# Patient Record
Sex: Female | Born: 1975 | Race: White | Hispanic: No | Marital: Married | Smoking: Former smoker
Health system: Southern US, Community
[De-identification: ages and names within clinical notes are randomized; demographics above are authoritative.]

## PROBLEM LIST (undated history)

## (undated) DIAGNOSIS — F419 Anxiety disorder, unspecified: Secondary | ICD-10-CM

## (undated) DIAGNOSIS — F329 Major depressive disorder, single episode, unspecified: Secondary | ICD-10-CM

## (undated) DIAGNOSIS — T7840XA Allergy, unspecified, initial encounter: Secondary | ICD-10-CM

## (undated) DIAGNOSIS — G4700 Insomnia, unspecified: Secondary | ICD-10-CM

## (undated) DIAGNOSIS — R87619 Unspecified abnormal cytological findings in specimens from cervix uteri: Secondary | ICD-10-CM

## (undated) DIAGNOSIS — Z974 Presence of external hearing-aid: Secondary | ICD-10-CM

## (undated) DIAGNOSIS — F909 Attention-deficit hyperactivity disorder, unspecified type: Secondary | ICD-10-CM

## (undated) DIAGNOSIS — F32A Depression, unspecified: Secondary | ICD-10-CM

## (undated) HISTORY — DX: Allergy, unspecified, initial encounter: T78.40XA

## (undated) HISTORY — DX: Attention-deficit hyperactivity disorder, unspecified type: F90.9

## (undated) HISTORY — PX: TONSILLECTOMY: SUR1361

## (undated) HISTORY — DX: Depression, unspecified: F32.A

## (undated) HISTORY — PX: LEEP: SHX91

## (undated) HISTORY — DX: Unspecified abnormal cytological findings in specimens from cervix uteri: R87.619

## (undated) HISTORY — DX: Major depressive disorder, single episode, unspecified: F32.9

## (undated) HISTORY — PX: CERVICAL BIOPSY  W/ LOOP ELECTRODE EXCISION: SUR135

## (undated) HISTORY — DX: Insomnia, unspecified: G47.00

## (undated) HISTORY — DX: Anxiety disorder, unspecified: F41.9

---

## 2012-09-18 ENCOUNTER — Ambulatory Visit: Payer: Self-pay

## 2013-03-21 ENCOUNTER — Ambulatory Visit: Payer: Self-pay

## 2014-04-16 ENCOUNTER — Ambulatory Visit: Payer: Self-pay

## 2015-03-19 ENCOUNTER — Other Ambulatory Visit: Payer: Self-pay | Admitting: Family Medicine

## 2015-03-19 ENCOUNTER — Other Ambulatory Visit: Payer: Self-pay | Admitting: Unknown Physician Specialty

## 2015-03-19 DIAGNOSIS — Z1231 Encounter for screening mammogram for malignant neoplasm of breast: Secondary | ICD-10-CM

## 2015-04-21 ENCOUNTER — Ambulatory Visit
Admission: RE | Admit: 2015-04-21 | Discharge: 2015-04-21 | Disposition: A | Discharge status: Home / Self Care | Payer: BLUE CROSS/BLUE SHIELD | Source: Ambulatory Visit | Attending: Family Medicine | Admitting: Family Medicine

## 2015-04-21 DIAGNOSIS — Z1231 Encounter for screening mammogram for malignant neoplasm of breast: Secondary | ICD-10-CM | POA: Diagnosis present

## 2015-05-12 ENCOUNTER — Other Ambulatory Visit: Payer: Self-pay | Admitting: Family Medicine

## 2015-05-13 NOTE — Telephone Encounter (Signed)
Call in rx  

## 2015-06-18 ENCOUNTER — Telehealth: Payer: Self-pay | Admitting: Family Medicine

## 2015-06-18 ENCOUNTER — Other Ambulatory Visit: Payer: Self-pay | Admitting: Family Medicine

## 2015-06-18 MED ORDER — AMPHETAMINE-DEXTROAMPHET ER 5 MG PO CP24: 5.0000 mg | ORAL_CAPSULE | Freq: Every day | ORAL | Status: DC

## 2015-06-18 MED ORDER — AMPHETAMINE-DEXTROAMPHETAMINE 5 MG PO TABS: 5.0000 mg | ORAL_TABLET | Freq: Every day | ORAL | Status: DC

## 2015-06-18 NOTE — Telephone Encounter (Signed)
Needs her adderall

## 2015-06-18 NOTE — Telephone Encounter (Signed)
printed

## 2015-06-26 DIAGNOSIS — G4700 Insomnia, unspecified: Secondary | ICD-10-CM | POA: Insufficient documentation

## 2015-06-26 DIAGNOSIS — F329 Major depressive disorder, single episode, unspecified: Secondary | ICD-10-CM | POA: Insufficient documentation

## 2015-06-26 DIAGNOSIS — F32A Depression, unspecified: Secondary | ICD-10-CM | POA: Insufficient documentation

## 2015-06-29 ENCOUNTER — Encounter: Payer: Self-pay | Admitting: Family Medicine

## 2015-06-29 ENCOUNTER — Ambulatory Visit (INDEPENDENT_AMBULATORY_CARE_PROVIDER_SITE_OTHER): Payer: Managed Care, Other (non HMO) | Admitting: Family Medicine

## 2015-06-29 VITALS — BP 139/82 | HR 82 | Temp 97.9°F | Ht 67.0 in | Wt 210.0 lb

## 2015-06-29 DIAGNOSIS — F32A Depression, unspecified: Secondary | ICD-10-CM

## 2015-06-29 DIAGNOSIS — F329 Major depressive disorder, single episode, unspecified: Secondary | ICD-10-CM

## 2015-06-29 DIAGNOSIS — F909 Attention-deficit hyperactivity disorder, unspecified type: Secondary | ICD-10-CM | POA: Diagnosis not present

## 2015-06-29 MED ORDER — IBUPROFEN 800 MG PO TABS: 800.0000 mg | ORAL_TABLET | Freq: Three times a day (TID) | ORAL | Status: DC | PRN

## 2015-06-29 NOTE — Assessment & Plan Note (Signed)
The current medical regimen is effective;  continue present plan and medications.  

## 2015-06-29 NOTE — Progress Notes (Addendum)
   BP 139/82 mmHg  Pulse 82  Temp(Src) 97.9 F (36.6 C)  Ht  (1.702 m)  Wt 210 lb (95.255 kg)  BMI 32.88 kg/m2  SpO2 99%  LMP 06/22/2015   Subjective:    Patient ID: Tammy Mays, female    DOB: Mar 16, 1976, 39 y.o.   MRN: [id number]  HPI: Tammy Mays is a 39 y.o. female  Chief Complaint  Patient presents with  . Depression  . ADHD  . spot on nose   Patient follow-up ADHD doing well said change of lifestyle was taking medications on a when necessary basis but now is taking on a regular basis. Is able to sleep okay. Using lorazepam on a when necessary basis has had a lot going on in her life recently and has been taking almost every night. Also having PMS symptoms pretty much predictably for 1 week of her period each month. Very emotional Rest of the month does fine.  Relevant past medical, surgical, family and social history reviewed and updated as indicated. Interim medical history since our last visit reviewed. Allergies and medications reviewed and updated.  Review of Systems  Constitutional: Negative.   Respiratory: Negative.   Cardiovascular: Negative.     Per HPI unless specifically indicated above     Objective:    BP 139/82 mmHg  Pulse 82  Temp(Src) 97.9 F (36.6 C)  Ht  (1.702 m)  Wt 210 lb (95.255 kg)  BMI 32.88 kg/m2  SpO2 99%  LMP 06/22/2015  Wt Readings from Last 3 Encounters:  06/29/15 210 lb (95.255 kg)  02/02/15 231 lb (104.781 kg)    Physical Exam  Constitutional: She is oriented to person, place, and time. She appears well-developed and well-nourished. No distress.  HENT:  Head: Normocephalic and atraumatic.  Right Ear: Hearing normal.  Left Ear: Hearing normal.  Nose: Nose normal.  Eyes: Conjunctivae and lids are normal. Right eye exhibits no discharge. Left eye exhibits no discharge. No scleral icterus.  Cardiovascular: Normal rate, regular rhythm and normal heart sounds.   Pulmonary/Chest: Effort normal and breath sounds  normal. No respiratory distress.  Musculoskeletal: Normal range of motion.  Neurological: She is alert and oriented to person, place, and time.  Skin: Skin is intact. No rash noted.  Psychiatric: She has a normal mood and affect. Her speech is normal and behavior is normal. Judgment and thought content normal. Cognition and memory are normal.    No results found for this or any previous visit.    Assessment & Plan:   Problem List Items Addressed This Visit      Other   Depression - Primary    The current medical regimen is effective;  continue present plan and medications.       Attention deficit hyperactivity disorder    The current medical regimen is effective;  continue present plan and medications.           Follow up plan: Return in about 3 months (around 09/29/2015), or if symptoms worsen or fail to improve, for f/u meds.

## 2015-06-29 NOTE — Addendum Note (Signed)
Addended byVonita Moss on: 06/29/2015 09:27 AM   Modules accepted: Orders

## 2015-07-03 ENCOUNTER — Other Ambulatory Visit: Payer: Self-pay | Admitting: Unknown Physician Specialty

## 2015-07-03 MED ORDER — AMPHETAMINE-DEXTROAMPHET ER 5 MG PO CP24: 5.0000 mg | ORAL_CAPSULE | Freq: Every day | ORAL | Status: DC

## 2015-07-03 MED ORDER — AMPHETAMINE-DEXTROAMPHETAMINE 5 MG PO TABS: 5.0000 mg | ORAL_TABLET | Freq: Every day | ORAL | Status: DC

## 2015-08-10 ENCOUNTER — Other Ambulatory Visit: Payer: Self-pay | Admitting: Family Medicine

## 2015-08-10 MED ORDER — AMPHETAMINE-DEXTROAMPHETAMINE 5 MG PO TABS: 5.0000 mg | ORAL_TABLET | Freq: Every day | ORAL | Status: DC

## 2015-08-10 MED ORDER — AMPHETAMINE-DEXTROAMPHET ER 5 MG PO CP24: 5.0000 mg | ORAL_CAPSULE | Freq: Every day | ORAL | Status: DC

## 2015-08-19 ENCOUNTER — Other Ambulatory Visit: Payer: Self-pay | Admitting: Family Medicine

## 2015-08-19 MED ORDER — LORAZEPAM 1 MG PO TABS: 1.0000 mg | ORAL_TABLET | Freq: Every day | ORAL | Status: DC | PRN

## 2015-09-08 ENCOUNTER — Encounter: Payer: Self-pay | Admitting: Sports Medicine

## 2015-09-08 ENCOUNTER — Ambulatory Visit (INDEPENDENT_AMBULATORY_CARE_PROVIDER_SITE_OTHER): Payer: 59

## 2015-09-08 ENCOUNTER — Other Ambulatory Visit: Payer: Self-pay | Admitting: Family Medicine

## 2015-09-08 ENCOUNTER — Ambulatory Visit (INDEPENDENT_AMBULATORY_CARE_PROVIDER_SITE_OTHER): Payer: 59 | Admitting: Sports Medicine

## 2015-09-08 DIAGNOSIS — M722 Plantar fascial fibromatosis: Secondary | ICD-10-CM

## 2015-09-08 DIAGNOSIS — M79673 Pain in unspecified foot: Secondary | ICD-10-CM

## 2015-09-08 MED ORDER — AMPHETAMINE-DEXTROAMPHETAMINE 5 MG PO TABS: 5.0000 mg | ORAL_TABLET | Freq: Every day | ORAL | Status: DC

## 2015-09-08 MED ORDER — AMPHETAMINE-DEXTROAMPHET ER 5 MG PO CP24: 5.0000 mg | ORAL_CAPSULE | Freq: Every day | ORAL | Status: DC

## 2015-09-08 NOTE — Progress Notes (Signed)
Patient ID: Tammy Mays, female   DOB: 11/05/1975, 39 y.o.   MRN: [id number] Subjective: Tammy Mays is a 39 y.o. female patient presents to office with complaint of heel pain on the left>right. Patient admits to post static dyskinesia for 8 weeks in duration gradually after training run. Patient has treated this problem with boots and superfeet. Patient reports that this treatment has helped. Noticed that with her new brooks sneakers the pain is made worse; thinks that these sneakers may have started this after the run. States that pain isn't too bad; able to work with no problems however pain in occasional pain in the morning. Denies any other pedal complaints. Denies hip, knee, back problems.   Patient Active Problem List   Diagnosis Date Noted  . Attention deficit hyperactivity disorder 06/29/2015  . Depression   . Insomnia    Current Outpatient Prescriptions on File Prior to Visit  Medication Sig Dispense Refill  . buPROPion (WELLBUTRIN SR) 150 MG 12 hr tablet TK 1 T PO BID  2  . ibuprofen (ADVIL,MOTRIN) 800 MG tablet Take 1 tablet (800 mg total) by mouth every 8 (eight) hours as needed. 90 tablet 3  . LORazepam (ATIVAN) 1 MG tablet Take 1 tablet (1 mg total) by mouth daily as needed. 30 tablet 2   No current facility-administered medications on file prior to visit.   Allergies  Allergen Reactions  . Effexor [Venlafaxine]      Objective: Physical Exam General: The patient is alert and oriented x3 in no acute distress.  Dermatology: Skin is warm, dry and supple bilateral lower extremities. Nails 1-10 are normal. There is no erythema, edema, no eccymosis, no open lesions present. Integument is otherwise unremarkable.  Vascular: Dorsalis Pedis pulse and Posterior Tibial pulse are 2/4 bilateral. Capillary fill time is immediate to all digits.  Neurological: Grossly intact to light touch with an achilles reflex of +2/5 and a  negative Tinel's sign bilateral.  Musculoskeletal:  Tenderness to palpation at the medial calcaneal tubercale and through the insertion of the plantar fascia on the left>right foot. No pain with compression of calcaneus bilateral. No pain with tuning fork to calcaneus bilateral. No pain with calf compression bilateral. There is mild decreased Ankle joint range of motion left>right. All other joints range of motion within normal limits bilateral. Strength 5/5 in all groups bilateral.   Gait: Unassisted, Non-Antalgic  Xray, Right & Left foot: 3 Views Normal osseous mineralization. Joint spaces preserved. No fracture/dislocation/boney destruction. Calcaneal spur present with mild thickening of plantar fascia L>R. No other soft tissue abnormalities or radiopaque foreign bodies.   Assessment and Plan: Problem List Items Addressed This Visit    None    Visit Diagnoses    Foot pain, unspecified laterality    -  Primary    Relevant Orders    DG Foot Complete Left    DG Foot Complete Right    Plantar fasciitis, bilateral        L>R      -Complete examination performed. Discussed with patient in detail the condition of plantar fasciitis, how this occurs and general treatment options. Explained both conservative and surgical treatments.  -Patient declined steroid injection at today's visit.  -Patient declined oral steroids and anti-inflammatories at today's visit. Patient desires to continue with Motrin as needed.  -Recommended good supportive shoes and advised use of OTC insert in all shoes. Explained to patient that if these orthoses work well, we will continue with these. If these do  not improve her condition and  pain, we will consider custom molded orthoses. -Explained in detail the use of the fascial brace &  night splint which was dispensed at today's visit. -Explained and dispensed to patient daily stretching exercises. -Recommend patient to ice affected area 1-2x daily. -Patient to return to office in 3-4 weeks for follow up or sooner if  problems or questions  Arise.  Asencion Islam, DPM

## 2015-09-08 NOTE — Patient Instructions (Signed)

## 2015-09-10 ENCOUNTER — Ambulatory Visit (INDEPENDENT_AMBULATORY_CARE_PROVIDER_SITE_OTHER): Payer: Managed Care, Other (non HMO) | Admitting: Family Medicine

## 2015-09-10 ENCOUNTER — Encounter: Payer: Self-pay | Admitting: Family Medicine

## 2015-09-10 VITALS — BP 131/87 | HR 73 | Temp 98.3°F | Ht 67.3 in | Wt 237.0 lb

## 2015-09-10 DIAGNOSIS — F909 Attention-deficit hyperactivity disorder, unspecified type: Secondary | ICD-10-CM

## 2015-09-10 DIAGNOSIS — F32A Depression, unspecified: Secondary | ICD-10-CM

## 2015-09-10 DIAGNOSIS — F329 Major depressive disorder, single episode, unspecified: Secondary | ICD-10-CM | POA: Diagnosis not present

## 2015-09-10 MED ORDER — BUPROPION HCL ER (SR) 150 MG PO TB12: 150.0000 mg | ORAL_TABLET | Freq: Two times a day (BID) | ORAL | Status: DC

## 2015-09-10 NOTE — Assessment & Plan Note (Signed)
The current medical regimen is effective;  continue present plan and medications.  

## 2015-09-10 NOTE — Progress Notes (Signed)
   BP 131/87 mmHg  Pulse 73  Temp(Src) 98.3 F (36.8 C)  Ht 5' 7.3" (1.709 m)  Wt 237 lb (107.502 kg)  BMI 36.81 kg/m2  LMP 09/05/2015 (Approximate)   Subjective:    Patient ID: Tammy Mays, female    DOB: [birth year], 39 y.o.   MRN: [id number]  HPI: Tammy Mays is a 39 y.o. female  Chief Complaint  Patient presents with  . ADHD  . Depression   patient doing so much better started an exercise program on a regular basis is sleeping better more energy and less anxiety Continuing to take Adderall 5 mg each morning Rare use of lorazepam Depression doing well on bupropion twice a day Has not made anxiety worse actually is much better.  Relevant past medical, surgical, family and social history reviewed and updated as indicated. Interim medical history since our last visit reviewed. Allergies and medications reviewed and updated.  Review of Systems  Constitutional: Negative.   Respiratory: Negative.   Cardiovascular: Negative.     Per HPI unless specifically indicated above     Objective:    BP 131/87 mmHg  Pulse 73  Temp(Src) 98.3 F (36.8 C)  Ht 5' 7.3" (1.709 m)  Wt 237 lb (107.502 kg)  BMI 36.81 kg/m2  LMP 09/05/2015 (Approximate)  Wt Readings from Last 3 Encounters:  09/10/15 237 lb (107.502 kg)  06/29/15 210 lb (95.255 kg)  02/02/15 231 lb (104.781 kg)    Physical Exam  Constitutional: She is oriented to person, place, and time. She appears well-developed and well-nourished. No distress.  HENT:  Head: Normocephalic and atraumatic.  Right Ear: Hearing normal.  Left Ear: Hearing normal.  Nose: Nose normal.  Eyes: Conjunctivae and lids are normal. Right eye exhibits no discharge. Left eye exhibits no discharge. No scleral icterus.  Cardiovascular: Normal rate, regular rhythm and normal heart sounds.   Pulmonary/Chest: Effort normal and breath sounds normal. No respiratory distress.  Musculoskeletal: Normal range of motion.  Neurological: She is alert and  oriented to person, place, and time.  Skin: Skin is intact. No rash noted.  Psychiatric: She has a normal mood and affect. Her speech is normal and behavior is normal. Judgment and thought content normal. Cognition and memory are normal.    No results found for this or any previous visit.    Assessment & Plan:   Problem List Items Addressed This Visit      Other   Depression - Primary    The current medical regimen is effective;  continue present plan and medications.       Relevant Medications   buPROPion (WELLBUTRIN SR) 150 MG 12 hr tablet   Attention deficit hyperactivity disorder    The current medical regimen is effective;  continue present plan and medications.           Follow up plan: Return in about 3 months (around 12/11/2015) for med check.

## 2015-09-23 ENCOUNTER — Other Ambulatory Visit: Payer: Self-pay | Admitting: Family Medicine

## 2015-09-23 MED ORDER — AMPHETAMINE-DEXTROAMPHETAMINE 5 MG PO TABS: 5.0000 mg | ORAL_TABLET | Freq: Every day | ORAL | Status: DC

## 2015-09-23 MED ORDER — AMPHETAMINE-DEXTROAMPHET ER 5 MG PO CP24: 5.0000 mg | ORAL_CAPSULE | Freq: Every day | ORAL | Status: DC

## 2015-09-30 ENCOUNTER — Encounter: Payer: Self-pay | Admitting: Family Medicine

## 2015-09-30 ENCOUNTER — Ambulatory Visit (INDEPENDENT_AMBULATORY_CARE_PROVIDER_SITE_OTHER): Payer: Managed Care, Other (non HMO) | Admitting: Family Medicine

## 2015-09-30 VITALS — BP 144/92 | HR 63 | Temp 97.9°F | Wt 219.0 lb

## 2015-09-30 DIAGNOSIS — J069 Acute upper respiratory infection, unspecified: Secondary | ICD-10-CM | POA: Diagnosis not present

## 2015-09-30 DIAGNOSIS — J208 Acute bronchitis due to other specified organisms: Secondary | ICD-10-CM | POA: Diagnosis not present

## 2015-09-30 DIAGNOSIS — IMO0001 Reserved for inherently not codable concepts without codable children: Secondary | ICD-10-CM

## 2015-09-30 DIAGNOSIS — R030 Elevated blood-pressure reading, without diagnosis of hypertension: Secondary | ICD-10-CM | POA: Diagnosis not present

## 2015-09-30 DIAGNOSIS — B9789 Other viral agents as the cause of diseases classified elsewhere: Secondary | ICD-10-CM

## 2015-09-30 MED ORDER — BENZONATATE 100 MG PO CAPS: 100.0000 mg | ORAL_CAPSULE | Freq: Three times a day (TID) | ORAL | Status: AC | PRN

## 2015-09-30 NOTE — Progress Notes (Signed)
BP 144/92 mmHg  Pulse 63  Temp(Src) 97.9 F (36.6 C)  Wt 219 lb (99.338 kg)  SpO2 99%  LMP 09/05/2015 (Approximate)   Subjective:    Patient ID: Tammy Mays, female    DOB: 1975/12/13, 39 y.o.   MRN: [id number]  HPI: Tammy Mays is a 39 y.o. female  Chief Complaint  Patient presents with  . URI    started last week, coughing, feels horrible, maybe fever (not been checked), some congestion. The cough is the worst. sore throat.   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  Review of Systems Per HPI unless specifically indicated above     Objective:    BP 144/92 mmHg  Pulse 63  Temp(Src) 97.9 F (36.6 C)  Wt 219 lb (99.338 kg)  SpO2 99%  LMP 09/05/2015 (Approximate)  Wt Readings from Last 3 Encounters:  09/30/15 219 lb (99.338 kg)  09/10/15 237 lb (107.502 kg)  06/29/15 210 lb (95.255 kg)    Physical Exam  Constitutional: She appears well-developed and well-nourished.  Non-toxic appearance. She does not have a sickly appearance. She does not appear ill. No distress.  Fluctuating weight over last few months noted; obese  HENT:  Right Ear: Hearing, external ear and ear canal normal. Tympanic membrane is scarred. Tympanic membrane is not erythematous.  Left Ear: Hearing, external ear and ear canal  normal. Tympanic membrane is not erythematous. A middle ear effusion (scant, serous) is present.  Nose: Rhinorrhea (scant clear) present. No mucosal edema.  Mouth/Throat: Oropharynx is clear and moist and mucous membranes are normal. No oropharyngeal exudate, posterior oropharyngeal edema or posterior oropharyngeal erythema.  Cardiovascular: Normal rate and regular rhythm.   Pulmonary/Chest: Effort normal and breath sounds normal.  Lymphadenopathy:    She has cervical adenopathy (shoddy).  Skin: No rash noted.    No results found for this or any previous visit.    Assessment & Plan:   Problem List Items Addressed This Visit      Other   Elevated BP    Likely related to cough/cold medicine she is taking; see AVS; check BP, notify us if over target; try DASH guidelines       Other Visit Diagnoses    Acute viral bronchitis    -  Primary    symptomatic care, rest, hydration; antibiotics not indicated; watch for s/s of secondary infection; out of work today and tomorrow    Viral upper respiratory tract infection with cough        antibiotics not indicated; rest, hydration, see AVS; out of work today and tomorrow; tessalon perles for cough        Follow up plan: Return if symptoms worsen or fail to improve.  Meds ordered this encounter  Medications  . benzonatate (  TESSALON) 100 MG capsule    Sig: Take 1 capsule (100 mg total) by mouth 3 (three) times daily as needed for cough.    Dispense:  30 capsule    Refill:  0   An after-visit summary was printed and given to the patient at check-out.  Please see the patient instructions which may contain other information and recommendations beyond what is mentioned above in the assessment and plan.

## 2015-09-30 NOTE — Assessment & Plan Note (Signed)
Likely related to cough/cold medicine she is taking; see AVS; check BP, notify us if over target; try DASH guidelines

## 2015-09-30 NOTE — Patient Instructions (Addendum)
Tammy Mays -- please write work note, out today and tomorrow Try vitamin C (orange juice if not diabetic or vitamin C tablets) and drink green tea to help your immune system during your illness Get plenty of rest and hydration Use the new cough medicine pills as directed if you need them Watch for secondary infection and call right away with any problems Check BP at work occasionally and let us know if over 140/90 Try to use PLAIN allergy / cold medicine without the decongestant Avoid: phenylephrine, phenylpropanolamine, and pseudoephredine Try the DASH guidelines for blood pressure  DASH Eating Plan DASH stands for "Dietary Approaches to Stop Hypertension." The DASH eating plan is a healthy eating plan that has been shown to reduce high blood pressure (hypertension). Additional health benefits may include reducing the risk of type 2 diabetes mellitus, heart disease, and stroke. The DASH eating plan may also help with weight loss. WHAT DO I NEED TO KNOW ABOUT THE DASH EATING PLAN? For the DASH eating plan, you will follow these general guidelines:  Choose foods with a percent daily value for sodium of less than 5% (as listed on the food label).  Use salt-free seasonings or herbs instead of table salt or sea salt.  Check with your health care provider or pharmacist before using salt substitutes.  Eat lower-sodium products, often labeled as "lower sodium" or "no salt added."  Eat fresh foods.  Eat more vegetables, fruits, and low-fat dairy products.  Choose whole grains. Look for the word "whole" as the first word in the ingredient list.  Choose fish and skinless chicken or [place] more often than red meat. Limit fish, poultry, and meat to 6 oz (170 g) each day.  Limit sweets, desserts, sugars, and sugary drinks.  Choose heart-healthy fats.  Limit cheese to 1 oz (28 g) per day.  Eat more home-cooked food and less restaurant, buffet, and fast food.  Limit fried foods.  Cook foods using  methods other than frying.  Limit canned vegetables. If you do use them, rinse them well to decrease the sodium.  When eating at a restaurant, ask that your food be prepared with less salt, or no salt if possible. WHAT FOODS CAN I EAT? Seek help from a dietitian for individual calorie needs. Grains Whole grain or whole wheat bread. Brown rice. Whole grain or whole wheat pasta. Quinoa, bulgur, and whole grain cereals. Low-sodium cereals. Corn or whole wheat flour tortillas. Whole grain cornbread. Whole grain crackers. Low-sodium crackers. Vegetables Fresh or frozen vegetables (raw, steamed, roasted, or grilled). Low-sodium or reduced-sodium tomato and vegetable juices. Low-sodium or reduced-sodium tomato sauce and paste. Low-sodium or reduced-sodium canned vegetables.  Fruits All fresh, canned (in natural juice), or frozen fruits. Meat and Other Protein Products Ground beef (85% or leaner), grass-fed beef, or beef trimmed of fat. Skinless chicken or [place]. Ground chicken or [place]key. Pork trimmed of fat. All fish and seafood. Eggs. Dried beans, peas, or lentils. Unsalted nuts and seeds. Unsalted canned beans. Dairy Low-fat dairy products, such as skim or 1% milk, 2% or reduced-fat cheeses, low-fat ricotta or cottage cheese, or plain low-fat yogurt. Low-sodium or reduced-sodium cheeses. Fats and Oils Tub margarines without trans fats. Light or reduced-fat mayonnaise and salad dressings (reduced sodium). Avocado. Safflower, olive, or canola oils. Natural peanut or almond butter. Other Unsalted popcorn and pretzels. The items listed above may not be a complete list of recommended foods or beverages. Contact your dietitian for more options. WHAT FOODS ARE NOT RECOMMENDED? Grains White  bread. White pasta. White rice. Refined cornbread. Bagels and croissants. Crackers that contain trans fat. Vegetables Creamed or fried vegetables. Vegetables in a cheese sauce. Regular canned vegetables. Regular  canned tomato sauce and paste. Regular tomato and vegetable juices. Fruits Dried fruits. Canned fruit in light or heavy syrup. Fruit juice. Meat and Other Protein Products Fatty cuts of meat. Ribs, chicken wings, bacon, sausage, bologna, salami, chitterlings, fatback, hot dogs, bratwurst, and packaged luncheon meats. Salted nuts and seeds. Canned beans with salt. Dairy Whole or 2% milk, cream, half-and-half, and cream cheese. Whole-fat or sweetened yogurt. Full-fat cheeses or blue cheese. Nondairy creamers and whipped toppings. Processed cheese, cheese spreads, or cheese curds. Condiments Onion and garlic salt, seasoned salt, table salt, and sea salt. Canned and packaged gravies. Worcestershire sauce. Tartar sauce. Barbecue sauce. Teriyaki sauce. Soy sauce, including reduced sodium. Steak sauce. Fish sauce. Oyster sauce. Cocktail sauce. Horseradish. Ketchup and mustard. Meat flavorings and tenderizers. Bouillon cubes. Hot sauce. Tabasco sauce. Marinades. Taco seasonings. Relishes. Fats and Oils Butter, stick margarine, lard, shortening, ghee, and bacon fat. Coconut, palm kernel, or palm oils. Regular salad dressings. Other Pickles and olives. Salted popcorn and pretzels. The items listed above may not be a complete list of foods and beverages to avoid. Contact your dietitian for more information. WHERE CAN I FIND MORE INFORMATION? National Heart, Lung, and Blood Institute: [employer]   This information is not intended to replace advice given to you by your health care provider. Make sure you discuss any questions you have with your health care provider.   Document Released: 10/06/2011 Document Revised: 11/07/2014 Document Reviewed: 08/21/2013 Elsevier Interactive Patient Education [employer]

## 2015-10-06 ENCOUNTER — Other Ambulatory Visit: Payer: Self-pay | Admitting: Family Medicine

## 2015-10-06 ENCOUNTER — Ambulatory Visit: Payer: 59 | Admitting: Sports Medicine

## 2015-10-06 MED ORDER — HYDROCOD POLST-CPM POLST ER 10-8 MG/5ML PO SUER: 2.5000 mL | Freq: Two times a day (BID) | ORAL | Status: DC | PRN

## 2015-11-28 ENCOUNTER — Other Ambulatory Visit: Payer: Self-pay | Admitting: Family Medicine

## 2015-11-30 ENCOUNTER — Other Ambulatory Visit: Payer: Self-pay | Admitting: Family Medicine

## 2015-11-30 NOTE — Telephone Encounter (Signed)
Call in 

## 2015-12-01 ENCOUNTER — Other Ambulatory Visit: Payer: Self-pay | Admitting: Family Medicine

## 2015-12-01 MED ORDER — AMPHETAMINE-DEXTROAMPHET ER 5 MG PO CP24: 5.0000 mg | ORAL_CAPSULE | Freq: Every day | ORAL | Status: DC

## 2015-12-01 MED ORDER — AMPHETAMINE-DEXTROAMPHETAMINE 5 MG PO TABS: 5.0000 mg | ORAL_TABLET | Freq: Every day | ORAL | Status: DC

## 2015-12-14 ENCOUNTER — Ambulatory Visit: Payer: Managed Care, Other (non HMO) | Admitting: Family Medicine

## 2015-12-22 ENCOUNTER — Encounter: Payer: Self-pay | Admitting: Family Medicine

## 2015-12-22 ENCOUNTER — Ambulatory Visit (INDEPENDENT_AMBULATORY_CARE_PROVIDER_SITE_OTHER): Payer: Managed Care, Other (non HMO) | Admitting: Family Medicine

## 2015-12-22 ENCOUNTER — Other Ambulatory Visit: Payer: Self-pay | Admitting: Family Medicine

## 2015-12-22 VITALS — BP 131/90 | HR 66 | Temp 98.1°F | Ht 67.1 in | Wt 234.0 lb

## 2015-12-22 DIAGNOSIS — F329 Major depressive disorder, single episode, unspecified: Secondary | ICD-10-CM

## 2015-12-22 DIAGNOSIS — F32A Depression, unspecified: Secondary | ICD-10-CM

## 2015-12-22 DIAGNOSIS — G4700 Insomnia, unspecified: Secondary | ICD-10-CM

## 2015-12-22 DIAGNOSIS — F909 Attention-deficit hyperactivity disorder, unspecified type: Secondary | ICD-10-CM

## 2015-12-22 MED ORDER — AMPHETAMINE-DEXTROAMPHETAMINE 5 MG PO TABS: 5.0000 mg | ORAL_TABLET | Freq: Every day | ORAL | Status: DC

## 2015-12-22 MED ORDER — AMPHETAMINE-DEXTROAMPHET ER 5 MG PO CP24: 5.0000 mg | ORAL_CAPSULE | Freq: Every day | ORAL | Status: DC

## 2015-12-22 NOTE — Assessment & Plan Note (Signed)
The current medical regimen is effective;  continue present plan and medications.  

## 2015-12-22 NOTE — Progress Notes (Signed)
   BP 131/90 mmHg  Pulse 66  Temp(Src) 98.1 F (36.7 C)  Ht 5' 7.1" (1.704 m)  Wt 234 lb (106.142 kg)  BMI 36.56 kg/m2  SpO2 98%  LMP 11/30/2015 (Approximate)   Subjective:    Patient ID: Tammy Mays, female    DOB: [birth year], 40 y.o.   MRN: [id number]  HPI: Tammy Mays is a 40 y.o. female  Chief Complaint  Patient presents with  . Depression  . ADHD   Patient doing really well has started an exercise program which allows her to take less medication. Taking bupropion at night and doing well has occasional lorazepam Depression is well controlled along with ADHD especially with exercise program Relevant past medical, surgical, family and social history reviewed and updated as indicated. Interim medical history since our last visit reviewed. Allergies and medications reviewed and updated.  Review of Systems  Constitutional: Negative.   Respiratory: Negative.   Cardiovascular: Negative.     Per HPI unless specifically indicated above     Objective:    BP 131/90 mmHg  Pulse 66  Temp(Src) 98.1 F (36.7 C)  Ht 5' 7.1" (1.704 m)  Wt 234 lb (106.142 kg)  BMI 36.56 kg/m2  SpO2 98%  LMP 11/30/2015 (Approximate)  Wt Readings from Last 3 Encounters:  12/22/15 234 lb (106.142 kg)  09/30/15 219 lb (99.338 kg)  09/10/15 237 lb (107.502 kg)    Physical Exam  Constitutional: She is oriented to person, place, and time. She appears well-developed and well-nourished. No distress.  HENT:  Head: Normocephalic and atraumatic.  Right Ear: Hearing normal.  Left Ear: Hearing normal.  Nose: Nose normal.  Eyes: Conjunctivae and lids are normal. Right eye exhibits no discharge. Left eye exhibits no discharge. No scleral icterus.  Cardiovascular: Normal rate, regular rhythm and normal heart sounds.   Pulmonary/Chest: Effort normal and breath sounds normal. No respiratory distress.  Musculoskeletal: Normal range of motion.  Neurological: She is alert and oriented to person, place, and  time.  Skin: Skin is intact. No rash noted.  Psychiatric: She has a normal mood and affect. Her speech is normal and behavior is normal. Judgment and thought content normal. Cognition and memory are normal.    No results found for this or any previous visit.    Assessment & Plan:   Problem List Items Addressed This Visit      Other   Insomnia    The current medical regimen is effective;  continue present plan and medications.       Depression - Primary    The current medical regimen is effective;  continue present plan and medications.       Attention deficit hyperactivity disorder    The current medical regimen is effective;  continue present plan and medications.           Follow up plan: Return for Physical Exam in March.

## 2016-01-21 ENCOUNTER — Encounter: Payer: Managed Care, Other (non HMO) | Admitting: Family Medicine

## 2016-01-26 ENCOUNTER — Ambulatory Visit (INDEPENDENT_AMBULATORY_CARE_PROVIDER_SITE_OTHER): Payer: Managed Care, Other (non HMO) | Admitting: Family Medicine

## 2016-01-26 ENCOUNTER — Encounter: Payer: Self-pay | Admitting: Family Medicine

## 2016-01-26 VITALS — BP 125/84 | HR 88 | Temp 98.4°F | Ht 66.4 in | Wt 238.0 lb

## 2016-01-26 DIAGNOSIS — M722 Plantar fascial fibromatosis: Secondary | ICD-10-CM | POA: Diagnosis not present

## 2016-01-26 DIAGNOSIS — Z0000 Encounter for general adult medical examination without abnormal findings: Secondary | ICD-10-CM | POA: Diagnosis not present

## 2016-01-26 LAB — URINALYSIS, ROUTINE W REFLEX MICROSCOPIC
BILIRUBIN UA: NEGATIVE
Glucose, UA: NEGATIVE
NITRITE UA: NEGATIVE
PH UA: 6 (ref 5.0–7.5)
Protein, UA: NEGATIVE
SPEC GRAV UA: 1.02 (ref 1.005–1.030)
Urobilinogen, Ur: 0.2 mg/dL (ref 0.2–1.0)

## 2016-01-26 LAB — MICROSCOPIC EXAMINATION

## 2016-01-26 NOTE — Progress Notes (Signed)
   BP 125/84 mmHg  Pulse 88  Temp(Src) 98.4 F (36.9 C)  Ht 5' 6.4" (1.687 m)  Wt 238 lb (107.956 kg)  BMI 37.93 kg/m2  SpO2 99%  LMP 01/24/2016 (Exact Date)   Subjective:    Patient ID: Tammy Mays, female    DOB: 18-Aug-1976, 40 y.o.   MRN: [id number]  HPI: Tammy Mays is a 40 y.o. female  Chief Complaint  Patient presents with  . Annual Exam  . Foot Pain    Relevant past medical, surgical, family and social history reviewed and updated as indicated. Interim medical history since our last visit reviewed. Allergies and medications reviewed and updated.  Review of Systems  Constitutional: Negative.   HENT: Negative.   Eyes: Negative.   Respiratory: Negative.   Cardiovascular: Negative.   Gastrointestinal: Negative.   Endocrine: Negative.   Genitourinary: Negative.   Musculoskeletal: Negative.   Skin: Negative.   Allergic/Immunologic: Negative.   Neurological: Negative.   Hematological: Negative.   Psychiatric/Behavioral: Negative.     Per HPI unless specifically indicated above     Objective:    BP 125/84 mmHg  Pulse 88  Temp(Src) 98.4 F (36.9 C)  Ht 5' 6.4" (1.687 m)  Wt 238 lb (107.956 kg)  BMI 37.93 kg/m2  SpO2 99%  LMP 01/24/2016 (Exact Date)  Wt Readings from Last 3 Encounters:  01/26/16 238 lb (107.956 kg)  12/22/15 234 lb (106.142 kg)  09/30/15 219 lb (99.338 kg)    Physical Exam  Constitutional: She is oriented to person, place, and time. She appears well-developed and well-nourished.  HENT:  Head: Normocephalic and atraumatic.  Right Ear: External ear normal.  Left Ear: External ear normal.  Nose: Nose normal.  Mouth/Throat: Oropharynx is clear and moist.  Eyes: Conjunctivae and EOM are normal. Pupils are equal, round, and reactive to light.  Neck: Normal range of motion. Neck supple. Carotid bruit is not present.  Cardiovascular: Normal rate, regular rhythm and normal heart sounds.   No murmur heard. Pulmonary/Chest: Effort normal and  breath sounds normal. She exhibits no mass. Right breast exhibits no mass, no skin change and no tenderness. Left breast exhibits no mass, no skin change and no tenderness. Breasts are symmetrical.  Abdominal: Soft. Bowel sounds are normal. There is no hepatosplenomegaly.  Musculoskeletal: Normal range of motion.  Neurological: She is alert and oriented to person, place, and time.  Skin: No rash noted.  Psychiatric: She has a normal mood and affect. Her behavior is normal. Judgment and thought content normal.   patient had breast exam and Pap smear done at GYN  No results found for this or any previous visit.    Assessment & Plan:   Problem List Items Addressed This Visit      Musculoskeletal and Integument   Plantar fasciitis, bilateral    Discussed plantar fasciitis care and treatment patient's been to podiatry       Other Visit Diagnoses    Routine general medical examination at a health care facility    -  Primary    Relevant Orders    CBC with Differential/Platelet    Comprehensive metabolic panel    Lipid Panel w/o Chol/HDL Ratio    Urinalysis, Routine w reflex microscopic (not at [facility])    TSH        Follow up plan: Return in about 3 months (around 04/27/2016) for ADHD follow-up.

## 2016-01-26 NOTE — Assessment & Plan Note (Signed)
Discussed plantar fasciitis care and treatment patient's been to podiatry

## 2016-01-27 ENCOUNTER — Encounter: Payer: Self-pay | Admitting: Family Medicine

## 2016-01-27 LAB — LIPID PANEL W/O CHOL/HDL RATIO
CHOLESTEROL TOTAL: 139 mg/dL (ref 100–199)
HDL: 55 mg/dL (ref >39)
LDL CALC: 63 mg/dL (ref 0–99)
TRIGLYCERIDES: 103 mg/dL (ref 0–149)
VLDL Cholesterol Cal: 21 mg/dL (ref 5–40)

## 2016-01-27 LAB — CBC WITH DIFFERENTIAL/PLATELET
BASOS: 0 %
Basophils Absolute: 0 10*3/uL (ref 0.0–0.2)
EOS (ABSOLUTE): 0.5 10*3/uL — ABNORMAL HIGH (ref 0.0–0.4)
EOS: 6 %
HEMATOCRIT: 39.7 % (ref 34.0–46.6)
HEMOGLOBIN: 13.7 g/dL (ref 11.1–15.9)
IMMATURE GRANULOCYTES: 0 %
Immature Grans (Abs): 0 10*3/uL (ref 0.0–0.1)
LYMPHS ABS: 2.6 10*3/uL (ref 0.7–3.1)
Lymphs: 30 %
MCH: 30.6 pg (ref 26.6–33.0)
MCHC: 34.5 g/dL (ref 31.5–35.7)
MCV: 89 fL (ref 79–97)
MONOCYTES: 9 %
Monocytes Absolute: 0.8 10*3/uL (ref 0.1–0.9)
NEUTROS PCT: 55 %
Neutrophils Absolute: 4.7 10*3/uL (ref 1.4–7.0)
Platelets: 274 10*3/uL (ref 150–379)
RBC: 4.48 x10E6/uL (ref 3.77–5.28)
RDW: 13.9 % (ref 12.3–15.4)
WBC: 8.6 10*3/uL (ref 3.4–10.8)

## 2016-01-27 LAB — COMPREHENSIVE METABOLIC PANEL
A/G RATIO: 1.8 (ref 1.2–2.2)
ALBUMIN: 4.2 g/dL (ref 3.5–5.5)
ALT: 13 IU/L (ref 0–32)
AST: 15 IU/L (ref 0–40)
Alkaline Phosphatase: 70 IU/L (ref 39–117)
BUN / CREAT RATIO: 15 (ref 9–23)
BUN: 12 mg/dL (ref 6–24)
Bilirubin Total: 0.4 mg/dL (ref 0.0–1.2)
CALCIUM: 9 mg/dL (ref 8.7–10.2)
CO2: 24 mmol/L (ref 18–29)
CREATININE: 0.82 mg/dL (ref 0.57–1.00)
Chloride: 103 mmol/L (ref 96–106)
GFR, EST AFRICAN AMERICAN: 103 mL/min/{1.73_m2} (ref >59)
GFR, EST NON AFRICAN AMERICAN: 89 mL/min/{1.73_m2} (ref >59)
GLOBULIN, TOTAL: 2.4 g/dL (ref 1.5–4.5)
Glucose: 72 mg/dL (ref 65–99)
POTASSIUM: 3.9 mmol/L (ref 3.5–5.2)
SODIUM: 143 mmol/L (ref 134–144)
Total Protein: 6.6 g/dL (ref 6.0–8.5)

## 2016-01-27 LAB — TSH: TSH: 1.36 u[IU]/mL (ref 0.450–4.500)

## 2016-02-15 ENCOUNTER — Other Ambulatory Visit: Payer: Self-pay | Admitting: Family Medicine

## 2016-02-15 MED ORDER — AMPHETAMINE-DEXTROAMPHETAMINE 5 MG PO TABS: 5.0000 mg | ORAL_TABLET | Freq: Every day | ORAL | Status: DC

## 2016-02-15 MED ORDER — AMPHETAMINE-DEXTROAMPHET ER 5 MG PO CP24: 5.0000 mg | ORAL_CAPSULE | Freq: Every day | ORAL | Status: DC

## 2016-02-26 ENCOUNTER — Other Ambulatory Visit: Payer: Self-pay | Admitting: Family Medicine

## 2016-02-29 ENCOUNTER — Other Ambulatory Visit: Payer: Self-pay | Admitting: Family Medicine

## 2016-02-29 DIAGNOSIS — Z0289 Encounter for other administrative examinations: Secondary | ICD-10-CM

## 2016-02-29 NOTE — Telephone Encounter (Signed)
Fax rx

## 2016-03-01 ENCOUNTER — Telehealth: Payer: Self-pay | Admitting: Family Medicine

## 2016-03-01 NOTE — Telephone Encounter (Signed)
Pt is going to an academy and she has to have injections done and would like a call back to discuss what she can have done through the county.

## 2016-03-01 NOTE — Telephone Encounter (Signed)
Patient is having the [place] Nurse check to see if she has MMR, Meningitis, TB skin, Tdap and Hep A Vaccines  If available will have done throught the city and will bring documentation here for paperwork.  If not patient will come get vaccines here.

## 2016-03-14 ENCOUNTER — Other Ambulatory Visit: Payer: Self-pay | Admitting: Family Medicine

## 2016-03-14 MED ORDER — AMPHETAMINE-DEXTROAMPHET ER 5 MG PO CP24: 5.0000 mg | ORAL_CAPSULE | Freq: Every day | ORAL | Status: DC

## 2016-03-14 MED ORDER — AMPHETAMINE-DEXTROAMPHETAMINE 5 MG PO TABS: 5.0000 mg | ORAL_TABLET | Freq: Every day | ORAL | Status: DC

## 2016-04-06 ENCOUNTER — Other Ambulatory Visit: Payer: Self-pay | Admitting: Family Medicine

## 2016-04-06 DIAGNOSIS — Z1231 Encounter for screening mammogram for malignant neoplasm of breast: Secondary | ICD-10-CM

## 2016-04-17 ENCOUNTER — Other Ambulatory Visit: Payer: Self-pay | Admitting: Family Medicine

## 2016-04-17 MED ORDER — AMPHETAMINE-DEXTROAMPHET ER 5 MG PO CP24: 5.0000 mg | ORAL_CAPSULE | Freq: Every day | ORAL | Status: DC

## 2016-04-17 MED ORDER — AMPHETAMINE-DEXTROAMPHETAMINE 5 MG PO TABS: 5.0000 mg | ORAL_TABLET | Freq: Every day | ORAL | Status: DC

## 2016-04-20 ENCOUNTER — Other Ambulatory Visit: Payer: Self-pay | Admitting: Family Medicine

## 2016-04-20 MED ORDER — AMPHETAMINE-DEXTROAMPHET ER 5 MG PO CP24: 5.0000 mg | ORAL_CAPSULE | Freq: Every day | ORAL | Status: DC

## 2016-04-20 MED ORDER — AMPHETAMINE-DEXTROAMPHETAMINE 5 MG PO TABS: 5.0000 mg | ORAL_TABLET | Freq: Every day | ORAL | Status: DC

## 2016-04-21 ENCOUNTER — Ambulatory Visit
Admission: RE | Admit: 2016-04-21 | Discharge: 2016-04-21 | Disposition: A | Discharge status: Home / Self Care | Payer: Managed Care, Other (non HMO) | Source: Ambulatory Visit | Attending: Family Medicine | Admitting: Family Medicine

## 2016-04-21 ENCOUNTER — Other Ambulatory Visit: Payer: Self-pay | Admitting: Family Medicine

## 2016-04-21 ENCOUNTER — Ambulatory Visit (INDEPENDENT_AMBULATORY_CARE_PROVIDER_SITE_OTHER): Payer: Managed Care, Other (non HMO) | Admitting: Family Medicine

## 2016-04-21 ENCOUNTER — Encounter: Payer: Self-pay | Admitting: Family Medicine

## 2016-04-21 VITALS — BP 128/84 | HR 69 | Temp 97.9°F | Ht 65.0 in | Wt 234.0 lb

## 2016-04-21 DIAGNOSIS — G4700 Insomnia, unspecified: Secondary | ICD-10-CM

## 2016-04-21 DIAGNOSIS — F909 Attention-deficit hyperactivity disorder, unspecified type: Secondary | ICD-10-CM | POA: Diagnosis not present

## 2016-04-21 DIAGNOSIS — Z1231 Encounter for screening mammogram for malignant neoplasm of breast: Secondary | ICD-10-CM | POA: Insufficient documentation

## 2016-04-21 NOTE — Progress Notes (Signed)
BP 128/84 mmHg  Pulse 69  Temp(Src) 97.9 F (36.6 C)  Ht 5\' 5"  (1.651 m)  Wt 234 lb (106.142 kg)  BMI 38.94 kg/m2  SpO2 98%  LMP 04/11/2016 (Approximate)   Subjective:    Patient ID: Tammy Mays, female    DOB: 1976/10/17, 40 y.o.   MRN: [id number]  HPI: Tammy Mays is a 40 y.o. female  Chief Complaint  Patient presents with  . ADHD  . Insomnia   Doing well on the adderall, only taking it when needed. Denies any side effects and states the medicine is helping a lot. Denies CP or palpitations, no appetite issues.  States she has always had sleep issues and takes ativan to help with that. No change since taking the adderall.   Patient also discussed plantar fasciitis has new shoes which helps. Is bothered with sprinting. 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 Discussed potential for cortisone shots if needed.  Relevant past medical, surgical, family and social history reviewed and updated as indicated. Interim medical history since our last visit reviewed. Allergies and medications reviewed and updated.  Review of Systems  Constitutional: Negative.   Respiratory: Negative.   Cardiovascular: Negative.   Neurological: Negative.   Psychiatric/Behavioral: Negative.     Per HPI unless specifically indicated above     Objective:    BP 128/84 mmHg  Pulse 69  Temp(Src) 97.9 F (36.6 C)  Ht 5\' 5"  (1.651 m)  Wt 234 lb (106.142 kg)  BMI 38.94 kg/m2  SpO2 98%  LMP 04/11/2016 (Approximate)  Wt Readings from Last 3 Encounters:  04/21/16 234 lb (106.142 kg)  01/26/16 238 lb (107.956 kg)  12/22/15 234 lb (106.142 kg)    Physical Exam  Constitutional: She is oriented to person, place, and time. She appears well-developed and well-nourished. No distress.  HENT:  Head: Normocephalic and atraumatic.  Right Ear: Hearing normal.  Left Ear: Hearing normal.  Nose: Nose normal.  Eyes: Conjunctivae and lids are normal. Right eye exhibits no  discharge. Left eye exhibits no discharge. No scleral icterus.  Cardiovascular: Normal rate, regular rhythm and normal heart sounds.   Pulmonary/Chest: Effort normal and breath sounds normal. No respiratory distress.  Musculoskeletal: Normal range of motion.  Neurological: She is alert and oriented to person, place, and time.  Skin: Skin is intact. No rash noted.  Psychiatric: She has a normal mood and affect. Her speech is normal and behavior is normal. Judgment and thought content normal. Cognition and memory are normal.    Results for orders placed or performed in visit on 01/26/16  Microscopic Examination  Result Value Ref Range   WBC, UA 0-5 0 -  5 /hpf   RBC, UA 0-2 0 -  2 /hpf   Epithelial Cells (non renal) 0-10 0 - 10 /hpf   Mucus, UA Present Not Estab.   Bacteria, UA Few None seen/Few  CBC with Differential/Platelet  Result Value Ref Range   WBC 8.6 3.4 - 10.8 x10E3/uL   RBC 4.48 3.77 - 5.28 x10E6/uL   Hemoglobin 13.7 11.1 - 15.9 g/dL   Hematocrit 14.739.7 82.934.0 - 46.6 %   MCV 89 79 - 97 fL   MCH 30.6 26.6 - 33.0 pg   MCHC 34.5 31.5 - 35.7 g/dL   RDW 56.213.9 13.012.3 - 86.515.4 %   Platelets 274 150 - 379 x10E3/uL   Neutrophils 55 %   Lymphs 30 %   Monocytes 9 %  Eos 6 %   Basos 0 %   Neutrophils Absolute 4.7 1.4 - 7.0 x10E3/uL   Lymphocytes Absolute 2.6 0.7 - 3.1 x10E3/uL   Monocytes Absolute 0.8 0.1 - 0.9 x10E3/uL   EOS (ABSOLUTE) 0.5 (H) 0.0 - 0.4 x10E3/uL   Basophils Absolute 0.0 0.0 - 0.2 x10E3/uL   Immature Granulocytes 0 %   Immature Grans (Abs) 0.0 0.0 - 0.1 x10E3/uL  Comprehensive metabolic panel  Result Value Ref Range   Glucose 72 65 - 99 mg/dL   BUN 12 6 - 24 mg/dL   Creatinine, Ser 4.090.82 0.57 - 1.00 mg/dL   GFR calc non Af Amer 89 >59 mL/min/1.73   GFR calc Af Amer 103 >59 mL/min/1.73   BUN/Creatinine Ratio 15 9 - 23   Sodium 143 134 - 144 mmol/L   Potassium 3.9 3.5 - 5.2 mmol/L   Chloride 103 96 - 106 mmol/L   CO2 24 18 - 29 mmol/L   Calcium 9.0 8.7 - 10.2  mg/dL   Total Protein 6.6 6.0 - 8.5 g/dL   Albumin 4.2 3.5 - 5.5 g/dL   Globulin, Total 2.4 1.5 - 4.5 g/dL   Albumin/Globulin Ratio 1.8 1.2 - 2.2   Bilirubin Total 0.4 0.0 - 1.2 mg/dL   Alkaline Phosphatase 70 39 - 117 IU/L   AST 15 0 - 40 IU/L   ALT 13 0 - 32 IU/L  Lipid Panel w/o Chol/HDL Ratio  Result Value Ref Range   Cholesterol, Total 139 100 - 199 mg/dL   Triglycerides 811103 0 - 149 mg/dL   HDL 55 >91>39 mg/dL   VLDL Cholesterol Cal 21 5 - 40 mg/dL   LDL Calculated 63 0 - 99 mg/dL  Urinalysis, Routine w reflex microscopic (not at [facility])  Result Value Ref Range   Specific Gravity, UA 1.020 1.005 - 1.030   pH, UA 6.0 5.0 - 7.5   Color, UA Yellow Yellow   Appearance Ur Clear Clear   Leukocytes, UA 1+ (A) Negative   Protein, UA Negative Negative/Trace   Glucose, UA Negative Negative   Ketones, UA Trace (A) Negative   RBC, UA Trace (A) Negative   Bilirubin, UA Negative Negative   Urobilinogen, Ur 0.2 0.2 - 1.0 mg/dL   Nitrite, UA Negative Negative   Microscopic Examination See below:   TSH  Result Value Ref Range   TSH 1.360 0.450 - 4.500 uIU/mL      Assessment & Plan:   Problem List Items Addressed This Visit      Other   Insomnia    The current medical regimen is effective;  continue present plan and medications. Only taking the ativan when needed.        Attention deficit hyperactivity disorder - Primary    Doing very well on current regimen, continue taking the adderall on an as needed basis.           Follow up plan: Return in about 3 months (around 07/22/2016), or if symptoms worsen or fail to improve, for adhd check.

## 2016-04-21 NOTE — Assessment & Plan Note (Signed)
Doing very well on current regimen, continue taking the adderall on an as needed basis.

## 2016-04-21 NOTE — Assessment & Plan Note (Addendum)
The current medical regimen is effective;  continue present plan and medications. Only taking the ativan when needed.

## 2016-05-03 ENCOUNTER — Other Ambulatory Visit: Payer: Self-pay | Admitting: Family Medicine

## 2016-05-17 LAB — HM PAP SMEAR: HM Pap smear: NEGATIVE

## 2016-05-31 ENCOUNTER — Other Ambulatory Visit: Payer: Self-pay | Admitting: Family Medicine

## 2016-05-31 MED ORDER — AMPHETAMINE-DEXTROAMPHET ER 5 MG PO CP24: 5.0000 mg | ORAL_CAPSULE | Freq: Every day | ORAL | 0 refills | Status: DC

## 2016-05-31 MED ORDER — AMPHETAMINE-DEXTROAMPHETAMINE 5 MG PO TABS: 5.0000 mg | ORAL_TABLET | Freq: Every day | ORAL | 0 refills | Status: DC

## 2016-06-06 ENCOUNTER — Other Ambulatory Visit: Payer: Self-pay | Admitting: Family Medicine

## 2016-06-07 NOTE — Telephone Encounter (Signed)
Routing to provider  

## 2016-06-22 ENCOUNTER — Other Ambulatory Visit: Payer: Self-pay | Admitting: Family Medicine

## 2016-06-22 MED ORDER — AMPHETAMINE-DEXTROAMPHETAMINE 5 MG PO TABS: 5.0000 mg | ORAL_TABLET | Freq: Every day | ORAL | 0 refills | Status: DC

## 2016-06-22 MED ORDER — AMPHETAMINE-DEXTROAMPHET ER 5 MG PO CP24: 5.0000 mg | ORAL_CAPSULE | Freq: Every day | ORAL | 0 refills | Status: DC

## 2016-07-06 ENCOUNTER — Other Ambulatory Visit: Payer: Self-pay | Admitting: Family Medicine

## 2016-07-07 ENCOUNTER — Other Ambulatory Visit: Payer: Self-pay | Admitting: Family Medicine

## 2016-07-07 MED ORDER — LORAZEPAM 1 MG PO TABS: 1.0000 mg | ORAL_TABLET | Freq: Every day | ORAL | 0 refills | Status: DC | PRN

## 2016-07-18 ENCOUNTER — Encounter: Payer: Self-pay | Admitting: Family Medicine

## 2016-07-18 ENCOUNTER — Ambulatory Visit (INDEPENDENT_AMBULATORY_CARE_PROVIDER_SITE_OTHER): Payer: Managed Care, Other (non HMO) | Admitting: Family Medicine

## 2016-07-18 ENCOUNTER — Other Ambulatory Visit: Payer: Self-pay | Admitting: Family Medicine

## 2016-07-18 ENCOUNTER — Ambulatory Visit: Payer: Managed Care, Other (non HMO) | Admitting: Family Medicine

## 2016-07-18 DIAGNOSIS — G4700 Insomnia, unspecified: Secondary | ICD-10-CM | POA: Diagnosis not present

## 2016-07-18 DIAGNOSIS — F909 Attention-deficit hyperactivity disorder, unspecified type: Secondary | ICD-10-CM | POA: Diagnosis not present

## 2016-07-18 DIAGNOSIS — M722 Plantar fascial fibromatosis: Secondary | ICD-10-CM | POA: Diagnosis not present

## 2016-07-18 MED ORDER — AMPHETAMINE-DEXTROAMPHETAMINE 5 MG PO TABS: 5.0000 mg | ORAL_TABLET | Freq: Every day | ORAL | 0 refills | Status: DC

## 2016-07-18 MED ORDER — AMPHETAMINE-DEXTROAMPHET ER 5 MG PO CP24: 5.0000 mg | ORAL_CAPSULE | Freq: Every day | ORAL | 0 refills | Status: DC

## 2016-07-18 MED ORDER — MIRTAZAPINE 15 MG PO TABS: 15.0000 mg | ORAL_TABLET | Freq: Every day | ORAL | 6 refills | Status: DC

## 2016-07-18 NOTE — Assessment & Plan Note (Signed)
Reviewed insomnia care and treatment will use Remeron. Discuss limitations of lorazepam and making prescription last a long time.

## 2016-07-18 NOTE — Assessment & Plan Note (Signed)
Discussed plantar fasciitis patient will go back to podiatry to assess for injections.
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
## 2016-07-18 NOTE — Assessment & Plan Note (Signed)
ADHD stable on medications will continue current meds and care

## 2016-07-20 ENCOUNTER — Other Ambulatory Visit: Payer: Self-pay | Admitting: Family Medicine

## 2016-07-20 MED ORDER — AMPHETAMINE-DEXTROAMPHET ER 5 MG PO CP24: 5.0000 mg | ORAL_CAPSULE | Freq: Every day | ORAL | 0 refills | Status: DC

## 2016-07-20 MED ORDER — AMPHETAMINE-DEXTROAMPHETAMINE 5 MG PO TABS: 5.0000 mg | ORAL_TABLET | Freq: Every day | ORAL | 0 refills | Status: DC

## 2016-07-20 NOTE — Progress Notes (Signed)
Extra refill given on ADHD medications due to patient going out of town for a month.

## 2016-08-22 ENCOUNTER — Other Ambulatory Visit: Payer: Self-pay | Admitting: Family Medicine

## 2016-08-31 ENCOUNTER — Other Ambulatory Visit: Payer: Self-pay | Admitting: Family Medicine

## 2016-09-26 ENCOUNTER — Other Ambulatory Visit: Payer: Self-pay | Admitting: Family Medicine

## 2016-09-26 MED ORDER — AMPHETAMINE-DEXTROAMPHETAMINE 5 MG PO TABS: 5.0000 mg | ORAL_TABLET | Freq: Every day | ORAL | 0 refills | Status: DC

## 2016-10-18 ENCOUNTER — Other Ambulatory Visit: Payer: Self-pay | Admitting: Family Medicine

## 2016-10-18 MED ORDER — AMPHETAMINE-DEXTROAMPHETAMINE 5 MG PO TABS: 5.0000 mg | ORAL_TABLET | Freq: Every day | ORAL | 0 refills | Status: DC

## 2016-11-10 ENCOUNTER — Encounter: Payer: Self-pay | Admitting: Family Medicine

## 2016-11-10 ENCOUNTER — Ambulatory Visit (INDEPENDENT_AMBULATORY_CARE_PROVIDER_SITE_OTHER): Payer: Managed Care, Other (non HMO) | Admitting: Family Medicine

## 2016-11-10 VITALS — BP 138/85 | HR 99 | Temp 99.0°F | Wt 237.0 lb

## 2016-11-10 DIAGNOSIS — J101 Influenza due to other identified influenza virus with other respiratory manifestations: Secondary | ICD-10-CM

## 2016-11-10 LAB — VERITOR FLU A/B WAIVED
Influenza A: POSITIVE — AB
Influenza B: NEGATIVE

## 2016-11-10 MED ORDER — ACETAMINOPHEN 500 MG PO TABS
1000.0000 mg | ORAL_TABLET | Freq: Once | ORAL | Status: AC
  Administered 2016-11-10: 1000 mg via ORAL

## 2016-11-10 MED ORDER — HYDROCOD POLST-CPM POLST ER 10-8 MG/5ML PO SUER: 5.0000 mL | Freq: Two times a day (BID) | ORAL | 0 refills | Status: DC | PRN

## 2016-11-10 MED ORDER — OSELTAMIVIR PHOSPHATE 75 MG PO CAPS: 75.0000 mg | ORAL_CAPSULE | Freq: Two times a day (BID) | ORAL | 0 refills | Status: DC

## 2016-11-10 NOTE — Progress Notes (Signed)
BP 138/85   Pulse 99   Temp 99 F (37.2 C)   Wt 237 lb (107.5 kg)   SpO2 99%   BMI 39.44 kg/m    Subjective:    Patient ID: Tammy Mays, female    DOB: December 13, 1975, 41 y.o.   MRN: [id number]  HPI: Tammy Mays is a 41 y.o. female  Chief Complaint  Patient presents with  . URI    x 2 days. cough, body aches, fever, chest cong, sore throat, ears hurt   Patient presents with 1 day history of sudden onset fever, chills, body aches, congestion, and cough. Has been taking 800 mg ibuprofen with minimal relief. Denies CP, SOB, some nausea that resolved with crackers. Unsure if any sick contacts. UTD on flu vaccine.   Past Medical History:  Diagnosis Date  . Attention deficit hyperactivity disorder   . Depression   . Insomnia    Social History   Social History  . Marital status: Married    Spouse name: Tammy Mays  . Number of children: Tammy Mays  . Years of education: Tammy Mays   Occupational History  . Not on file.   Social History Main Topics  . Smoking status: Former Smoker    Types: Cigarettes    Quit date: 06/29/1999  . Smokeless tobacco: Never Used  . Alcohol use 0.0 oz/week     Comment: occasional  . Drug use: No  . Sexual activity: Not on file   Other Topics Concern  . Not on file   Social History Narrative  . No narrative on file    Relevant past medical, surgical, family and social history reviewed and updated as indicated. Interim medical history since our last visit reviewed. Allergies and medications reviewed and updated.  Review of Systems  Constitutional: Positive for chills, diaphoresis, fatigue and fever.  HENT: Positive for congestion.   Eyes: Negative.   Respiratory: Positive for cough.   Cardiovascular: Negative.   Gastrointestinal: Positive for nausea.  Genitourinary: Negative.   Musculoskeletal: Positive for myalgias.  Neurological: Negative.   Psychiatric/Behavioral: Negative.     Per HPI unless specifically indicated above     Objective:    BP  138/85   Pulse 99   Temp 99 F (37.2 C)   Wt 237 lb (107.5 kg)   SpO2 99%   BMI 39.44 kg/m   Wt Readings from Last 3 Encounters:  11/10/16 237 lb (107.5 kg)  07/18/16 242 lb (109.8 kg)  04/21/16 234 lb (106.1 kg)    Physical Exam  Constitutional: She is oriented to person, place, and time. She appears well-developed and well-nourished.  HENT:  Head: Atraumatic.  Eyes: Conjunctivae are normal. Pupils are equal, round, and reactive to light.  Neck: Normal range of motion. Neck supple.  Cardiovascular: Normal rate and normal heart sounds.   Pulmonary/Chest: Effort normal and breath sounds normal. No respiratory distress.  Musculoskeletal: Normal range of motion.  Neurological: She is alert and oriented to person, place, and time.  Skin: Skin is warm and dry.  Psychiatric: She has a normal mood and affect. Her behavior is normal.  Nursing note and vitals reviewed.     Assessment & Plan:   Problem List Items Addressed This Visit    None    Visit Diagnoses    Influenza A    -  Primary   Tamiflu and tussionex sent. Continue tylenol and ibuprofen alternating. Discussed isolation as much as possible x 1 week. Follow up if no improvement.  Relevant Medications   oseltamivir (TAMIFLU) 75 MG capsule   acetaminophen (TYLENOL) tablet 1,000 mg (Completed)   Other Relevant Orders   Influenza A & B (STAT)       Follow up plan: Return if symptoms worsen or fail to improve.

## 2016-11-10 NOTE — Patient Instructions (Signed)
Follow up as needed

## 2016-11-17 ENCOUNTER — Ambulatory Visit: Payer: Managed Care, Other (non HMO) | Admitting: Family Medicine

## 2016-11-18 ENCOUNTER — Other Ambulatory Visit: Payer: Self-pay | Admitting: Family Medicine

## 2016-11-18 MED ORDER — AMPHETAMINE-DEXTROAMPHETAMINE 5 MG PO TABS: 5.0000 mg | ORAL_TABLET | Freq: Every day | ORAL | 0 refills | Status: DC

## 2016-12-01 ENCOUNTER — Telehealth: Payer: Self-pay | Admitting: Family Medicine

## 2016-12-01 ENCOUNTER — Other Ambulatory Visit: Payer: Self-pay | Admitting: Family Medicine

## 2016-12-01 MED ORDER — AMPHETAMINE-DEXTROAMPHET ER 5 MG PO CP24: 5.0000 mg | ORAL_CAPSULE | Freq: Every day | ORAL | 0 refills | Status: DC

## 2016-12-01 NOTE — Telephone Encounter (Signed)
Pt came in and stated that she needed a refill on adderall extended release

## 2016-12-01 NOTE — Telephone Encounter (Signed)
Medication had fallen off of current med list, did find on historic med list.   Last (acute) OV: 11/10/16 Last routine OV: 07/18/16 Next OV: none on file.

## 2016-12-26 ENCOUNTER — Other Ambulatory Visit: Payer: Self-pay | Admitting: Family Medicine

## 2016-12-26 MED ORDER — AMPHETAMINE-DEXTROAMPHET ER 5 MG PO CP24: 5.0000 mg | ORAL_CAPSULE | Freq: Every day | ORAL | 0 refills | Status: DC

## 2016-12-26 MED ORDER — AMPHETAMINE-DEXTROAMPHETAMINE 5 MG PO TABS: 5.0000 mg | ORAL_TABLET | Freq: Every day | ORAL | 0 refills | Status: DC

## 2017-01-17 ENCOUNTER — Other Ambulatory Visit: Payer: Self-pay | Admitting: Family Medicine

## 2017-01-17 MED ORDER — AMPHETAMINE-DEXTROAMPHETAMINE 5 MG PO TABS: 5.0000 mg | ORAL_TABLET | Freq: Every day | ORAL | 0 refills | Status: DC

## 2017-01-17 MED ORDER — AMPHETAMINE-DEXTROAMPHET ER 5 MG PO CP24: 5.0000 mg | ORAL_CAPSULE | Freq: Every day | ORAL | 0 refills | Status: DC

## 2017-01-17 NOTE — Telephone Encounter (Signed)
Last (acute) OV: 11/10/16 Last routine OV: 07/18/16 Next OV: 01/23/17   Adderall XR 5 mg last refilled 2/26 Adderall 5 mg last refilled 2/26

## 2017-01-23 ENCOUNTER — Encounter: Payer: Self-pay | Admitting: Family Medicine

## 2017-01-23 ENCOUNTER — Ambulatory Visit (INDEPENDENT_AMBULATORY_CARE_PROVIDER_SITE_OTHER): Payer: Managed Care, Other (non HMO) | Admitting: Family Medicine

## 2017-01-23 VITALS — BP 125/86 | HR 71 | Temp 98.7°F | Ht 65.0 in | Wt 237.0 lb

## 2017-01-23 DIAGNOSIS — Z0000 Encounter for general adult medical examination without abnormal findings: Secondary | ICD-10-CM | POA: Diagnosis not present

## 2017-01-23 DIAGNOSIS — F909 Attention-deficit hyperactivity disorder, unspecified type: Secondary | ICD-10-CM

## 2017-01-23 DIAGNOSIS — Z1329 Encounter for screening for other suspected endocrine disorder: Secondary | ICD-10-CM | POA: Diagnosis not present

## 2017-01-23 DIAGNOSIS — Z13220 Encounter for screening for lipoid disorders: Secondary | ICD-10-CM

## 2017-01-23 LAB — MICROSCOPIC EXAMINATION: Bacteria, UA: NONE SEEN

## 2017-01-23 LAB — UA/M W/RFLX CULTURE, ROUTINE
BILIRUBIN UA: NEGATIVE
GLUCOSE, UA: NEGATIVE
KETONES UA: NEGATIVE
Leukocytes, UA: NEGATIVE
NITRITE UA: NEGATIVE
PROTEIN UA: NEGATIVE
SPEC GRAV UA: 1.02 (ref 1.005–1.030)
UUROB: 0.2 mg/dL (ref 0.2–1.0)
pH, UA: 6 (ref 5.0–7.5)

## 2017-01-23 MED ORDER — AMPHETAMINE-DEXTROAMPHET ER 10 MG PO CP24: 10.0000 mg | ORAL_CAPSULE | Freq: Every day | ORAL | 0 refills | Status: DC

## 2017-01-23 NOTE — Progress Notes (Signed)
BP 125/86   Pulse 71   Temp 98.7 F (37.1 C)   Ht 5\' 5"  (1.651 m)   Wt 237 lb (107.5 kg)   LMP 01/16/2017 (Approximate)   SpO2 100%   BMI 39.44 kg/m    Subjective:    Patient ID: Tammy Mays, female    DOB: 08-28-1976, 41 y.o.   MRN: [id number]  HPI: Tammy Mays is a 41 y.o. female presenting on 01/23/2017 for comprehensive medical examination. Current medical complaints include:wanting to discuss her adderall 5 mg XR. States the past few weeks she doesn't feel like it's doing much of anything for her. Still taking the 5 mg regular release around lunchtime and feels that is sufficient. No CP, palpitations, SOB.   Sees GYN for breast and pelvic exams, UTD on health maintenance.   Depression Screen done today and results listed below:  [facility]  Decreased Interest 0  Down, Depressed, Hopeless 0  PHQ - 2 Score 0    The patient does not have a history of falls. I did not complete a risk assessment for falls. A plan of care for falls was not documented.   Past Medical History:  Past Medical History:  Diagnosis Date  . Attention deficit hyperactivity disorder   . Depression   . Insomnia     Surgical History:  Past Surgical History:  Procedure Laterality Date  . CERVICAL BIOPSY  W/ LOOP ELECTRODE EXCISION    . TONSILLECTOMY      Medications:  Current Outpatient Prescriptions on File Prior to Visit  Medication Sig  . amphetamine-dextroamphetamine (ADDERALL) 5 MG tablet Take 1 tablet (5 mg total) by mouth daily.  Marland Kitchen. ibuprofen (ADVIL,MOTRIN) 800 MG tablet TAKE 1 TABLET(800 MG) BY MOUTH EVERY 8 HOURS AS NEEDED  . LORazepam (ATIVAN) 1 MG tablet TAKE 1 TABLET BY MOUTH EVERY DAY AS NEEDED   No current facility-administered medications on file prior to visit.     Allergies:  Allergies  Allergen Reactions  . Effexor [Venlafaxine]     Social History:  Social History   Social History  . Marital status: Married    Spouse name: N/A  . Number  of children: N/A  . Years of education: N/A   Occupational History  . Not on file.   Social History Main Topics  . Smoking status: Former Smoker    Types: Cigarettes    Quit date: 06/29/1999  . Smokeless tobacco: Never Used  . Alcohol use 0.0 oz/week     Comment: occasional  . Drug use: No  . Sexual activity: Not on file   Other Topics Concern  . Not on file   Social History Narrative  . No narrative on file   History  Smoking Status  . Former Smoker  . Types: Cigarettes  . Quit date: 06/29/1999  Smokeless Tobacco  . Never Used   History  Alcohol Use  . 0.0 oz/week    Comment: occasional    Family History:  Family History  Problem Relation Age of Onset  . Hepatitis C Mother   . Breast cancer Neg Hx   . Cancer Neg Hx   . Diabetes Neg Hx   . Heart disease Neg Hx   . Hypertension Neg Hx   . Stroke Neg Hx   . COPD Neg Hx     Past medical history, surgical history, medications, allergies, family history and social history reviewed with patient today and changes made to appropriate areas of  the chart.   Review of Systems - General ROS: negative Psychological ROS: positive for - concentration difficulties ENT ROS: negative Breast ROS: negative for breast lumps Respiratory ROS: no cough, shortness of breath, or wheezing Cardiovascular ROS: no chest pain or dyspnea on exertion Gastrointestinal ROS: no abdominal pain, change in bowel habits, or black or bloody stools Genito-Urinary ROS: no dysuria, trouble voiding, or hematuria Musculoskeletal ROS: negative Neurological ROS: no TIA or stroke symptoms Dermatological ROS: negative All other ROS negative except what is listed above and in the HPI.      Objective:    BP 125/86   Pulse 71   Temp 98.7 F (37.1 C)   Ht 5\' 5"  (1.651 m)   Wt 237 lb (107.5 kg)   LMP 01/16/2017 (Approximate)   SpO2 100%   BMI 39.44 kg/m   Wt Readings from Last 3 Encounters:  01/23/17 237 lb (107.5 kg)  11/10/16 237 lb (107.5  kg)  07/18/16 242 lb (109.8 kg)    Physical Exam  Constitutional: She is oriented to person, place, and time. She appears well-developed and well-nourished. No distress.  HENT:  Head: Atraumatic.  Eyes: Conjunctivae are normal. Pupils are equal, round, and reactive to light. No scleral icterus.  Neck: Normal range of motion. Neck supple. No thyromegaly present.  Cardiovascular: Normal rate, regular rhythm, normal heart sounds and intact distal pulses.   Pulmonary/Chest: Effort normal and breath sounds normal. No respiratory distress.  Abdominal: Soft. Bowel sounds are normal. She exhibits no distension and no mass. There is no tenderness.  Musculoskeletal: Normal range of motion. She exhibits no edema or tenderness.  Lymphadenopathy:    She has no cervical adenopathy.  Neurological: She is alert and oriented to person, place, and time.  Skin: Skin is warm and dry.  Psychiatric: She has a normal mood and affect. Her behavior is normal.  Nursing note and vitals reviewed.   Results for orders placed or performed in visit on 01/23/17  Microscopic Examination  Result Value Ref Range   WBC, UA 0-5 0 - 5 /hpf   RBC, UA 0-2 0 - 2 /hpf   Epithelial Cells (non renal) 0-10 0 - 10 /hpf   Bacteria, UA None seen None seen/Few  UA/M w/rflx Culture, Routine  Result Value Ref Range   Specific Gravity, UA 1.020 1.005 - 1.030   pH, UA 6.0 5.0 - 7.5   Color, UA Yellow Yellow   Appearance Ur Clear Clear   Leukocytes, UA Negative Negative   Protein, UA Negative Negative/Trace   Glucose, UA Negative Negative   Ketones, UA Negative Negative   RBC, UA Trace (A) Negative   Bilirubin, UA Negative Negative   Urobilinogen, Ur 0.2 0.2 - 1.0 mg/dL   Nitrite, UA Negative Negative   Microscopic Examination See below:       Assessment & Plan:   Problem List Items Addressed This Visit      Other   Attention deficit hyperactivity disorder    Will increase morning dose from 5 mg XR to 10 mg XR.  Continue 5 mg regular release as before. F/u if no improvement       Other Visit Diagnoses    Annual physical exam    -  Primary   Relevant Orders   CBC with Differential/Platelet   Comprehensive metabolic panel   UA/M w/rflx Culture, Routine (Completed)   Screening for thyroid disorder       Await labs   Relevant Orders   TSH  Screening for hyperlipidemia       Await fasting lab results   Relevant Orders   Lipid Panel w/o Chol/HDL Ratio       Follow up plan: Return in about 3 months (around 04/25/2017) for ADHD f/u, med mnagement.   LABORATORY TESTING:  - Pap smear: done elsewhere  IMMUNIZATIONS:   - Tdap: Tetanus vaccination status reviewed: last tetanus booster within 10 years. - Influenza: Up to date  SCREENING: -Mammogram: Up to date   PATIENT COUNSELING:   Advised to take 1 mg of folate supplement per day if capable of pregnancy.   Sexuality: Discussed sexually transmitted diseases, partner selection, use of condoms, avoidance of unintended pregnancy  and contraceptive alternatives.   Advised to avoid cigarette smoking.  I discussed with the patient that most people either abstain from alcohol or drink within safe limits (<=14/week and <=4 drinks/occasion for males, <=7/weeks and <= 3 drinks/occasion for females) and that the risk for alcohol disorders and other health effects rises proportionally with the number of drinks per week and how often a drinker exceeds daily limits.  Discussed cessation/primary prevention of drug use and availability of treatment for abuse.   Diet: Encouraged to adjust caloric intake to maintain  or achieve ideal body weight, to reduce intake of dietary saturated fat and total fat, to limit sodium intake by avoiding high sodium foods and not adding table salt, and to maintain adequate dietary potassium and calcium preferably from fresh fruits, vegetables, and low-fat dairy products.    stressed the importance of regular  exercise  Injury prevention: Discussed safety belts, safety helmets, smoke detector, smoking near bedding or upholstery.   Dental health: Discussed importance of regular tooth brushing, flossing, and dental visits.    NEXT PREVENTATIVE PHYSICAL DUE IN 1 YEAR. Return in about 3 months (around 04/25/2017) for ADHD f/u, med mnagement.

## 2017-01-23 NOTE — Assessment & Plan Note (Signed)
Will increase morning dose from 5 mg XR to 10 mg XR. Continue 5 mg regular release as before. F/u if no improvement

## 2017-01-23 NOTE — Patient Instructions (Signed)
Follow up in 3 months for medication management and ADHD follow up

## 2017-01-24 ENCOUNTER — Other Ambulatory Visit: Payer: Self-pay | Admitting: Family Medicine

## 2017-01-24 DIAGNOSIS — Z1231 Encounter for screening mammogram for malignant neoplasm of breast: Secondary | ICD-10-CM

## 2017-01-24 LAB — COMPREHENSIVE METABOLIC PANEL
ALT: 15 IU/L (ref 0–32)
AST: 14 IU/L (ref 0–40)
Albumin/Globulin Ratio: 2 (ref 1.2–2.2)
Albumin: 4.5 g/dL (ref 3.5–5.5)
Alkaline Phosphatase: 66 IU/L (ref 39–117)
BUN/Creatinine Ratio: 12 (ref 9–23)
BUN: 10 mg/dL (ref 6–24)
Bilirubin Total: 0.7 mg/dL (ref 0.0–1.2)
CO2: 23 mmol/L (ref 18–29)
CREATININE: 0.83 mg/dL (ref 0.57–1.00)
Calcium: 9.1 mg/dL (ref 8.7–10.2)
Chloride: 102 mmol/L (ref 96–106)
GFR calc Af Amer: 101 mL/min/{1.73_m2} (ref >59)
GFR calc non Af Amer: 87 mL/min/{1.73_m2} (ref >59)
Globulin, Total: 2.2 g/dL (ref 1.5–4.5)
Glucose: 84 mg/dL (ref 65–99)
Potassium: 4.3 mmol/L (ref 3.5–5.2)
Sodium: 142 mmol/L (ref 134–144)
Total Protein: 6.7 g/dL (ref 6.0–8.5)

## 2017-01-24 LAB — LIPID PANEL W/O CHOL/HDL RATIO
Cholesterol, Total: 147 mg/dL (ref 100–199)
HDL: 55 mg/dL (ref >39)
LDL CALC: 72 mg/dL (ref 0–99)
Triglycerides: 100 mg/dL (ref 0–149)
VLDL CHOLESTEROL CAL: 20 mg/dL (ref 5–40)

## 2017-01-24 LAB — TSH: TSH: 1.91 u[IU]/mL (ref 0.450–4.500)

## 2017-01-24 LAB — CBC WITH DIFFERENTIAL/PLATELET
Basophils Absolute: 0 10*3/uL (ref 0.0–0.2)
Basos: 0 %
EOS (ABSOLUTE): 0.1 10*3/uL (ref 0.0–0.4)
EOS: 2 %
HEMATOCRIT: 43.2 % (ref 34.0–46.6)
Hemoglobin: 14.3 g/dL (ref 11.1–15.9)
IMMATURE GRANULOCYTES: 0 %
Immature Grans (Abs): 0 10*3/uL (ref 0.0–0.1)
LYMPHS: 34 %
Lymphocytes Absolute: 2.2 10*3/uL (ref 0.7–3.1)
MCH: 29.7 pg (ref 26.6–33.0)
MCHC: 33.1 g/dL (ref 31.5–35.7)
MCV: 90 fL (ref 79–97)
MONOS ABS: 0.6 10*3/uL (ref 0.1–0.9)
Monocytes: 9 %
NEUTROS PCT: 55 %
Neutrophils Absolute: 3.4 10*3/uL (ref 1.4–7.0)
PLATELETS: 249 10*3/uL (ref 150–379)
RBC: 4.81 x10E6/uL (ref 3.77–5.28)
RDW: 13.8 % (ref 12.3–15.4)
WBC: 6.3 10*3/uL (ref 3.4–10.8)

## 2017-02-01 ENCOUNTER — Encounter: Payer: Self-pay | Admitting: Family Medicine

## 2017-02-01 ENCOUNTER — Other Ambulatory Visit: Payer: Self-pay | Admitting: Family Medicine

## 2017-02-01 MED ORDER — LISDEXAMFETAMINE DIMESYLATE 50 MG PO CAPS
50.0000 mg | ORAL_CAPSULE | Freq: Every day | ORAL | 0 refills | Status: DC
Start: 2017-02-01 — End: 2017-03-03

## 2017-02-27 ENCOUNTER — Other Ambulatory Visit: Payer: Self-pay | Admitting: Family Medicine

## 2017-02-27 MED ORDER — AMPHETAMINE-DEXTROAMPHET ER 10 MG PO CP24: 10.0000 mg | ORAL_CAPSULE | Freq: Every day | ORAL | 0 refills | Status: DC

## 2017-02-27 MED ORDER — AMPHETAMINE-DEXTROAMPHETAMINE 5 MG PO TABS: 5.0000 mg | ORAL_TABLET | Freq: Every day | ORAL | 0 refills | Status: DC

## 2017-03-03 ENCOUNTER — Telehealth: Payer: Self-pay | Admitting: Family Medicine

## 2017-03-03 MED ORDER — LISDEXAMFETAMINE DIMESYLATE 50 MG PO CAPS: 50.0000 mg | ORAL_CAPSULE | Freq: Every day | ORAL | 0 refills | Status: DC

## 2017-03-03 NOTE — Telephone Encounter (Signed)
Pt would like refill for lisdexamfetamine (VYVANSE) 50 MG capsule.

## 2017-03-03 NOTE — Telephone Encounter (Signed)
Printed and ready for pick up. 

## 2017-03-07 ENCOUNTER — Other Ambulatory Visit: Payer: Self-pay | Admitting: Family Medicine

## 2017-04-14 ENCOUNTER — Telehealth: Payer: Self-pay | Admitting: Family Medicine

## 2017-04-14 MED ORDER — LISDEXAMFETAMINE DIMESYLATE 50 MG PO CAPS: 50.0000 mg | ORAL_CAPSULE | Freq: Every day | ORAL | 0 refills | Status: DC

## 2017-04-14 NOTE — Telephone Encounter (Signed)
Rx signed and given to pt.

## 2017-04-14 NOTE — Telephone Encounter (Signed)
Pt needs refill on lisdexamfetamine (VYVANSE) 50 MG capsule.

## 2017-04-20 ENCOUNTER — Encounter: Payer: Self-pay | Admitting: Family Medicine

## 2017-04-20 ENCOUNTER — Ambulatory Visit (INDEPENDENT_AMBULATORY_CARE_PROVIDER_SITE_OTHER): Payer: Managed Care, Other (non HMO) | Admitting: Family Medicine

## 2017-04-20 VITALS — BP 138/90 | HR 78 | Temp 98.2°F | Wt 239.6 lb

## 2017-04-20 DIAGNOSIS — F909 Attention-deficit hyperactivity disorder, unspecified type: Secondary | ICD-10-CM | POA: Diagnosis not present

## 2017-04-20 NOTE — Assessment & Plan Note (Signed)
Discussed adding a short acting dose in the early afternoon. Pt opting to leave things alone for now and only take 50 mg vyvanse as she seems to be doing very well with it.

## 2017-04-20 NOTE — Progress Notes (Signed)
   BP 138/90   Pulse 78   Temp 98.2 F (36.8 C)   Wt 239 lb 9.6 oz (108.7 kg)   LMP  (LMP Unknown)   SpO2 97%   BMI 39.87 kg/m    Subjective:    Patient ID: Tammy Mays, female    DOB: 1976-10-17, 41 y.o.   MRN: [id number]  HPI: Tammy Mays is a 41 y.o. female  Chief Complaint  Patient presents with  . ADHD    3 month f/up   Patient presents for ADHD f/u. Doing very well on vyvanse, feels like it's a very steady effect rather than the jolt and then crash you get with adderall. Does feel like it starts wearing off toward the late afternoon, but that it isn't bothersome yet to her. Denies side effects or concerns today.   Relevant past medical, surgical, family and social history reviewed and updated as indicated. Interim medical history since our last visit reviewed. Allergies and medications reviewed and updated.  Review of Systems  Constitutional: Negative.   HENT: Negative.   Respiratory: Negative.   Cardiovascular: Negative.   Gastrointestinal: Negative.   Genitourinary: Negative.   Musculoskeletal: Negative.   Neurological: Negative.   Psychiatric/Behavioral: Negative.    Per HPI unless specifically indicated above     Objective:    BP 138/90   Pulse 78   Temp 98.2 F (36.8 C)   Wt 239 lb 9.6 oz (108.7 kg)   LMP  (LMP Unknown)   SpO2 97%   BMI 39.87 kg/m   Wt Readings from Last 3 Encounters:  04/20/17 239 lb 9.6 oz (108.7 kg)  01/23/17 237 lb (107.5 kg)  11/10/16 237 lb (107.5 kg)    Physical Exam  Constitutional: She is oriented to person, place, and time. She appears well-developed and well-nourished. No distress.  HENT:  Head: Atraumatic.  Eyes: Conjunctivae are normal. Pupils are equal, round, and reactive to light.  Neck: Normal range of motion. Neck supple.  Cardiovascular: Normal rate, regular rhythm and normal heart sounds.   Pulmonary/Chest: Effort normal and breath sounds normal. No respiratory distress.  Musculoskeletal: Normal range of  motion.  Neurological: She is alert and oriented to person, place, and time.  Skin: Skin is warm and dry.  Psychiatric: She has a normal mood and affect. Her behavior is normal.  Nursing note and vitals reviewed.     Assessment & Plan:   Problem List Items Addressed This Visit      Other   Attention deficit hyperactivity disorder - Primary    Discussed adding a short acting dose in the early afternoon. Pt opting to leave things alone for now and only take 50 mg vyvanse as she seems to be doing very well with it.           Follow up plan: Return in about 6 months (around 10/20/2017) for ADHD f/u.

## 2017-05-12 ENCOUNTER — Ambulatory Visit
Admission: RE | Admit: 2017-05-12 | Discharge: 2017-05-12 | Disposition: A | Discharge status: Home / Self Care | Payer: PRIVATE HEALTH INSURANCE | Source: Ambulatory Visit | Attending: Family Medicine | Admitting: Family Medicine

## 2017-05-12 DIAGNOSIS — Z1231 Encounter for screening mammogram for malignant neoplasm of breast: Secondary | ICD-10-CM | POA: Diagnosis present

## 2017-05-29 ENCOUNTER — Other Ambulatory Visit: Payer: Self-pay | Admitting: Family Medicine

## 2017-05-29 MED ORDER — LISDEXAMFETAMINE DIMESYLATE 50 MG PO CAPS: 50.0000 mg | ORAL_CAPSULE | Freq: Every day | ORAL | 0 refills | Status: DC

## 2017-06-16 ENCOUNTER — Telehealth: Payer: Self-pay | Admitting: Family Medicine

## 2017-06-16 MED ORDER — SULFAMETHOXAZOLE-TRIMETHOPRIM 800-160 MG PO TABS: 1.0000 | ORAL_TABLET | Freq: Two times a day (BID) | ORAL | 0 refills | Status: DC

## 2017-06-16 NOTE — Telephone Encounter (Signed)
Bactrim sent to [facility] in [place] - will need to come in next week if no improvement

## 2017-06-16 NOTE — Telephone Encounter (Signed)
Called and let patient know about antibiotic being sent in and about coming in next week if no improvement. Patient verbalized understanding.

## 2017-07-07 ENCOUNTER — Other Ambulatory Visit: Payer: Self-pay | Admitting: Family Medicine

## 2017-07-07 MED ORDER — LISDEXAMFETAMINE DIMESYLATE 50 MG PO CAPS: 50.0000 mg | ORAL_CAPSULE | Freq: Every day | ORAL | 0 refills | Status: DC

## 2017-07-07 NOTE — Telephone Encounter (Signed)
Routing to provider. Prior Auth in progress thru Cover My Meds.

## 2017-07-07 NOTE — Telephone Encounter (Signed)
Patient requesting a refill on medication for Vyvanse. Vyvanse medication needing a prior-aurthorization from provider.  Patient's new insurance is [employer] which is on file in her demographics.  Please Advise.  Thank you

## 2017-07-26 ENCOUNTER — Encounter: Payer: Self-pay | Admitting: Family Medicine

## 2017-07-26 ENCOUNTER — Other Ambulatory Visit: Payer: Self-pay | Admitting: Family Medicine

## 2017-07-26 MED ORDER — LORAZEPAM 1 MG PO TABS: 1.0000 mg | ORAL_TABLET | Freq: Every day | ORAL | 0 refills | Status: DC | PRN

## 2017-07-26 NOTE — Telephone Encounter (Signed)
rx faxed to [facility].

## 2017-07-26 NOTE — Telephone Encounter (Signed)
Refill printed and ready to fax for pt

## 2017-09-17 ENCOUNTER — Other Ambulatory Visit: Payer: Self-pay | Admitting: Family Medicine

## 2017-09-26 ENCOUNTER — Ambulatory Visit (INDEPENDENT_AMBULATORY_CARE_PROVIDER_SITE_OTHER): Payer: PRIVATE HEALTH INSURANCE | Admitting: Obstetrics and Gynecology

## 2017-09-26 ENCOUNTER — Encounter: Payer: Self-pay | Admitting: Obstetrics and Gynecology

## 2017-09-26 VITALS — BP 124/78 | Ht 65.0 in | Wt 231.0 lb

## 2017-09-26 DIAGNOSIS — T8332XA Displacement of intrauterine contraceptive device, initial encounter: Secondary | ICD-10-CM | POA: Diagnosis not present

## 2017-09-26 DIAGNOSIS — G4700 Insomnia, unspecified: Secondary | ICD-10-CM

## 2017-09-26 DIAGNOSIS — Z124 Encounter for screening for malignant neoplasm of cervix: Secondary | ICD-10-CM | POA: Diagnosis not present

## 2017-09-26 DIAGNOSIS — Z1339 Encounter for screening examination for other mental health and behavioral disorders: Secondary | ICD-10-CM

## 2017-09-26 DIAGNOSIS — Z01419 Encounter for gynecological examination (general) (routine) without abnormal findings: Secondary | ICD-10-CM | POA: Diagnosis not present

## 2017-09-26 DIAGNOSIS — Z1331 Encounter for screening for depression: Secondary | ICD-10-CM | POA: Diagnosis not present

## 2017-09-26 MED ORDER — TRAZODONE HCL 50 MG PO TABS: 100.0000 mg | ORAL_TABLET | Freq: Every evening | ORAL | 1 refills | Status: DC | PRN

## 2017-09-26 NOTE — Progress Notes (Signed)
Gynecology Annual Exam   PCP: Steele Sizer, MD  Chief Complaint  Patient presents with  . Annual Exam    History of Present Illness:  Ms. Tammy Mays is a 41 y.o. No obstetric history on file. who LMP was Patient's last menstrual period was 09/19/2017., presents today for her annual examination.  Her menses are rare.  She does not have vasomotor sx.   She is single partner, contraception - IUD. She does not have vaginal dryness.  Last Pap:  04/2016 NILM, HPV negative (she has an extensive history of abnormal pap smears). She underwent a LEEP procedure in 12/2015 the path of which was normal.   Hx of STDs: HPV  Last mammogram: 05/12/2017  Results were: normal--routine follow-up in 12 months There is no FH of breast cancer. There is no FH of ovarian cancer. The patient does do self-breast exams.  Colonoscopy: n/a  DEXA: has not been screened for osteoporosis  Tobacco use: The patient denies current or previous tobacco use. Alcohol use: none Exercise: moderately active  The patient wears seatbelts: yes.     The patient also has had trouble sleeping. Has tried benzodiazepines, but does not want to take them long-term.   Past Medical History:  Diagnosis Date  . Abnormal Pap smear of cervix   . Anxiety   . Attention deficit hyperactivity disorder   . Depression   . Insomnia     Past Surgical History:  Procedure Laterality Date  . CERVICAL BIOPSY  W/ LOOP ELECTRODE EXCISION    . LEEP    . TONSILLECTOMY      Prior to Admission medications   Medication Sig Start Date End Date Taking? Authorizing Provider  ibuprofen (ADVIL,MOTRIN) 800 MG tablet TAKE 1 TABLET(800 MG) BY MOUTH EVERY 8 HOURS AS NEEDED 09/01/16  Yes Particia Nearing, PA-C  lisdexamfetamine (VYVANSE) 50 MG capsule Take 1 capsule (50 mg total) by mouth daily. 07/07/17  Yes Particia Nearing, PA-C  LORazepam (ATIVAN) 1 MG tablet TAKE 1 TABLET BY MOUTH DAILY AS NEEDED 09/18/17  Yes Particia Nearing, PA-C    Allergies  Allergen Reactions  . Effexor [Venlafaxine]    Obstetric History: G1P1001, s/p SVD 17 years ago  Family History  Problem Relation Age of Onset  . Hepatitis C Mother   . Breast cancer Neg Hx   . Cancer Neg Hx   . Diabetes Neg Hx   . Heart disease Neg Hx   . Hypertension Neg Hx   . Stroke Neg Hx   . COPD Neg Hx     Social History   Socioeconomic History  . Marital status: Married    Spouse name: Not on file  . Number of children: Not on file  . Years of education: Not on file  . Highest education level: Not on file  Social Needs  . Financial resource strain: Not on file  . Food insecurity - worry: Not on file  . Food insecurity - inability: Not on file  . Transportation needs - medical: Not on file  . Transportation needs - non-medical: Not on file  Occupational History  . Not on file  Tobacco Use  . Smoking status: Former Smoker    Types: Cigarettes    Last attempt to quit: 06/29/1999    Years since quitting: 18.2  . Smokeless tobacco: Never Used  Substance and Sexual Activity  . Alcohol use: Yes    Alcohol/week: 0.0 oz    Comment: occasional  .  Drug use: No  . Sexual activity: Not on file  Other Topics Concern  . Not on file  Social History Narrative  . Not on file    Review of Systems  Constitutional: Negative.   HENT: Negative.   Eyes: Negative.   Respiratory: Negative.   Cardiovascular: Negative.   Gastrointestinal: Negative.   Genitourinary: Negative.   Musculoskeletal: Negative.   Skin: Negative.   Neurological: Negative.   Psychiatric/Behavioral: Negative.      Physical Exam BP 124/78   Ht 5\' 5"  (1.651 m)   Wt 231 lb (104.8 kg)   LMP 09/19/2017   BMI 38.44 kg/m   Physical Exam  Constitutional: She is oriented to person, place, and time. She appears well-developed and well-nourished. No distress.  Genitourinary: Uterus normal. Pelvic exam was performed with patient supine. There is no rash, tenderness,  lesion or injury on the right labia. There is no rash, tenderness, lesion or injury on the left labia. No erythema, tenderness or bleeding in the vagina. No signs of injury around the vagina. No vaginal discharge found. Right adnexum does not display mass, does not display tenderness and does not display fullness. Left adnexum does not display mass, does not display tenderness and does not display fullness. Cervix does not exhibit motion tenderness, lesion, discharge, polyp or visible IUD strings.   Uterus is mobile and anteverted. Uterus is not enlarged, tender or exhibiting a mass.  HENT:  Head: Normocephalic and atraumatic.  Eyes: EOM are normal. No scleral icterus.  Neck: Normal range of motion. Neck supple. No thyromegaly present.  Cardiovascular: Normal rate and regular rhythm. Exam reveals no gallop and no friction rub.  No murmur heard. Pulmonary/Chest: Effort normal and breath sounds normal. No respiratory distress. She has no wheezes. She has no rales. Right breast exhibits no inverted nipple, no mass, no nipple discharge, no skin change and no tenderness. Left breast exhibits no inverted nipple, no mass, no nipple discharge, no skin change and no tenderness.  Abdominal: Soft. Bowel sounds are normal. She exhibits no distension and no mass. There is no tenderness. There is no rebound and no guarding.  Musculoskeletal: Normal range of motion. She exhibits no edema or tenderness.  Lymphadenopathy:    She has no cervical adenopathy.       Right: No inguinal adenopathy present.       Left: No inguinal adenopathy present.  Neurological: She is alert and oriented to person, place, and time. No cranial nerve deficit.  Skin: Skin is warm and dry. No rash noted. No erythema.  Psychiatric: She has a normal mood and affect. Her behavior is normal. Judgment normal.   Female chaperone present for pelvic and breast  portions of the physical exam  Results: AUDIT Questionnaire (screen for  alcoholism): 5 PHQ-9: 5  Assessment: 41 y.o. 861P1001 female here for routine gynecologic examination.  Plan: Problem List Items Addressed This Visit    Insomnia   Relevant Medications   traZODone (DESYREL) 50 MG tablet    Other Visit Diagnoses    Women's annual routine gynecological examination    -  Primary   Relevant Orders   IGP, Aptima HPV, rfx 16/18,45   Screening for depression       Screening for alcohol problem       Pap smear for cervical cancer screening       Relevant Orders   IGP, Aptima HPV, rfx 16/18,45   Intrauterine contraceptive device threads lost, initial encounter  Relevant Orders   US PELVIS TRANSVANGINAL NON-OB (TV ONLY)      Screening: -- Blood pressure screen normal -- Colonoscopy - not due -- Mammogram - not due until July -- Weight screening: obese: discussed management options, including lifestyle, dietary, and exercise. -- Depression screening negative (PHQ-9) -- Nutrition: normal -- cholesterol screening: per PCP -- osteoporosis screening: not due -- tobacco screening: not using -- alcohol screening: AUDIT questionnaire indicates low-risk usage. -- family history of breast cancer screening: done. not at high risk. -- no evidence of domestic violence or intimate partner violence. -- STD screening: gonorrhea/chlamydia NAAT not collected per patient request. -- pap smear collected per ASCCP guidelines -- flu vaccine received  -- HPV vaccination series: not eligilbe  For insomnia will try on a low dose of trazodone. Instructed on the use of this medication.    Thomasene MohairStephen Maelie Chriswell, MD 09/26/2017 9:59 AM

## 2017-09-28 LAB — IGP, APTIMA HPV, RFX 16/18,45
HPV Aptima: NEGATIVE
PAP Smear Comment: 0

## 2017-10-05 ENCOUNTER — Ambulatory Visit: Payer: PRIVATE HEALTH INSURANCE | Admitting: Obstetrics and Gynecology

## 2017-10-05 ENCOUNTER — Ambulatory Visit (INDEPENDENT_AMBULATORY_CARE_PROVIDER_SITE_OTHER): Payer: PRIVATE HEALTH INSURANCE

## 2017-10-05 ENCOUNTER — Encounter: Payer: Self-pay | Admitting: Obstetrics and Gynecology

## 2017-10-05 VITALS — BP 128/88 | Ht 65.0 in | Wt 230.0 lb

## 2017-10-05 DIAGNOSIS — T8332XA Displacement of intrauterine contraceptive device, initial encounter: Secondary | ICD-10-CM

## 2017-10-05 DIAGNOSIS — T8332XD Displacement of intrauterine contraceptive device, subsequent encounter: Secondary | ICD-10-CM

## 2017-10-05 NOTE — Progress Notes (Signed)
Gynecology Ultrasound Follow Up   Chief Complaint  Patient presents with  . Follow-up  IUD location  History of Present Illness: Patient is a 41 y.o. female who presents today for ultrasound evaluation of the above .  Ultrasound demonstrates the following findings Adnexa: no masses seen  Uterus: anteverted with endometrial stripe  Normal Additional: IUD in normal location with strings just under 2 cm from OS.  Past Medical History:  Diagnosis Date  . Abnormal Pap smear of cervix   . Anxiety   . Attention deficit hyperactivity disorder   . Depression   . Insomnia     Past Surgical History:  Procedure Laterality Date  . CERVICAL BIOPSY  W/ LOOP ELECTRODE EXCISION    . LEEP    . TONSILLECTOMY        Family History  Problem Relation Age of Onset  . Hepatitis C Mother   . Breast cancer Neg Hx   . Cancer Neg Hx   . Diabetes Neg Hx   . Heart disease Neg Hx   . Hypertension Neg Hx   . Stroke Neg Hx   . COPD Neg Hx     Social History   Socioeconomic History  . Marital status: Married    Spouse name: Not on file  . Number of children: Not on file  . Years of education: Not on file  . Highest education level: Not on file  Social Needs  . Financial resource strain: Not on file  . Food insecurity - worry: Not on file  . Food insecurity - inability: Not on file  . Transportation needs - medical: Not on file  . Transportation needs - non-medical: Not on file  Occupational History  . Not on file  Tobacco Use  . Smoking status: Former Smoker    Types: Cigarettes    Last attempt to quit: 06/29/1999    Years since quitting: 18.2  . Smokeless tobacco: Never Used  Substance and Sexual Activity  . Alcohol use: Yes    Alcohol/week: 0.0 oz    Comment: occasional  . Drug use: No  . Sexual activity: Not on file  Other Topics Concern  . Not on file  Social History Narrative  . Not on file    Allergies  Allergen Reactions  . Effexor [Venlafaxine]     Prior  to Admission medications   Medication Sig Start Date End Date Taking? Authorizing Provider  amphetamine-dextroamphetamine (ADDERALL XR) 10 MG 24 hr capsule Take 1 capsule (10 mg total) by mouth daily. Patient not taking: Reported on 04/20/2017 02/27/17   Steele Sizerrissman, Mark A, MD  amphetamine-dextroamphetamine (ADDERALL) 5 MG tablet Take 1 tablet (5 mg total) by mouth daily. Patient not taking: Reported on 04/20/2017 02/27/17   Steele Sizerrissman, Mark A, MD  ibuprofen (ADVIL,MOTRIN) 800 MG tablet TAKE 1 TABLET(800 MG) BY MOUTH EVERY 8 HOURS AS NEEDED 09/01/16   Particia NearingLane, Rachel Elizabeth, PA-C  lisdexamfetamine (VYVANSE) 50 MG capsule Take 1 capsule (50 mg total) by mouth daily. 07/07/17   Particia NearingLane, Rachel Elizabeth, PA-C  LORazepam (ATIVAN) 1 MG tablet TAKE 1 TABLET BY MOUTH DAILY AS NEEDED 09/18/17   Particia NearingLane, Rachel Elizabeth, PA-C  sulfamethoxazole-trimethoprim (BACTRIM DS,SEPTRA DS) 800-160 MG tablet Take 1 tablet by mouth 2 (two) times daily. Patient not taking: Reported on 09/26/2017 06/16/17   Particia NearingLane, Rachel Elizabeth, PA-C  traZODone (DESYREL) 50 MG tablet Take 2 tablets (100 mg total) by mouth at bedtime as needed for sleep (may take 0.5 - 2 tabs based  on effect). 09/26/17 10/26/17  Conard NovakJackson, Gibran Veselka D, MD    Physical Exam BP 128/88   Ht 5\' 5"  (1.651 m)   Wt 230 lb (104.3 kg)   LMP 09/19/2017   BMI 38.27 kg/m    General: NAD HEENT: normocephalic, anicteric Pulmonary: No increased work of breathing Extremities: no edema, erythema, or tenderness Neurologic: Grossly intact, normal gait Psychiatric: mood appropriate, affect full   Assessment: 41 y.o. female with IUD strings lost that were seen in the correct location on ultrasound today.  Plan: Problem List Items Addressed This Visit    None    Visit Diagnoses    Intrauterine contraceptive device threads lost, subsequent encounter    -  Primary    Patient reassured.  She is not due to have IUD removed at this time. Continue to monitor.   Thomasene MohairStephen Dung Prien,  MD 10/05/2017 1:12 PM

## 2017-10-17 ENCOUNTER — Other Ambulatory Visit: Payer: Self-pay | Admitting: Family Medicine

## 2017-10-17 MED ORDER — LISDEXAMFETAMINE DIMESYLATE 50 MG PO CAPS: 50.0000 mg | ORAL_CAPSULE | Freq: Every day | ORAL | 0 refills | Status: DC

## 2017-10-17 NOTE — Telephone Encounter (Signed)
Routing to provider  

## 2017-10-17 NOTE — Telephone Encounter (Signed)
I've not seen this pt.  Forwarding to [employer] Electricachel

## 2017-11-01 ENCOUNTER — Ambulatory Visit: Payer: PRIVATE HEALTH INSURANCE | Admitting: Family Medicine

## 2017-11-01 ENCOUNTER — Encounter: Payer: Self-pay | Admitting: Family Medicine

## 2017-11-01 VITALS — BP 135/78 | HR 74 | Wt 256.0 lb

## 2017-11-01 DIAGNOSIS — G4700 Insomnia, unspecified: Secondary | ICD-10-CM | POA: Diagnosis not present

## 2017-11-01 DIAGNOSIS — F909 Attention-deficit hyperactivity disorder, unspecified type: Secondary | ICD-10-CM

## 2017-11-01 DIAGNOSIS — L989 Disorder of the skin and subcutaneous tissue, unspecified: Secondary | ICD-10-CM | POA: Diagnosis not present

## 2017-11-01 NOTE — Progress Notes (Signed)
BP 135/78   Pulse 74   Wt 256 lb (116.1 kg)   SpO2 97%   BMI 42.60 kg/m    Subjective:    Patient ID: Tammy Mays, female    DOB: [birth year], 42 y.o.   MRN: [id number]  HPI: Tammy Mays is a 42 y.o. female  Chief Complaint  Patient presents with  . Follow-up    ADHD  . Abrasion    Under gun    Patient presents today for ADHD f/u.  Vyvanse going very well for her, sleeping better, no appetite issues, very steady focus throughout the day. Notes it is going much better for her than the adderall ever did. Denies CP, palpitations.   Was tried on trazodone for her occasional insomnia to see about getting her off the prn ativan but noted extreme sedation with the medication any time she used it. States she felt hung over the next day with it. Has done well with just occasional use of half a tab ativan for years and has never had any altered alertness with it.   Also notes an area of concern on her right hip right under where her gun sits on her police belt. Area has popped up over the last few months and is itchy and irritated. No major changes noticed in appearance, slightly scaly. Has not tried anything other than moisturizers.   Past Medical History:  Diagnosis Date  . Abnormal Pap smear of cervix   . Anxiety   . Attention deficit hyperactivity disorder   . Depression   . Insomnia    Social History   Socioeconomic History  . Marital status: Married    Spouse name: Not on file  . Number of children: Not on file  . Years of education: Not on file  . Highest education level: Not on file  Social Needs  . Financial resource strain: Not on file  . Food insecurity - worry: Not on file  . Food insecurity - inability: Not on file  . Transportation needs - medical: Not on file  . Transportation needs - non-medical: Not on file  Occupational History  . Not on file  Tobacco Use  . Smoking status: Former Smoker    Types: Cigarettes    Last attempt to quit: 06/29/1999    Years  since quitting: 18.3  . Smokeless tobacco: Never Used  Substance and Sexual Activity  . Alcohol use: Yes    Alcohol/week: 0.0 oz    Comment: occasional  . Drug use: No  . Sexual activity: Not on file  Other Topics Concern  . Not on file  Social History Narrative  . Not on file   Relevant past medical, surgical, family and social history reviewed and updated as indicated. Interim medical history since our last visit reviewed. Allergies and medications reviewed and updated.  Review of Systems  Constitutional: Negative.   HENT: Negative.   Eyes: Negative.   Respiratory: Negative.   Cardiovascular: Negative.   Gastrointestinal: Negative.   Genitourinary: Negative.   Musculoskeletal: Negative.   Skin:       lesion  Neurological: Negative.   Psychiatric/Behavioral: Positive for sleep disturbance.   Per HPI unless specifically indicated above     Objective:    BP 135/78   Pulse 74   Wt 256 lb (116.1 kg)   SpO2 97%   BMI 42.60 kg/m   Wt Readings from Last 3 Encounters:  11/01/17 256 lb (116.1 kg)  10/05/17 230 lb (104.3  kg)  09/26/17 231 lb (104.8 kg)    Physical Exam  Constitutional: She is oriented to person, place, and time. She appears well-developed and well-nourished. No distress.  HENT:  Head: Atraumatic.  Eyes: Conjunctivae are normal. Pupils are equal, round, and reactive to light. No scleral icterus.  Neck: Normal range of motion. Neck supple.  Cardiovascular: Normal rate and normal heart sounds.  Pulmonary/Chest: Effort normal and breath sounds normal. No respiratory distress.  Musculoskeletal: Normal range of motion.  Neurological: She is alert and oriented to person, place, and time.  Skin: Skin is warm and dry.  4-5 mm fairly round rough lesion on lateral right hip. Non-tender, no discoloration  Psychiatric: She has a normal mood and affect. Her behavior is normal.  Nursing note and vitals reviewed.   Results for orders placed or performed in visit  on 09/26/17  HM PAP SMEAR  Result Value Ref Range   HM Pap smear Negative, HPV negative   IGP, Aptima HPV, rfx 16/18,45  Result Value Ref Range   DIAGNOSIS: Comment    Specimen adequacy: Comment    Clinician Provided ICD10 Comment    Performed by: Comment    PAP Smear Comment .    Note: Comment    Test Methodology Comment    HPV Aptima Negative Negative      Assessment & Plan:   Problem List Items Addressed This Visit      Other   Insomnia - Primary    Poor response on trazodone, not open to trial of another medication at this time. Will resume rare use of ativan.       Attention deficit hyperactivity disorder    Excellent response to vyvanse, continue current regimen       Other Visit Diagnoses    Skin lesion       Appears warty vs AK - agreeable to a shave bx at upcoming f/u. Will send out for pathology at that time. Alert office with any changes to lesion in meantime       Follow up plan: Return in about 3 months (around 01/30/2018) for ADHD, skin biopsy - 30 min.

## 2017-11-02 MED ORDER — OSELTAMIVIR PHOSPHATE 75 MG PO CAPS: 75.0000 mg | ORAL_CAPSULE | Freq: Every day | ORAL | 0 refills | Status: DC

## 2017-11-03 NOTE — Assessment & Plan Note (Signed)
Excellent response to vyvanse, continue current regimen

## 2017-11-03 NOTE — Patient Instructions (Signed)
Follow up in 3 months

## 2017-11-03 NOTE — Assessment & Plan Note (Signed)
Poor response on trazodone, not open to trial of another medication at this time. Will resume rare use of ativan.

## 2017-11-05 ENCOUNTER — Other Ambulatory Visit: Payer: Self-pay | Admitting: Family Medicine

## 2017-11-06 MED ORDER — LORAZEPAM 1 MG PO TABS: 1.0000 mg | ORAL_TABLET | Freq: Every day | ORAL | 0 refills | Status: DC | PRN

## 2017-11-06 NOTE — Telephone Encounter (Signed)
Routing to provider  

## 2017-11-19 ENCOUNTER — Other Ambulatory Visit: Payer: Self-pay | Admitting: Family Medicine

## 2017-11-19 ENCOUNTER — Encounter: Payer: Self-pay | Admitting: Family Medicine

## 2017-11-20 MED ORDER — LISDEXAMFETAMINE DIMESYLATE 50 MG PO CAPS: 50.0000 mg | ORAL_CAPSULE | Freq: Every day | ORAL | 0 refills | Status: DC

## 2017-11-20 NOTE — Telephone Encounter (Signed)
Routing to provider. Patient has sent a patient advice request also.

## 2017-11-20 NOTE — Telephone Encounter (Signed)
Routing to provider  

## 2017-12-26 ENCOUNTER — Other Ambulatory Visit: Payer: Self-pay | Admitting: Family Medicine

## 2017-12-26 MED ORDER — LISDEXAMFETAMINE DIMESYLATE 50 MG PO CAPS: 50.0000 mg | ORAL_CAPSULE | Freq: Every day | ORAL | 0 refills | Status: DC

## 2017-12-26 MED ORDER — LORAZEPAM 1 MG PO TABS: 1.0000 mg | ORAL_TABLET | Freq: Every day | ORAL | 0 refills | Status: DC | PRN

## 2017-12-26 NOTE — Telephone Encounter (Signed)
Routing to provider  

## 2018-01-30 ENCOUNTER — Encounter: Payer: Self-pay | Admitting: Family Medicine

## 2018-01-30 ENCOUNTER — Ambulatory Visit (INDEPENDENT_AMBULATORY_CARE_PROVIDER_SITE_OTHER): Payer: PRIVATE HEALTH INSURANCE | Admitting: Family Medicine

## 2018-01-30 VITALS — BP 133/90 | HR 78 | Temp 97.6°F | Wt 248.2 lb

## 2018-01-30 DIAGNOSIS — G4700 Insomnia, unspecified: Secondary | ICD-10-CM | POA: Diagnosis not present

## 2018-01-30 DIAGNOSIS — L989 Disorder of the skin and subcutaneous tissue, unspecified: Secondary | ICD-10-CM | POA: Diagnosis not present

## 2018-01-30 DIAGNOSIS — F909 Attention-deficit hyperactivity disorder, unspecified type: Secondary | ICD-10-CM

## 2018-01-30 DIAGNOSIS — R21 Rash and other nonspecific skin eruption: Secondary | ICD-10-CM

## 2018-01-30 MED ORDER — LISDEXAMFETAMINE DIMESYLATE 50 MG PO CAPS: 50.0000 mg | ORAL_CAPSULE | Freq: Every day | ORAL | 0 refills | Status: DC

## 2018-01-30 MED ORDER — LORAZEPAM 1 MG PO TABS: 1.0000 mg | ORAL_TABLET | Freq: Every day | ORAL | 0 refills | Status: DC | PRN

## 2018-01-30 NOTE — Progress Notes (Signed)
BP 133/90 (BP Location: Right Arm, Patient Position: Sitting, Cuff Size: Normal)   Pulse 78   Temp 97.6 F (36.4 C) (Oral)   Wt 248 lb 3.2 oz (112.6 kg)   SpO2 98%   BMI 41.30 kg/m    Subjective:    Patient ID: Tammy Mays, female    DOB: [birth year], 42 y.o.   MRN: [id number]  HPI: Tammy Mays is a 42 y.o. female  Chief Complaint  Patient presents with  . ADHD  . Medication Refill  . Skin Lesion    Lesion is better since patient has been putting creams on it. Right where gun rubs.  . Tick Removal    Pulled off Friday. Severely itching.   Pt here today for ADHD f/u. Continues to do very well on vyvanse, no concerns. Denies CP, SOB, sleep or appetite issues. Feels significant benefit from the medicine.   Tick bite x 2 days on right thigh, itching really badly and is swollen in the area. Denies fever, chills, body aches, HAs, rash apart from area redness and swelling at site. Has been using OTC benadryl cream with mild relief.   Takes 1/2 tab lorazepam every once in a while in the evenings for anxiety and sleep. Continues to do well on this. Has tried several other sleep medications but they all make her feel groggy and hung over the next morning. Denies side effect or concerns.   Scaly skin lesion on right hip improved from last visit, pt wanting to hold off on shave bx. Has been using eucerin daily on area with good relief.   Past Medical History:  Diagnosis Date  . Abnormal Pap smear of cervix   . Anxiety   . Attention deficit hyperactivity disorder   . Depression   . Insomnia    Social History   Socioeconomic History  . Marital status: Married    Spouse name: Not on file  . Number of children: Not on file  . Years of education: Not on file  . Highest education level: Not on file  Occupational History  . Not on file  Social Needs  . Financial resource strain: Not on file  . Food insecurity:    Worry: Not on file    Inability: Not on file  . Transportation  needs:    Medical: Not on file    Non-medical: Not on file  Tobacco Use  . Smoking status: Former Smoker    Types: Cigarettes    Last attempt to quit: 06/29/1999    Years since quitting: 18.6  . Smokeless tobacco: Never Used  Substance and Sexual Activity  . Alcohol use: Yes    Alcohol/week: 0.0 oz    Comment: occasional  . Drug use: No  . Sexual activity: Not on file  Lifestyle  . Physical activity:    Days per week: Not on file    Minutes per session: Not on file  . Stress: Not on file  Relationships  . Social connections:    Talks on phone: Not on file    Gets together: Not on file    Attends religious service: Not on file    Active member of club or organization: Not on file    Attends meetings of clubs or organizations: Not on file    Relationship status: Not on file  . Intimate partner violence:    Fear of current or ex partner: Not on file    Emotionally abused: Not on file  Physically abused: Not on file    Forced sexual activity: Not on file  Other Topics Concern  . Not on file  Social History Narrative  . Not on file    Relevant past medical, surgical, family and social history reviewed and updated as indicated. Interim medical history since our last visit reviewed. Allergies and medications reviewed and updated.  Review of Systems  Per HPI unless specifically indicated above     Objective:    BP 133/90 (BP Location: Right Arm, Patient Position: Sitting, Cuff Size: Normal)   Pulse 78   Temp 97.6 F (36.4 C) (Oral)   Wt 248 lb 3.2 oz (112.6 kg)   SpO2 98%   BMI 41.30 kg/m   Wt Readings from Last 3 Encounters:  01/30/18 248 lb 3.2 oz (112.6 kg)  11/01/17 256 lb (116.1 kg)  10/05/17 230 lb (104.3 kg)    Physical Exam  Constitutional: She is oriented to person, place, and time. She appears well-developed and well-nourished. No distress.  HENT:  Head: Atraumatic.  Eyes: Pupils are equal, round, and reactive to light. Conjunctivae are normal.   Neck: Normal range of motion. Neck supple.  Cardiovascular: Normal rate and normal heart sounds.  Pulmonary/Chest: Effort normal and breath sounds normal. No respiratory distress.  Musculoskeletal: Normal range of motion.  Neurological: She is alert and oriented to person, place, and time.  Skin: Skin is warm and dry. Rash (erythematous, edematous maculopapular rash at site of tick bite. No bull's eye pattern, isolated to site) noted.  Skin lesion right hip much improved from previous, smaller and smooth, non-tender  Psychiatric: She has a normal mood and affect. Her behavior is normal.  Nursing note and vitals reviewed.   Results for orders placed or performed in visit on 09/26/17  HM PAP SMEAR  Result Value Ref Range   HM Pap smear Negative, HPV negative   IGP, Aptima HPV, rfx 16/18,45  Result Value Ref Range   DIAGNOSIS: Comment    Specimen adequacy: Comment    Clinician Provided ICD10 Comment    Performed by: Comment    PAP Smear Comment .    Note: Comment    Test Methodology Comment    HPV Aptima Negative Negative      Assessment & Plan:   Problem List Items Addressed This Visit      Other   Insomnia    Stable, continue prn use of 1/2 tab of ativan as well as good sleep hygiene tactics      Attention deficit hyperactivity disorder - Primary    Stable and under good control, continue current regimen       Other Visit Diagnoses    Skin lesion       Improved with daily moisturizing, will just continue to monitor for now. Pt wishing to hold off on bx at this time.    Rash       Appears allergic from recent tick bite, no assoc sxs other than itching. Benadryl cream, ice. Warning sxs of tick illness given, will draw labs if sxs start up       Follow up plan: Return for CPE ASAP, ADHD in 3 months.

## 2018-01-30 NOTE — Assessment & Plan Note (Signed)
Stable, continue prn use of 1/2 tab of ativan as well as good sleep hygiene tactics

## 2018-02-01 NOTE — Assessment & Plan Note (Signed)
Stable and under good control, continue current regimen 

## 2018-02-01 NOTE — Patient Instructions (Signed)
Follow up for CPE 

## 2018-02-19 ENCOUNTER — Encounter: Payer: Self-pay | Admitting: Family Medicine

## 2018-02-28 ENCOUNTER — Ambulatory Visit (INDEPENDENT_AMBULATORY_CARE_PROVIDER_SITE_OTHER): Payer: PRIVATE HEALTH INSURANCE | Admitting: Family Medicine

## 2018-02-28 ENCOUNTER — Encounter: Payer: Self-pay | Admitting: Family Medicine

## 2018-02-28 VITALS — BP 132/86 | HR 74 | Ht 65.0 in | Wt 225.0 lb

## 2018-02-28 DIAGNOSIS — F909 Attention-deficit hyperactivity disorder, unspecified type: Secondary | ICD-10-CM | POA: Diagnosis not present

## 2018-02-28 DIAGNOSIS — G4700 Insomnia, unspecified: Secondary | ICD-10-CM

## 2018-02-28 DIAGNOSIS — F419 Anxiety disorder, unspecified: Secondary | ICD-10-CM

## 2018-02-28 DIAGNOSIS — Z1239 Encounter for other screening for malignant neoplasm of breast: Secondary | ICD-10-CM

## 2018-02-28 DIAGNOSIS — Z0000 Encounter for general adult medical examination without abnormal findings: Secondary | ICD-10-CM

## 2018-02-28 DIAGNOSIS — Z0001 Encounter for general adult medical examination with abnormal findings: Secondary | ICD-10-CM | POA: Diagnosis not present

## 2018-02-28 DIAGNOSIS — R21 Rash and other nonspecific skin eruption: Secondary | ICD-10-CM | POA: Diagnosis not present

## 2018-02-28 DIAGNOSIS — J309 Allergic rhinitis, unspecified: Secondary | ICD-10-CM | POA: Diagnosis not present

## 2018-02-28 LAB — MICROSCOPIC EXAMINATION: Bacteria, UA: NONE SEEN

## 2018-02-28 LAB — URINALYSIS, ROUTINE W REFLEX MICROSCOPIC
Bilirubin, UA: NEGATIVE
Glucose, UA: NEGATIVE
Ketones, UA: NEGATIVE
LEUKOCYTES UA: NEGATIVE
Nitrite, UA: NEGATIVE
PH UA: 6 (ref 5.0–7.5)
PROTEIN UA: NEGATIVE
Specific Gravity, UA: 1.025 (ref 1.005–1.030)
Urobilinogen, Ur: 0.2 mg/dL (ref 0.2–1.0)

## 2018-02-28 MED ORDER — TRIAMCINOLONE ACETONIDE 0.1 % EX CREA: 1.0000 "application " | TOPICAL_CREAM | Freq: Two times a day (BID) | CUTANEOUS | 0 refills | Status: DC

## 2018-02-28 NOTE — Assessment & Plan Note (Signed)
Took a short break from her vyvanse per instructions by teledoctor who treated ear infection as she was concurrently on sudafed. Discussed can restart now that she's stopped the sudafed. Continue current regimen

## 2018-02-28 NOTE — Progress Notes (Signed)
BP 132/86   Pulse 74   Ht  (1.651 m)   Wt 225 lb (102.1 kg)   SpO2 98%   BMI 37.44 kg/m    Subjective:    Patient ID: Tammy Mays, female    DOB: [birth year], 42 y.o.   MRN: [id number]  HPI: Tammy Mays is a 42 y.o. female presenting on 02/28/2018 for comprehensive medical examination. Current medical complaints include:see below  Got an ear infection several weeks ago and has completed her antibiotics. Still using flonase twice daily and taking allegra. Feeling much better but wants them checked to make sure it's cleared.   Continues to do well with prn lorazepam for anxiety and insomnia. Does not take on regular basis, just as needed.   Vyvanse doing well for her focus. No side effects reported.   Goes to [facility] for pap smears and breast exams.   She currently lives with: Menopausal Symptoms: no  Depression Screen done today and results listed below:  Depression screen [facility] 2/9 02/28/2018 02/28/2018 01/23/2017  Decreased Interest 0 0 0  Down, Depressed, Hopeless 0 0 0  PHQ - 2 Score 0 0 0  Altered sleeping 2 2 -  Tired, decreased energy 0 0 -  Change in appetite 0 0 -  Feeling bad or failure about yourself  0 0 -  Trouble concentrating 0 0 -  Moving slowly or fidgety/restless 0 0 -  Suicidal thoughts 0 0 -  PHQ-9 Score 2 2 -    The patient does not have a history of falls. I did not complete a risk assessment for falls. A plan of care for falls was not documented.   Past Medical History:  Past Medical History:  Diagnosis Date  . Abnormal Pap smear of cervix   . Anxiety   . Attention deficit hyperactivity disorder   . Depression   . Insomnia     Surgical History:  Past Surgical History:  Procedure Laterality Date  . CERVICAL BIOPSY  W/ LOOP ELECTRODE EXCISION    . LEEP    . TONSILLECTOMY      Medications:  Current Outpatient Medications on File Prior to Visit  Medication Sig  . ibuprofen (ADVIL,MOTRIN) 800 MG tablet TAKE 1 TABLET(800 MG) BY  MOUTH EVERY 8 HOURS AS NEEDED  . lisdexamfetamine (VYVANSE) 50 MG capsule Take 1 capsule (50 mg total) by mouth daily.  Marland Kitchen LORazepam (ATIVAN) 1 MG tablet Take 1 tablet (1 mg total) by mouth daily as needed.   No current facility-administered medications on file prior to visit.     Allergies:  Allergies  Allergen Reactions  . Effexor [Venlafaxine]     Social History:  Social History   Socioeconomic History  . Marital status: Married    Spouse name: Not on file  . Number of children: Not on file  . Years of education: Not on file  . Highest education level: Not on file  Occupational History  . Not on file  Social Needs  . Financial resource strain: Not on file  . Food insecurity:    Worry: Not on file    Inability: Not on file  . Transportation needs:    Medical: Not on file    Non-medical: Not on file  Tobacco Use  . Smoking status: Former Smoker    Types: Cigarettes    Last attempt to quit: 06/29/1999    Years since quitting: 18.6  . Smokeless tobacco: Never Used  Substance and Sexual  Activity  . Alcohol use: Yes    Alcohol/week: 0.0 oz    Comment: occasional  . Drug use: No  . Sexual activity: Not on file  Lifestyle  . Physical activity:    Days per week: Not on file    Minutes per session: Not on file  . Stress: Not on file  Relationships  . Social connections:    Talks on phone: Not on file    Gets together: Not on file    Attends religious service: Not on file    Active member of club or organization: Not on file    Attends meetings of clubs or organizations: Not on file    Relationship status: Not on file  . Intimate partner violence:    Fear of current or ex partner: Not on file    Emotionally abused: Not on file    Physically abused: Not on file    Forced sexual activity: Not on file  Other Topics Concern  . Not on file  Social History Narrative  . Not on file   Social History   Tobacco Use  Smoking Status Former Smoker  . Types: Cigarettes   . Last attempt to quit: 06/29/1999  . Years since quitting: 18.6  Smokeless Tobacco Never Used   Social History   Substance and Sexual Activity  Alcohol Use Yes  . Alcohol/week: 0.0 oz   Comment: occasional    Family History:  Family History  Problem Relation Age of Onset  . Hepatitis C Mother   . Breast cancer Neg Hx   . Cancer Neg Hx   . Diabetes Neg Hx   . Heart disease Neg Hx   . Hypertension Neg Hx   . Stroke Neg Hx   . COPD Neg Hx     Past medical history, surgical history, medications, allergies, family history and social history reviewed with patient today and changes made to appropriate areas of the chart.   Review of Systems - General ROS: negative Psychological ROS: positive for - anxiety and sleep disturbances Ophthalmic ROS: negative ENT ROS: positive for - nasal discharge and ear pain Allergy and Immunology ROS: positive for - seasonal allergies Endocrine ROS: negative Breast ROS: negative for breast lumps Respiratory ROS: no cough, shortness of breath, or wheezing Cardiovascular ROS: no chest pain or dyspnea on exertion Gastrointestinal ROS: no abdominal pain, change in bowel habits, or black or bloody stools Genito-Urinary ROS: no dysuria, trouble voiding, or hematuria Musculoskeletal ROS: negative Neurological ROS: no TIA or stroke symptoms Dermatological ROS: positive for pruritus and rash All other ROS negative except what is listed above and in the HPI.      Objective:    BP 132/86   Pulse 74   Ht 5\' 5"  (1.651 m)   Wt 225 lb (102.1 kg)   SpO2 98%   BMI 37.44 kg/m   Wt Readings from Last 3 Encounters:  02/28/18 225 lb (102.1 kg)  01/30/18 248 lb 3.2 oz (112.6 kg)  11/01/17 256 lb (116.1 kg)    Physical Exam  Constitutional: She is oriented to person, place, and time. She appears well-developed and well-nourished. No distress.  HENT:  Head: Atraumatic.  Right Ear: External ear normal.  Left Ear: External ear normal.  Nose: Nose  normal.  Mouth/Throat: Oropharynx is clear and moist. No oropharyngeal exudate.  Eyes: Pupils are equal, round, and reactive to light. Conjunctivae are normal. No scleral icterus.  Neck: Normal range of motion. Neck supple. No thyromegaly present.  Cardiovascular: Normal rate, regular rhythm, normal heart sounds and intact distal pulses.  Pulmonary/Chest: Effort normal and breath sounds normal. No respiratory distress.  Abdominal: Soft. Bowel sounds are normal. She exhibits no mass. There is no tenderness.  Musculoskeletal: Normal range of motion. She exhibits no edema or tenderness.  Lymphadenopathy:    She has no cervical adenopathy.  Neurological: She is alert and oriented to person, place, and time. No cranial nerve deficit.  Skin: Skin is warm and dry. Rash (erythematous maculopapular rash at tick bite site right thigh) noted.  Psychiatric: She has a normal mood and affect. Her behavior is normal.  Nursing note and vitals reviewed.   Results for orders placed or performed in visit on 02/28/18  Microscopic Examination  Result Value Ref Range   WBC, UA 0-5 0 - 5 /hpf   RBC, UA 0-2 0 - 2 /hpf   Epithelial Cells (non renal) 0-10 0 - 10 /hpf   Mucus, UA Present Not Estab.   Bacteria, UA None seen None seen/Few  CBC with Differential/Platelet  Result Value Ref Range   WBC 6.0 3.4 - 10.8 x10E3/uL   RBC 4.68 3.77 - 5.28 x10E6/uL   Hemoglobin 14.5 11.1 - 15.9 g/dL   Hematocrit 29.5 62.1 - 46.6 %   MCV 92 79 - 97 fL   MCH 31.0 26.6 - 33.0 pg   MCHC 33.6 31.5 - 35.7 g/dL   RDW 30.8 65.7 - 84.6 %   Platelets 266 150 - 379 x10E3/uL   Neutrophils 47 Not Estab. %   Lymphs 40 Not Estab. %   Monocytes 10 Not Estab. %   Eos 2 Not Estab. %   Basos 1 Not Estab. %   Neutrophils Absolute 2.9 1.4 - 7.0 x10E3/uL   Lymphocytes Absolute 2.4 0.7 - 3.1 x10E3/uL   Monocytes Absolute 0.6 0.1 - 0.9 x10E3/uL   EOS (ABSOLUTE) 0.1 0.0 - 0.4 x10E3/uL   Basophils Absolute 0.0 0.0 - 0.2 x10E3/uL    Immature Granulocytes 0 Not Estab. %   Immature Grans (Abs) 0.0 0.0 - 0.1 x10E3/uL  Comprehensive metabolic panel  Result Value Ref Range   Glucose 73 65 - 99 mg/dL   BUN 10 6 - 24 mg/dL   Creatinine, Ser 9.62 0.57 - 1.00 mg/dL   GFR calc non Af Amer 89 >59 mL/min/1.73   GFR calc Af Amer 102 >59 mL/min/1.73   BUN/Creatinine Ratio 12 9 - 23   Sodium 139 134 - 144 mmol/L   Potassium 4.1 3.5 - 5.2 mmol/L   Chloride 102 96 - 106 mmol/L   CO2 23 20 - 29 mmol/L   Calcium 9.0 8.7 - 10.2 mg/dL   Total Protein 6.4 6.0 - 8.5 g/dL   Albumin 4.3 3.5 - 5.5 g/dL   Globulin, Total 2.1 1.5 - 4.5 g/dL   Albumin/Globulin Ratio 2.0 1.2 - 2.2   Bilirubin Total 0.5 0.0 - 1.2 mg/dL   Alkaline Phosphatase 64 39 - 117 IU/L   AST 14 0 - 40 IU/L   ALT 17 0 - 32 IU/L  Lipid panel  Result Value Ref Range   Cholesterol, Total 137 100 - 199 mg/dL   Triglycerides 63 0 - 149 mg/dL   HDL 59 >95 mg/dL   VLDL Cholesterol Cal 13 5 - 40 mg/dL   LDL Calculated 65 0 - 99 mg/dL   Chol/HDL Ratio 2.3 0.0 - 4.4 ratio  TSH  Result Value Ref Range   TSH 2.010 0.450 - 4.500 uIU/mL  Urinalysis, Routine w reflex microscopic  Result Value Ref Range   Specific Gravity, UA 1.025 1.005 - 1.030   pH, UA 6.0 5.0 - 7.5   Color, UA Yellow Yellow   Appearance Ur Clear Clear   Leukocytes, UA Negative Negative   Protein, UA Negative Negative/Trace   Glucose, UA Negative Negative   Ketones, UA Negative Negative   RBC, UA 2+ (A) Negative   Bilirubin, UA Negative Negative   Urobilinogen, Ur 0.2 0.2 - 1.0 mg/dL   Nitrite, UA Negative Negative   Microscopic Examination See below:       Assessment & Plan:   Problem List Items Addressed This Visit      Respiratory   Allergic rhinitis - Primary    Start zyrtec and flonase during allergy seasons, humidifier, vapor rubs, sinus rinses prn        Other   Insomnia    Stable, continue current regimen      Attention deficit hyperactivity disorder    Took a short break from  her vyvanse per instructions by teledoctor who treated ear infection as she was concurrently on sudafed. Discussed can restart now that she's stopped the sudafed. Continue current regimen      Anxiety    Stable, continue prn lorazepam       Other Visit Diagnoses    Annual physical exam       Relevant Orders   CBC with Differential/Platelet (Completed)   Comprehensive metabolic panel (Completed)   Lipid panel (Completed)   TSH (Completed)   Urinalysis, Routine w reflex microscopic (Completed)   Screening for breast cancer       Relevant Orders   MM DIGITAL SCREENING BILATERAL   Rash       Post-tick bite. Triamcinolone given for itch relief. Continue to monitor       Follow up plan: Return in about 3 months (around 05/31/2018) for ADHD f/u.   LABORATORY TESTING:  - Pap smear: done through GYN  IMMUNIZATIONS:   - Tdap: Tetanus vaccination status reviewed: last tetanus booster within 10 years. - Influenza: Postponed to flu season  SCREENING: -Mammogram: Ordered today   PATIENT COUNSELING:   Advised to take 1 mg of folate supplement per day if capable of pregnancy.   Sexuality: Discussed sexually transmitted diseases, partner selection, use of condoms, avoidance of unintended pregnancy  and contraceptive alternatives.   Advised to avoid cigarette smoking.  I discussed with the patient that most people either abstain from alcohol or drink within safe limits (<=14/week and <=4 drinks/occasion for males, <=7/weeks and <= 3 drinks/occasion for females) and that the risk for alcohol disorders and other health effects rises proportionally with the number of drinks per week and how often a drinker exceeds daily limits.  Discussed cessation/primary prevention of drug use and availability of treatment for abuse.   Diet: Encouraged to adjust caloric intake to maintain  or achieve ideal body weight, to reduce intake of dietary saturated fat and total fat, to limit sodium intake by  avoiding high sodium foods and not adding table salt, and to maintain adequate dietary potassium and calcium preferably from fresh fruits, vegetables, and low-fat dairy products.    stressed the importance of regular exercise  Injury prevention: Discussed safety belts, safety helmets, smoke detector, smoking near bedding or upholstery.   Dental health: Discussed importance of regular tooth brushing, flossing, and dental visits.    NEXT PREVENTATIVE PHYSICAL DUE IN 1 YEAR. Return in about 3 months (around 05/31/2018) for  ADHD f/u.

## 2018-03-01 LAB — COMPREHENSIVE METABOLIC PANEL
A/G RATIO: 2 (ref 1.2–2.2)
ALK PHOS: 64 IU/L (ref 39–117)
ALT: 17 IU/L (ref 0–32)
AST: 14 IU/L (ref 0–40)
Albumin: 4.3 g/dL (ref 3.5–5.5)
BILIRUBIN TOTAL: 0.5 mg/dL (ref 0.0–1.2)
BUN/Creatinine Ratio: 12 (ref 9–23)
BUN: 10 mg/dL (ref 6–24)
CHLORIDE: 102 mmol/L (ref 96–106)
CO2: 23 mmol/L (ref 20–29)
Calcium: 9 mg/dL (ref 8.7–10.2)
Creatinine, Ser: 0.81 mg/dL (ref 0.57–1.00)
GFR calc Af Amer: 102 mL/min/{1.73_m2} (ref >59)
GFR calc non Af Amer: 89 mL/min/{1.73_m2} (ref >59)
GLUCOSE: 73 mg/dL (ref 65–99)
Globulin, Total: 2.1 g/dL (ref 1.5–4.5)
Potassium: 4.1 mmol/L (ref 3.5–5.2)
Sodium: 139 mmol/L (ref 134–144)
Total Protein: 6.4 g/dL (ref 6.0–8.5)

## 2018-03-01 LAB — CBC WITH DIFFERENTIAL/PLATELET
BASOS ABS: 0 10*3/uL (ref 0.0–0.2)
Basos: 1 %
EOS (ABSOLUTE): 0.1 10*3/uL (ref 0.0–0.4)
Eos: 2 %
Hematocrit: 43.1 % (ref 34.0–46.6)
Hemoglobin: 14.5 g/dL (ref 11.1–15.9)
IMMATURE GRANS (ABS): 0 10*3/uL (ref 0.0–0.1)
Immature Granulocytes: 0 %
LYMPHS: 40 %
Lymphocytes Absolute: 2.4 10*3/uL (ref 0.7–3.1)
MCH: 31 pg (ref 26.6–33.0)
MCHC: 33.6 g/dL (ref 31.5–35.7)
MCV: 92 fL (ref 79–97)
MONOS ABS: 0.6 10*3/uL (ref 0.1–0.9)
Monocytes: 10 %
NEUTROS ABS: 2.9 10*3/uL (ref 1.4–7.0)
Neutrophils: 47 %
PLATELETS: 266 10*3/uL (ref 150–379)
RBC: 4.68 x10E6/uL (ref 3.77–5.28)
RDW: 13.7 % (ref 12.3–15.4)
WBC: 6 10*3/uL (ref 3.4–10.8)

## 2018-03-01 LAB — TSH: TSH: 2.01 u[IU]/mL (ref 0.450–4.500)

## 2018-03-01 LAB — LIPID PANEL
CHOLESTEROL TOTAL: 137 mg/dL (ref 100–199)
Chol/HDL Ratio: 2.3 ratio (ref 0.0–4.4)
HDL: 59 mg/dL (ref >39)
LDL Calculated: 65 mg/dL (ref 0–99)
Triglycerides: 63 mg/dL (ref 0–149)
VLDL CHOLESTEROL CAL: 13 mg/dL (ref 5–40)

## 2018-03-02 DIAGNOSIS — F419 Anxiety disorder, unspecified: Secondary | ICD-10-CM | POA: Insufficient documentation

## 2018-03-02 NOTE — Assessment & Plan Note (Signed)
Stable, continue prn lorazepam

## 2018-03-02 NOTE — Assessment & Plan Note (Signed)
Start zyrtec and flonase during allergy seasons, humidifier, vapor rubs, sinus rinses prn

## 2018-03-02 NOTE — Assessment & Plan Note (Signed)
Stable, continue current regimen 

## 2018-03-02 NOTE — Patient Instructions (Signed)
Follow up in 3 months

## 2018-03-13 ENCOUNTER — Other Ambulatory Visit: Payer: Self-pay | Admitting: Family Medicine

## 2018-03-13 NOTE — Telephone Encounter (Signed)
Refill request for Lorazepam, last filled on 01/30/18 #30.   LOV: 02/28/18 PCP: Dr. Elisabeth Most [address]

## 2018-03-14 MED ORDER — LORAZEPAM 1 MG PO TABS: 1.0000 mg | ORAL_TABLET | Freq: Every day | ORAL | 0 refills | Status: DC | PRN

## 2018-03-14 NOTE — Telephone Encounter (Signed)
Routing to provider  

## 2018-03-14 NOTE — Telephone Encounter (Signed)
Rx refilled, routing to close.

## 2018-04-09 ENCOUNTER — Encounter: Payer: Self-pay | Admitting: Family Medicine

## 2018-04-26 ENCOUNTER — Encounter: Payer: Self-pay | Admitting: Family Medicine

## 2018-04-26 DIAGNOSIS — H9209 Otalgia, unspecified ear: Secondary | ICD-10-CM

## 2018-05-08 ENCOUNTER — Other Ambulatory Visit: Payer: Self-pay | Admitting: Family Medicine

## 2018-05-09 ENCOUNTER — Other Ambulatory Visit: Payer: Self-pay | Admitting: Family Medicine

## 2018-05-09 MED ORDER — LORAZEPAM 1 MG PO TABS: 1.0000 mg | ORAL_TABLET | Freq: Every day | ORAL | 0 refills | Status: DC | PRN

## 2018-05-09 NOTE — Telephone Encounter (Signed)
Your patient 

## 2018-05-31 ENCOUNTER — Ambulatory Visit
Admission: RE | Admit: 2018-05-31 | Discharge: 2018-05-31 | Disposition: A | Discharge status: Home / Self Care | Payer: PRIVATE HEALTH INSURANCE | Source: Ambulatory Visit | Attending: Family Medicine | Admitting: Family Medicine

## 2018-05-31 DIAGNOSIS — Z1231 Encounter for screening mammogram for malignant neoplasm of breast: Secondary | ICD-10-CM | POA: Diagnosis present

## 2018-05-31 DIAGNOSIS — Z1239 Encounter for other screening for malignant neoplasm of breast: Secondary | ICD-10-CM

## 2018-06-08 ENCOUNTER — Encounter: Payer: Self-pay | Admitting: Family Medicine

## 2018-06-11 ENCOUNTER — Encounter: Payer: Self-pay | Admitting: Family Medicine

## 2018-06-11 ENCOUNTER — Ambulatory Visit: Payer: PRIVATE HEALTH INSURANCE | Admitting: Family Medicine

## 2018-06-11 DIAGNOSIS — F909 Attention-deficit hyperactivity disorder, unspecified type: Secondary | ICD-10-CM | POA: Diagnosis not present

## 2018-06-11 DIAGNOSIS — G4700 Insomnia, unspecified: Secondary | ICD-10-CM

## 2018-06-11 DIAGNOSIS — J309 Allergic rhinitis, unspecified: Secondary | ICD-10-CM

## 2018-06-11 MED ORDER — LISDEXAMFETAMINE DIMESYLATE 50 MG PO CAPS: 50.0000 mg | ORAL_CAPSULE | Freq: Every day | ORAL | 0 refills | Status: DC

## 2018-06-11 NOTE — Progress Notes (Signed)
BP 136/86 (BP Location: Left Arm)   Pulse 81   Ht 5\' 5"  (1.651 m)   Wt 248 lb (112.5 kg)   SpO2 99%   BMI 41.27 kg/m    Subjective:    Patient ID: Tammy Mays, female    DOB: [birth year], 42 y.o.   MRN: [id number]  HPI: Tammy Mays is a 42 y.o. female  Chief Complaint  Patient presents with  . ADHD    3 month follow up  . Allergies    Discuss nasal sprays and ongoing symptoms.    Patient follow-up allergies doing rotating therapy seeming to help some allergy seem to come and go. Taking lorazepam on an occasional basis for sleep generally takes a half a tablet and is not hung over is able to function without problems. Taking Vyvanse generally Monday through Thursday as nasal pressure goes up a little bit by nurse at work but not more than 130/80 or so. Patient is concerned about weight gain with stress at work.  Is exercising is interested in some weight loss programs.  Relevant past medical, surgical, family and social history reviewed and updated as indicated. Interim medical history since our last visit reviewed. Allergies and medications reviewed and updated.  Review of Systems  Constitutional: Negative.   Respiratory: Negative.   Cardiovascular: Negative.     Per HPI unless specifically indicated above     Objective:    BP 136/86 (BP Location: Left Arm)   Pulse 81   Ht 5\' 5"  (1.651 m)   Wt 248 lb (112.5 kg)   SpO2 99%   BMI 41.27 kg/m   Wt Readings from Last 3 Encounters:  06/11/18 248 lb (112.5 kg)  02/28/18 225 lb (102.1 kg)  01/30/18 248 lb 3.2 oz (112.6 kg)    Physical Exam  Constitutional: She is oriented to person, place, and time. She appears well-developed and well-nourished.  HENT:  Head: Normocephalic and atraumatic.  Eyes: Conjunctivae and EOM are normal.  Neck: Normal range of motion.  Cardiovascular: Normal rate, regular rhythm and normal heart sounds.  Pulmonary/Chest: Effort normal and breath sounds normal.  Musculoskeletal: Normal  range of motion.  Neurological: She is alert and oriented to person, place, and time.  Skin: No erythema.  Psychiatric: She has a normal mood and affect. Her behavior is normal. Judgment and thought content normal.    Results for orders placed or performed in visit on 02/28/18  Microscopic Examination  Result Value Ref Range   WBC, UA 0-5 0 - 5 /hpf   RBC, UA 0-2 0 - 2 /hpf   Epithelial Cells (non renal) 0-10 0 - 10 /hpf   Mucus, UA Present Not Estab.   Bacteria, UA None seen None seen/Few  CBC with Differential/Platelet  Result Value Ref Range   WBC 6.0 3.4 - 10.8 x10E3/uL   RBC 4.68 3.77 - 5.28 x10E6/uL   Hemoglobin 14.5 11.1 - 15.9 g/dL   Hematocrit 40.943.1 81.134.0 - 46.6 %   MCV 92 79 - 97 fL   MCH 31.0 26.6 - 33.0 pg   MCHC 33.6 31.5 - 35.7 g/dL   RDW 91.413.7 78.212.3 - 95.615.4 %   Platelets 266 150 - 379 x10E3/uL   Neutrophils 47 Not Estab. %   Lymphs 40 Not Estab. %   Monocytes 10 Not Estab. %   Eos 2 Not Estab. %   Basos 1 Not Estab. %   Neutrophils Absolute 2.9 1.4 - 7.0 x10E3/uL   Lymphocytes  Absolute 2.4 0.7 - 3.1 x10E3/uL   Monocytes Absolute 0.6 0.1 - 0.9 x10E3/uL   EOS (ABSOLUTE) 0.1 0.0 - 0.4 x10E3/uL   Basophils Absolute 0.0 0.0 - 0.2 x10E3/uL   Immature Granulocytes 0 Not Estab. %   Immature Grans (Abs) 0.0 0.0 - 0.1 x10E3/uL  Comprehensive metabolic panel  Result Value Ref Range   Glucose 73 65 - 99 mg/dL   BUN 10 6 - 24 mg/dL   Creatinine, Ser 1.610.81 0.57 - 1.00 mg/dL   GFR calc non Af Amer 89 >59 mL/min/1.73   GFR calc Af Amer 102 >59 mL/min/1.73   BUN/Creatinine Ratio 12 9 - 23   Sodium 139 134 - 144 mmol/L   Potassium 4.1 3.5 - 5.2 mmol/L   Chloride 102 96 - 106 mmol/L   CO2 23 20 - 29 mmol/L   Calcium 9.0 8.7 - 10.2 mg/dL   Total Protein 6.4 6.0 - 8.5 g/dL   Albumin 4.3 3.5 - 5.5 g/dL   Globulin, Total 2.1 1.5 - 4.5 g/dL   Albumin/Globulin Ratio 2.0 1.2 - 2.2   Bilirubin Total 0.5 0.0 - 1.2 mg/dL   Alkaline Phosphatase 64 39 - 117 IU/L   AST 14 0 - 40 IU/L    ALT 17 0 - 32 IU/L  Lipid panel  Result Value Ref Range   Cholesterol, Total 137 100 - 199 mg/dL   Triglycerides 63 0 - 149 mg/dL   HDL 59 >09>39 mg/dL   VLDL Cholesterol Cal 13 5 - 40 mg/dL   LDL Calculated 65 0 - 99 mg/dL   Chol/HDL Ratio 2.3 0.0 - 4.4 ratio  TSH  Result Value Ref Range   TSH 2.010 0.450 - 4.500 uIU/mL  Urinalysis, Routine w reflex microscopic  Result Value Ref Range   Specific Gravity, UA 1.025 1.005 - 1.030   pH, UA 6.0 5.0 - 7.5   Color, UA Yellow Yellow   Appearance Ur Clear Clear   Leukocytes, UA Negative Negative   Protein, UA Negative Negative/Trace   Glucose, UA Negative Negative   Ketones, UA Negative Negative   RBC, UA 2+ (A) Negative   Bilirubin, UA Negative Negative   Urobilinogen, Ur 0.2 0.2 - 1.0 mg/dL   Nitrite, UA Negative Negative   Microscopic Examination See below:       Assessment & Plan:   Problem List Items Addressed This Visit      Respiratory   Allergic rhinitis    The current medical regimen is effective;  continue present plan and medications.         Other   Insomnia    Uses as needed lorazepam      Attention deficit hyperactivity disorder    Stable with Vyvanse use          Follow up plan: Return in about 3 months (around 09/11/2018).

## 2018-06-11 NOTE — Assessment & Plan Note (Signed)
The current medical regimen is effective;  continue present plan and medications.  

## 2018-06-11 NOTE — Assessment & Plan Note (Signed)
Stable with Vyvanse use

## 2018-06-11 NOTE — Assessment & Plan Note (Signed)
Uses as needed lorazepam

## 2018-06-13 ENCOUNTER — Other Ambulatory Visit: Payer: Self-pay | Admitting: Family Medicine

## 2018-06-14 MED ORDER — LORAZEPAM 1 MG PO TABS: 1.0000 mg | ORAL_TABLET | Freq: Every day | ORAL | 0 refills | Status: DC | PRN

## 2018-06-14 NOTE — Telephone Encounter (Signed)
Last fill: 05/09/2018 Last Visit: 06/11/2018

## 2018-07-02 ENCOUNTER — Other Ambulatory Visit: Payer: Self-pay | Admitting: Family Medicine

## 2018-07-03 ENCOUNTER — Other Ambulatory Visit: Payer: Self-pay | Admitting: Family Medicine

## 2018-07-04 MED ORDER — TRIAMCINOLONE ACETONIDE 0.1 % EX CREA: 1.0000 "application " | TOPICAL_CREAM | Freq: Two times a day (BID) | CUTANEOUS | 0 refills | Status: DC

## 2018-07-04 MED ORDER — IBUPROFEN 800 MG PO TABS: 800.0000 mg | ORAL_TABLET | Freq: Three times a day (TID) | ORAL | 2 refills | Status: DC

## 2018-07-22 ENCOUNTER — Other Ambulatory Visit: Payer: Self-pay | Admitting: Family Medicine

## 2018-07-23 ENCOUNTER — Other Ambulatory Visit: Payer: Self-pay | Admitting: Family Medicine

## 2018-07-23 MED ORDER — LORAZEPAM 1 MG PO TABS: 1.0000 mg | ORAL_TABLET | Freq: Every day | ORAL | 0 refills | Status: DC | PRN

## 2018-07-23 MED ORDER — LISDEXAMFETAMINE DIMESYLATE 50 MG PO CAPS: 50.0000 mg | ORAL_CAPSULE | Freq: Every day | ORAL | 0 refills | Status: DC

## 2018-08-27 ENCOUNTER — Other Ambulatory Visit: Payer: Self-pay | Admitting: Family Medicine

## 2018-08-27 NOTE — Telephone Encounter (Signed)
Requested medication (s) are due for refill today: yes  Requested medication (s) are on the active medication list: yes    Last refill: 07/23/18  #30  0 refills  Future visit scheduled yes  09/12/18  R. Lane  Notes to clinic:not delegated  Requested Prescriptions  Pending Prescriptions Disp Refills   LORazepam (ATIVAN) 1 MG tablet [Pharmacy Med Name: LORAZEPAM 1MG  TABLETS] 30 tablet 0    Sig: TAKE 1 TABLET(1 MG) BY MOUTH DAILY AS NEEDED     Not Delegated - Psychiatry:  Anxiolytics/Hypnotics Failed - 08/27/2018  5:34 AM      Failed - This refill cannot be delegated      Failed - Urine Drug Screen completed in last 360 days.      Passed - Valid encounter within last 6 months    Recent Outpatient Visits          2 months ago Allergic rhinitis, unspecified seasonality, unspecified trigger   [facility] Crissman, Redge Gainer, [place]   6 months ago Allergic rhinitis, unspecified seasonality, unspecified trigger   [facility] Particia Nearing, [place]   6 months ago Attention deficit hyperactivity disorder (ADHD), unspecified ADHD type   [facility] Roosvelt Maser Bee, [place]   9 months ago Insomnia, unspecified type   [facility], Salley Hews, [place]   1 year ago Attention deficit hyperactivity disorder (ADHD), unspecified ADHD type   [facility], Salley Hews, [place]      Future Appointments            In 2 weeks Maurice March, Salley Hews, PA-C [facility]

## 2018-08-28 ENCOUNTER — Encounter: Payer: Self-pay | Admitting: Family Medicine

## 2018-08-28 MED ORDER — LISDEXAMFETAMINE DIMESYLATE 50 MG PO CAPS: 50.0000 mg | ORAL_CAPSULE | Freq: Every day | ORAL | 0 refills | Status: DC

## 2018-08-28 MED ORDER — LORAZEPAM 1 MG PO TABS: 1.0000 mg | ORAL_TABLET | Freq: Every day | ORAL | 0 refills | Status: DC | PRN

## 2018-09-12 ENCOUNTER — Ambulatory Visit: Payer: PRIVATE HEALTH INSURANCE | Admitting: Family Medicine

## 2018-09-12 ENCOUNTER — Encounter: Payer: Self-pay | Admitting: Family Medicine

## 2018-09-12 VITALS — BP 137/92 | HR 107 | Temp 98.1°F | Wt 258.0 lb

## 2018-09-12 DIAGNOSIS — F419 Anxiety disorder, unspecified: Secondary | ICD-10-CM

## 2018-09-12 DIAGNOSIS — F909 Attention-deficit hyperactivity disorder, unspecified type: Secondary | ICD-10-CM

## 2018-09-12 DIAGNOSIS — J309 Allergic rhinitis, unspecified: Secondary | ICD-10-CM

## 2018-09-12 MED ORDER — MONTELUKAST SODIUM 10 MG PO TABS: 10.0000 mg | ORAL_TABLET | Freq: Every day | ORAL | 6 refills | Status: DC

## 2018-09-12 NOTE — Progress Notes (Signed)
BP (!) 137/92   Pulse (!) 107   Temp 98.1 F (36.7 C) (Oral)   Wt 258 lb (117 kg) Comment: w/ uniform on  SpO2 98%   BMI 42.93 kg/m    Subjective:    Patient ID: Tammy Mays, female    DOB: 12-16-1975, 42 y.o.   MRN: [id number]  HPI: Tammy Mays is a 42 y.o. female  Chief Complaint  Patient presents with  . ADHD    follow up  . Nasal Congestion    Started last wednesday   Here today for ADHD and anxiety follow up.   Continues to do well on vyvanse. Focusing well and completing tasks. Denies CP, SOB, appetite issues.   Taking the ativan for anxiety and sleep, states it's variable with how often she's having to use it. Will go some weeks barely using any and others using it nightly. Has tried other medications for sleep but they make her super groggy.   Having continued issues with congestion, rhinorrhea, ear ringing and popping, post nasal drainage.Taking antihistamines, flonase, and astelin nasal sprays. Known hx of allergic rhinitis.   Past Medical History:  Diagnosis Date  . Abnormal Pap smear of cervix   . Anxiety   . Attention deficit hyperactivity disorder   . Depression   . Insomnia    Social History   Socioeconomic History  . Marital status: Married    Spouse name: Not on file  . Number of children: Not on file  . Years of education: Not on file  . Highest education level: Not on file  Occupational History  . Not on file  Social Needs  . Financial resource strain: Not on file  . Food insecurity:    Worry: Not on file    Inability: Not on file  . Transportation needs:    Medical: Not on file    Non-medical: Not on file  Tobacco Use  . Smoking status: Former Smoker    Types: Cigarettes    Last attempt to quit: 06/29/1999    Years since quitting: 19.2  . Smokeless tobacco: Never Used  Substance and Sexual Activity  . Alcohol use: Yes    Alcohol/week: 0.0 standard drinks    Comment: occasional  . Drug use: No  . Sexual activity: Not on file   Lifestyle  . Physical activity:    Days per week: Not on file    Minutes per session: Not on file  . Stress: Not on file  Relationships  . Social connections:    Talks on phone: Not on file    Gets together: Not on file    Attends religious service: Not on file    Active member of club or organization: Not on file    Attends meetings of clubs or organizations: Not on file    Relationship status: Not on file  . Intimate partner violence:    Fear of current or ex partner: Not on file    Emotionally abused: Not on file    Physically abused: Not on file    Forced sexual activity: Not on file  Other Topics Concern  . Not on file  Social History Narrative  . Not on file   Depression screen [facility] 2/9 02/28/2018 02/28/2018 01/23/2017  Decreased Interest 0 0 0  Down, Depressed, Hopeless 0 0 0  PHQ - 2 Score 0 0 0  Altered sleeping 2 2 -  Tired, decreased energy 0 0 -  Change in appetite 0  0 -  Feeling bad or failure about yourself  0 0 -  Trouble concentrating 0 0 -  Moving slowly or fidgety/restless 0 0 -  Suicidal thoughts 0 0 -  PHQ-9 Score 2 2 -  No flowsheet data found.    Relevant past medical, surgical, family and social history reviewed and updated as indicated. Interim medical history since our last visit reviewed. Allergies and medications reviewed and updated.  Review of Systems  Per HPI unless specifically indicated above     Objective:    BP (!) 137/92   Pulse (!) 107   Temp 98.1 F (36.7 C) (Oral)   Wt 258 lb (117 kg) Comment: w/ uniform on  SpO2 98%   BMI 42.93 kg/m   Wt Readings from Last 3 Encounters:  09/12/18 258 lb (117 kg)  06/11/18 248 lb (112.5 kg)  02/28/18 225 lb (102.1 kg)    Physical Exam  Constitutional: She is oriented to person, place, and time. She appears well-developed and well-nourished. No distress.  HENT:  Head: Atraumatic.  B/l middle ear effusion Oropharynx erythematous posteriorly Nasal mucosa boggy and injected, rhinorrhea  present  Eyes: Conjunctivae and EOM are normal.  Neck: Normal range of motion. Neck supple.  Cardiovascular: Normal rate, regular rhythm and normal heart sounds.  Pulmonary/Chest: Effort normal and breath sounds normal. No respiratory distress. She has no wheezes. She has no rales.  Neurological: She is alert and oriented to person, place, and time.  Skin: Skin is warm and dry.  Psychiatric: She has a normal mood and affect. Her behavior is normal.  Nursing note and vitals reviewed.   Results for orders placed or performed in visit on 02/28/18  Microscopic Examination  Result Value Ref Range   WBC, UA 0-5 0 - 5 /hpf   RBC, UA 0-2 0 - 2 /hpf   Epithelial Cells (non renal) 0-10 0 - 10 /hpf   Mucus, UA Present Not Estab.   Bacteria, UA None seen None seen/Few  CBC with Differential/Platelet  Result Value Ref Range   WBC 6.0 3.4 - 10.8 x10E3/uL   RBC 4.68 3.77 - 5.28 x10E6/uL   Hemoglobin 14.5 11.1 - 15.9 g/dL   Hematocrit 16.1 09.6 - 46.6 %   MCV 92 79 - 97 fL   MCH 31.0 26.6 - 33.0 pg   MCHC 33.6 31.5 - 35.7 g/dL   RDW 04.5 40.9 - 81.1 %   Platelets 266 150 - 379 x10E3/uL   Neutrophils 47 Not Estab. %   Lymphs 40 Not Estab. %   Monocytes 10 Not Estab. %   Eos 2 Not Estab. %   Basos 1 Not Estab. %   Neutrophils Absolute 2.9 1.4 - 7.0 x10E3/uL   Lymphocytes Absolute 2.4 0.7 - 3.1 x10E3/uL   Monocytes Absolute 0.6 0.1 - 0.9 x10E3/uL   EOS (ABSOLUTE) 0.1 0.0 - 0.4 x10E3/uL   Basophils Absolute 0.0 0.0 - 0.2 x10E3/uL   Immature Granulocytes 0 Not Estab. %   Immature Grans (Abs) 0.0 0.0 - 0.1 x10E3/uL  Comprehensive metabolic panel  Result Value Ref Range   Glucose 73 65 - 99 mg/dL   BUN 10 6 - 24 mg/dL   Creatinine, Ser 9.14 0.57 - 1.00 mg/dL   GFR calc non Af Amer 89 >59 mL/min/1.73   GFR calc Af Amer 102 >59 mL/min/1.73   BUN/Creatinine Ratio 12 9 - 23   Sodium 139 134 - 144 mmol/L   Potassium 4.1 3.5 - 5.2 mmol/L  Chloride 102 96 - 106 mmol/L   CO2 23 20 - 29 mmol/L    Calcium 9.0 8.7 - 10.2 mg/dL   Total Protein 6.4 6.0 - 8.5 g/dL   Albumin 4.3 3.5 - 5.5 g/dL   Globulin, Total 2.1 1.5 - 4.5 g/dL   Albumin/Globulin Ratio 2.0 1.2 - 2.2   Bilirubin Total 0.5 0.0 - 1.2 mg/dL   Alkaline Phosphatase 64 39 - 117 IU/L   AST 14 0 - 40 IU/L   ALT 17 0 - 32 IU/L  Lipid panel  Result Value Ref Range   Cholesterol, Total 137 100 - 199 mg/dL   Triglycerides 63 0 - 149 mg/dL   HDL 59 >84>39 mg/dL   VLDL Cholesterol Cal 13 5 - 40 mg/dL   LDL Calculated 65 0 - 99 mg/dL   Chol/HDL Ratio 2.3 0.0 - 4.4 ratio  TSH  Result Value Ref Range   TSH 2.010 0.450 - 4.500 uIU/mL  Urinalysis, Routine w reflex microscopic  Result Value Ref Range   Specific Gravity, UA 1.025 1.005 - 1.030   pH, UA 6.0 5.0 - 7.5   Color, UA Yellow Yellow   Appearance Ur Clear Clear   Leukocytes, UA Negative Negative   Protein, UA Negative Negative/Trace   Glucose, UA Negative Negative   Ketones, UA Negative Negative   RBC, UA 2+ (A) Negative   Bilirubin, UA Negative Negative   Urobilinogen, Ur 0.2 0.2 - 1.0 mg/dL   Nitrite, UA Negative Negative   Microscopic Examination See below:       Assessment & Plan:   Problem List Items Addressed This Visit      Respiratory   Allergic rhinitis    Not under good control. Add singulair, increase zyrtec and flonase to BID. Can continue astelin spray prn        Other   Attention deficit hyperactivity disorder - Primary    Stable on 50 mg vyvanse. Continue current regimen      Anxiety    Stable on prn ativan. Knows to use rarely. Sedation and addiction precautions reviewed          Follow up plan: Return in about 3 months (around 12/13/2018) for ADHD, Anxiety f/u.

## 2018-09-18 NOTE — Assessment & Plan Note (Signed)
Stable on prn ativan. Knows to use rarely. Sedation and addiction precautions reviewed

## 2018-09-18 NOTE — Assessment & Plan Note (Signed)
Not under good control. Add singulair, increase zyrtec and flonase to BID. Can continue astelin spray prn

## 2018-09-18 NOTE — Assessment & Plan Note (Signed)
Stable on 50 mg vyvanse. Continue current regimen

## 2018-10-02 ENCOUNTER — Other Ambulatory Visit: Payer: Self-pay | Admitting: Family Medicine

## 2018-10-02 MED ORDER — LISDEXAMFETAMINE DIMESYLATE 50 MG PO CAPS: 50.0000 mg | ORAL_CAPSULE | Freq: Every day | ORAL | 0 refills | Status: DC

## 2018-10-02 MED ORDER — LORAZEPAM 1 MG PO TABS: 1.0000 mg | ORAL_TABLET | Freq: Every day | ORAL | 2 refills | Status: DC | PRN

## 2018-10-29 ENCOUNTER — Ambulatory Visit (INDEPENDENT_AMBULATORY_CARE_PROVIDER_SITE_OTHER): Payer: PRIVATE HEALTH INSURANCE | Admitting: Obstetrics and Gynecology

## 2018-10-29 ENCOUNTER — Encounter: Payer: Self-pay | Admitting: Obstetrics and Gynecology

## 2018-10-29 ENCOUNTER — Other Ambulatory Visit (HOSPITAL_COMMUNITY)
Admission: RE | Admit: 2018-10-29 | Discharge: 2018-10-29 | Disposition: A | Discharge status: Home / Self Care | Payer: PRIVATE HEALTH INSURANCE | Source: Ambulatory Visit | Attending: Obstetrics and Gynecology | Admitting: Obstetrics and Gynecology

## 2018-10-29 VITALS — BP 130/70 | HR 97 | Ht 65.0 in | Wt 258.0 lb

## 2018-10-29 DIAGNOSIS — Z01419 Encounter for gynecological examination (general) (routine) without abnormal findings: Secondary | ICD-10-CM | POA: Diagnosis not present

## 2018-10-29 DIAGNOSIS — Z975 Presence of (intrauterine) contraceptive device: Secondary | ICD-10-CM | POA: Diagnosis not present

## 2018-10-29 DIAGNOSIS — Z87891 Personal history of nicotine dependence: Secondary | ICD-10-CM | POA: Diagnosis not present

## 2018-10-29 DIAGNOSIS — F909 Attention-deficit hyperactivity disorder, unspecified type: Secondary | ICD-10-CM | POA: Diagnosis not present

## 2018-10-29 DIAGNOSIS — Z124 Encounter for screening for malignant neoplasm of cervix: Secondary | ICD-10-CM

## 2018-10-29 DIAGNOSIS — Z79899 Other long term (current) drug therapy: Secondary | ICD-10-CM | POA: Insufficient documentation

## 2018-10-29 DIAGNOSIS — F419 Anxiety disorder, unspecified: Secondary | ICD-10-CM | POA: Diagnosis not present

## 2018-10-29 DIAGNOSIS — Z1331 Encounter for screening for depression: Secondary | ICD-10-CM

## 2018-10-29 DIAGNOSIS — F329 Major depressive disorder, single episode, unspecified: Secondary | ICD-10-CM | POA: Diagnosis not present

## 2018-10-29 DIAGNOSIS — G4700 Insomnia, unspecified: Secondary | ICD-10-CM | POA: Diagnosis not present

## 2018-10-29 DIAGNOSIS — Z7951 Long term (current) use of inhaled steroids: Secondary | ICD-10-CM | POA: Diagnosis not present

## 2018-10-29 DIAGNOSIS — Z1339 Encounter for screening examination for other mental health and behavioral disorders: Secondary | ICD-10-CM

## 2018-10-29 NOTE — Progress Notes (Signed)
Gynecology Annual Exam  PCP: Particia NearingLane, Rachel Elizabeth, PA-C  Chief Complaint  Patient presents with  . Gynecologic Exam   History of Present Illness:  Ms. Thornell MuleMary K Cole is a 42 y.o. G1P1001 who LMP was Patient's last menstrual period was 10/19/2018 (exact date)., presents today for her annual examination.  Her menses are absent with her IUD.   She does not have vasomotor symptoms.   She is sexually active - IUD for contraception (placed 2015). She does not have vaginal dryness.  Last Pap: 1 year  Results were: no abnormalities /neg HPV DNA.  Hx of STDs: none  Last mammogram: 4 months. Results were: normal--routine follow-up in 12 months There is no FH of breast cancer. There is no FH of ovarian cancer. The patient does not do self-breast exams.  Colonoscopy: n/a DEXA: has not been screened for osteoporosis  Tobacco use: The patient denies current or previous tobacco use. Alcohol use: social drinker Exercise: moderately active  The patient wears seatbelts: yes.     Past Medical History:  Diagnosis Date  . Abnormal Pap smear of cervix   . Anxiety   . Attention deficit hyperactivity disorder   . Depression   . Insomnia     Past Surgical History:  Procedure Laterality Date  . CERVICAL BIOPSY  W/ LOOP ELECTRODE EXCISION    . LEEP    . TONSILLECTOMY      Prior to Admission medications   Medication Sig Start Date End Date Taking? Authorizing Provider  azelastine (ASTELIN) 0.1 % nasal spray U 1 TO 2 SPRAYS IEN BID 05/27/18  Yes [provider]  fluticasone (FLONASE) 50 MCG/ACT nasal spray SHAKE LQ AND U 2 SPRAYS IEN QD 05/27/18  Yes [provider]  lisdexamfetamine (VYVANSE) 50 MG capsule Take 1 capsule (50 mg total) by mouth daily. 10/02/18  Yes Particia NearingLane, Rachel Elizabeth, PA-C  LORazepam (ATIVAN) 1 MG tablet Take 1 tablet (1 mg total) by mouth daily as needed. 10/02/18  Yes Particia NearingLane, Rachel Elizabeth, PA-C  ibuprofen (ADVIL,MOTRIN) 800 MG tablet Take 1 tablet (800 mg  total) by mouth 3 (three) times daily. Patient not taking: Reported on 10/29/2018 07/04/18   Steele Sizerrissman, Mark A, MD  montelukast (SINGULAIR) 10 MG tablet Take 1 tablet (10 mg total) by mouth at bedtime. Patient not taking: Reported on 10/29/2018 09/12/18   Particia NearingLane, Rachel Elizabeth, PA-C    Allergies  Allergen Reactions  . Effexor [Venlafaxine]    Obstetric History: G1P1001, s/p SVD 18 years ago  Family History  Problem Relation Age of Onset  . Hepatitis C Mother   . Breast cancer Neg Hx   . Cancer Neg Hx   . Diabetes Neg Hx   . Heart disease Neg Hx   . Hypertension Neg Hx   . Stroke Neg Hx   . COPD Neg Hx     Social History   Socioeconomic History  . Marital status: Married    Spouse name: Not on file  . Number of children: Not on file  . Years of education: Not on file  . Highest education level: Not on file  Occupational History  . Not on file  Social Needs  . Financial resource strain: Not on file  . Food insecurity:    Worry: Not on file    Inability: Not on file  . Transportation needs:    Medical: Not on file    Non-medical: Not on file  Tobacco Use  . Smoking status: Former Smoker  Types: Cigarettes    Last attempt to quit: 06/29/1999    Years since quitting: 19.3  . Smokeless tobacco: Never Used  Substance and Sexual Activity  . Alcohol use: Yes    Alcohol/week: 0.0 standard drinks    Comment: occasional  . Drug use: No  . Sexual activity: Not on file  Lifestyle  . Physical activity:    Days per week: Not on file    Minutes per session: Not on file  . Stress: Not on file  Relationships  . Social connections:    Talks on phone: Not on file    Gets together: Not on file    Attends religious service: Not on file    Active member of club or organization: Not on file    Attends meetings of clubs or organizations: Not on file    Relationship status: Not on file  . Intimate partner violence:    Fear of current or ex partner: Not on file    Emotionally  abused: Not on file    Physically abused: Not on file    Forced sexual activity: Not on file  Other Topics Concern  . Not on file  Social History Narrative  . Not on file    Review of Systems  Constitutional: Negative.   HENT: Negative.   Eyes: Negative.   Respiratory: Negative.   Cardiovascular: Negative.   Gastrointestinal: Negative.   Genitourinary: Negative.   Musculoskeletal: Negative.   Skin: Negative.   Neurological: Negative.   Psychiatric/Behavioral: Negative.      Physical Exam BP 130/70 (BP Location: Left Arm, Patient Position: Sitting, Cuff Size: Large)   Pulse 97   Ht 5\' 5"  (1.651 m)   Wt 258 lb (117 kg)   LMP 10/19/2018 (Exact Date)   BMI 42.93 kg/m   Physical Exam Constitutional:      General: She is not in acute distress.    Appearance: She is well-developed.  Genitourinary:     Pelvic exam was performed with patient supine.     Uterus normal.     No inguinal adenopathy present in the right or left side.    No signs of injury in the vagina.     No vaginal discharge, erythema, tenderness or bleeding.     No cervical motion tenderness, discharge, lesion or polyp.     Uterus is mobile.     Uterus is not enlarged or tender.     No uterine mass detected.    Uterus is anteverted.     No right or left adnexal mass present.     Right adnexa not tender or full.     Left adnexa not tender or full.  HENT:     Head: Normocephalic and atraumatic.  Eyes:     General: No scleral icterus. Neck:     Musculoskeletal: Normal range of motion and neck supple.     Thyroid: No thyromegaly.  Cardiovascular:     Rate and Rhythm: Normal rate and regular rhythm.     Heart sounds: No murmur. No friction rub. No gallop.   Pulmonary:     Effort: Pulmonary effort is normal. No respiratory distress.     Breath sounds: Normal breath sounds. No wheezing or rales.  Chest:     Breasts:        Right: No inverted nipple, mass, nipple discharge, skin change or tenderness.         Left: No inverted nipple, mass, nipple discharge, skin change or tenderness.  Abdominal:     General: Bowel sounds are normal. There is no distension.     Palpations: Abdomen is soft. There is no mass.     Tenderness: There is no abdominal tenderness. There is no guarding or rebound.  Musculoskeletal: Normal range of motion.        General: No tenderness.  Lymphadenopathy:     Cervical: No cervical adenopathy.     Lower Body: No right inguinal adenopathy. No left inguinal adenopathy.  Neurological:     Mental Status: She is alert and oriented to person, place, and time.     Cranial Nerves: No cranial nerve deficit.  Skin:    General: Skin is warm and dry.     Findings: No erythema or rash.  Psychiatric:        Behavior: Behavior normal.        Judgment: Judgment normal.     Female chaperone present for pelvic and breast  portions of the physical exam  Results: AUDIT Questionnaire (screen for alcoholism): 4 PHQ-9: 1  Assessment: 42 y.o. G65P1001 female here for routine gynecologic examination.  Plan: Problem List Items Addressed This Visit    None    Visit Diagnoses    Women's annual routine gynecological examination    -  Primary   Relevant Orders   Cytology - PAP   Screening for depression       Screening for alcoholism       Pap smear for cervical cancer screening       Relevant Orders   Cytology - PAP      Screening: -- Blood pressure screen normal -- Colonoscopy - not due -- Mammogram - not due -- Weight screening: obese: discussed management options, including lifestyle, dietary, and exercise. -- Depression screening negative (PHQ-9) -- Nutrition: normal -- cholesterol screening: per PCP -- osteoporosis screening: not due -- tobacco screening: not using -- alcohol screening: AUDIT questionnaire indicates low-risk usage. -- family history of breast cancer screening: done. not at high risk. -- no evidence of domestic violence or intimate partner  violence. -- STD screening: gonorrhea/chlamydia NAAT not collected per patient request. -- pap smear collected per ASCCP guidelines -- flu vaccine received -- HPV vaccination series: not eligilbe  Thomasene Mohair, MD 10/30/2018 2:19 PM

## 2018-10-30 ENCOUNTER — Encounter: Payer: Self-pay | Admitting: Obstetrics and Gynecology

## 2018-11-02 ENCOUNTER — Other Ambulatory Visit: Payer: Self-pay | Admitting: Family Medicine

## 2018-11-02 LAB — CYTOLOGY - PAP
DIAGNOSIS: NEGATIVE
HPV (WINDOPATH): NOT DETECTED

## 2018-11-02 MED ORDER — LISDEXAMFETAMINE DIMESYLATE 50 MG PO CAPS: 50.0000 mg | ORAL_CAPSULE | Freq: Every day | ORAL | 0 refills | Status: DC

## 2018-11-21 ENCOUNTER — Encounter: Payer: Self-pay | Admitting: Family Medicine

## 2018-11-22 ENCOUNTER — Ambulatory Visit: Payer: PRIVATE HEALTH INSURANCE | Admitting: Family Medicine

## 2018-11-22 ENCOUNTER — Encounter: Payer: Self-pay | Admitting: Family Medicine

## 2018-11-22 ENCOUNTER — Other Ambulatory Visit: Payer: Self-pay

## 2018-11-22 VITALS — BP 140/102 | HR 85 | Temp 98.8°F | Ht 65.0 in | Wt 250.0 lb

## 2018-11-22 DIAGNOSIS — J111 Influenza due to unidentified influenza virus with other respiratory manifestations: Secondary | ICD-10-CM

## 2018-11-22 DIAGNOSIS — H6983 Other specified disorders of Eustachian tube, bilateral: Secondary | ICD-10-CM

## 2018-11-22 DIAGNOSIS — H6593 Unspecified nonsuppurative otitis media, bilateral: Secondary | ICD-10-CM

## 2018-11-22 LAB — VERITOR FLU A/B WAIVED
INFLUENZA A: NEGATIVE
Influenza B: NEGATIVE

## 2018-11-22 MED ORDER — PREDNISONE 50 MG PO TABS: ORAL_TABLET | ORAL | 0 refills | Status: DC

## 2018-11-22 NOTE — Progress Notes (Signed)
BP (!) 140/102   Pulse 85   Temp 98.8 F (37.1 C) (Oral)   Ht 5\' 5"  (1.651 m)   Wt 250 lb (113.4 kg)   SpO2 98%   BMI 41.60 kg/m    Subjective:    Patient ID: Tammy Mays, female    DOB: Mar 04, 1976, 43 y.o.   MRN: [id number]  HPI: Tammy Mays is a 43 y.o. female  Chief Complaint  Patient presents with  . Cough    since Monday/she caled the online doctor, was given amoxicillin 875mg  tab  . Fever  . Generalized Body Aches  . Nasal Congestion    with green mucus  . Ear Pain    bilateral   Bilateral ear pain, body aches, fevers, cough, and congestion x 5 days. Taking tamiflu and amoxil x 4 days that she was given by a [facility] she called which have not seemed to help. Also taking tylenol and ibuprofen around the clock. Denies CP, wheezing, SOB, N/V/D. Several sick contacts. Hx of eustachian tube dysfunction with multiple ear infections the past year.   Relevant past medical, surgical, family and social history reviewed and updated as indicated. Interim medical history since our last visit reviewed. Allergies and medications reviewed and updated.  Review of Systems  Per HPI unless specifically indicated above     Objective:    BP (!) 140/102   Pulse 85   Temp 98.8 F (37.1 C) (Oral)   Ht 5\' 5"  (1.651 m)   Wt 250 lb (113.4 kg)   SpO2 98%   BMI 41.60 kg/m   Wt Readings from Last 3 Encounters:  11/22/18 250 lb (113.4 kg)  10/29/18 258 lb (117 kg)  09/12/18 258 lb (117 kg)    Physical Exam Vitals signs and nursing note reviewed.  Constitutional:      Appearance: Normal appearance.  HENT:     Head: Atraumatic.     Right Ear: External ear normal. Decreased hearing noted. A middle ear effusion is present. Tympanic membrane is not injected or perforated.     Left Ear: External ear normal. Decreased hearing noted. A middle ear effusion is present. Tympanic membrane is injected and bulging. Tympanic membrane is not perforated.     Nose: Congestion present.      Mouth/Throat:     Mouth: Mucous membranes are moist.     Pharynx: Posterior oropharyngeal erythema present.  Eyes:     Extraocular Movements: Extraocular movements intact.     Conjunctiva/sclera: Conjunctivae normal.  Neck:     Musculoskeletal: Normal range of motion and neck supple.  Cardiovascular:     Rate and Rhythm: Normal rate and regular rhythm.     Heart sounds: Normal heart sounds.  Pulmonary:     Effort: Pulmonary effort is normal.     Breath sounds: Normal breath sounds. No wheezing.  Musculoskeletal: Normal range of motion.  Skin:    General: Skin is warm and dry.  Neurological:     Mental Status: She is alert and oriented to person, place, and time.  Psychiatric:        Mood and Affect: Mood normal.        Thought Content: Thought content normal.    Results for orders placed or performed in visit on 10/29/18  Cytology - PAP  Result Value Ref Range   Adequacy      Satisfactory for evaluation  endocervical/transformation zone component PRESENT.   Diagnosis      NEGATIVE FOR INTRAEPITHELIAL  LESIONS OR MALIGNANCY.   HPV NOT DETECTED    Material Submitted CervicoVaginal Pap [ThinPrep Imaged]    CYTOLOGY - PAP PAP RESULT       Assessment & Plan:   Problem List Items Addressed This Visit    None    Visit Diagnoses    Influenza    -  Primary   Rapid flu neg, but sxs consistent with flu. Complete tamiflu from tel[facility]oc, symptomatic tx reviewed   Relevant Orders   Veritor Flu A/B Waived   Bilateral otitis media with effusion       Unclear if her pain is purely from effusion or if there was a bacterial infection the amoxil has cleared. Complete abx, continue allergy regimen   Relevant Medications   amoxicillin (AMOXIL) 875 MG tablet   Dysfunction of both eustachian tubes       Start prednisone given persistent pain and effusion despite singulair, antihistamine, flonase, and prn sudafed.        Follow up plan: Return if symptoms worsen or fail to  improve.

## 2018-11-23 ENCOUNTER — Ambulatory Visit: Payer: PRIVATE HEALTH INSURANCE | Admitting: Family Medicine

## 2018-12-03 ENCOUNTER — Other Ambulatory Visit: Payer: Self-pay | Admitting: Family Medicine

## 2018-12-03 MED ORDER — LISDEXAMFETAMINE DIMESYLATE 50 MG PO CAPS: 50.0000 mg | ORAL_CAPSULE | Freq: Every day | ORAL | 0 refills | Status: DC

## 2018-12-13 ENCOUNTER — Ambulatory Visit: Payer: PRIVATE HEALTH INSURANCE | Admitting: Family Medicine

## 2018-12-13 ENCOUNTER — Encounter: Payer: Self-pay | Admitting: Family Medicine

## 2018-12-13 ENCOUNTER — Other Ambulatory Visit: Payer: Self-pay

## 2018-12-13 VITALS — BP 136/94 | HR 97 | Temp 97.9°F | Ht 65.0 in | Wt 253.0 lb

## 2018-12-13 DIAGNOSIS — F419 Anxiety disorder, unspecified: Secondary | ICD-10-CM

## 2018-12-13 DIAGNOSIS — J3089 Other allergic rhinitis: Secondary | ICD-10-CM | POA: Diagnosis not present

## 2018-12-13 DIAGNOSIS — F909 Attention-deficit hyperactivity disorder, unspecified type: Secondary | ICD-10-CM | POA: Diagnosis not present

## 2018-12-13 DIAGNOSIS — H6983 Other specified disorders of Eustachian tube, bilateral: Secondary | ICD-10-CM | POA: Diagnosis not present

## 2018-12-13 MED ORDER — CETIRIZINE HCL 10 MG PO TABS: 10.0000 mg | ORAL_TABLET | Freq: Every day | ORAL | 11 refills | Status: DC

## 2018-12-13 NOTE — Progress Notes (Signed)
BP (!) 136/94   Pulse 97   Temp 97.9 F (36.6 C) (Oral)   Ht 5\' 5"  (1.651 m)   Wt 253 lb (114.8 kg)   SpO2 97%   BMI 42.10 kg/m    Subjective:    Patient ID: Tammy Mays, female    DOB: 01-05-1976, 43 y.o.   MRN: [id number]  HPI: Tammy Mays is a 43 y.o. female  Chief Complaint  Patient presents with  . Anxiety    f/u  . ADHD  . Ears check   Here today for 3 month f/u.   ADHD - still doing well on vyvanse regimen daily. Tolerating well without side effects. Helps with focus and task completion significantly.   Taking ativan prn for anxiety and sleep. 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 Gets good relief without side effects.   Prednisone helped with ear pressure significantly from her eustachian tube dysfunction but sxs returned once completed. Not on allergy tabs any longer because she didn't feel they helped. Wanting to go to back to ENT for further management.   Relevant past medical, surgical, family and social history reviewed and updated as indicated. Interim medical history since our last visit reviewed. Allergies and medications reviewed and updated.  Review of Systems  Per HPI unless specifically indicated above     Objective:    BP (!) 136/94   Pulse 97   Temp 97.9 F (36.6 C) (Oral)   Ht 5\' 5"  (1.651 m)   Wt 253 lb (114.8 kg)   SpO2 97%   BMI 42.10 kg/m   Wt Readings from Last 3 Encounters:  12/13/18 253 lb (114.8 kg)  11/22/18 250 lb (113.4 kg)  10/29/18 258 lb (117 kg)    Physical Exam Vitals signs and nursing note reviewed.  Constitutional:      Appearance: Normal appearance. She is not ill-appearing.  HENT:     Head: Atraumatic.     Ears:     Comments: B/l middle ear effusion Eyes:     Extraocular Movements: Extraocular movements intact.     Conjunctiva/sclera: Conjunctivae normal.  Neck:     Musculoskeletal: Normal range of motion and neck supple.  Cardiovascular:     Rate  and Rhythm: Normal rate and regular rhythm.     Heart sounds: Normal heart sounds.  Pulmonary:     Effort: Pulmonary effort is normal.     Breath sounds: Normal breath sounds.  Musculoskeletal: Normal range of motion.  Skin:    General: Skin is warm and dry.  Neurological:     Mental Status: She is alert and oriented to person, place, and time.  Psychiatric:        Mood and Affect: Mood normal.        Thought Content: Thought content normal.        Judgment: Judgment normal.     Results for orders placed or performed in visit on 11/22/18  Veritor Flu A/B Waived  Result Value Ref Range   Influenza A Negative Negative   Influenza B Negative Negative      Assessment & Plan:   Problem List Items Addressed This Visit      Respiratory   Allergic rhinitis    Restart zyrtec and flonase, add singulair back. Will schedule with ENT again if not improved after several weeks        Other   Attention deficit hyperactivity disorder  Stable on vyvanse, continue current regimen      Anxiety - Primary    Stable on ativan prn. Continue current regimen       Other Visit Diagnoses    Eustachian tube dysfunction, bilateral       Restart zyrtec and flonase, add singulair back. Will schedule with ENT again if not improved after several weeks       Follow up plan: Return in about 3 months (around 03/13/2019) for ADHD, anxiety f/u.

## 2018-12-18 NOTE — Assessment & Plan Note (Signed)
Stable on vyvanse, continue current regimen

## 2018-12-18 NOTE — Assessment & Plan Note (Signed)
Stable on ativan prn. Continue current regimen

## 2018-12-18 NOTE — Assessment & Plan Note (Signed)
Restart zyrtec and flonase, add singulair back. Will schedule with ENT again if not improved after several weeks

## 2019-01-08 ENCOUNTER — Other Ambulatory Visit: Payer: Self-pay | Admitting: Family Medicine

## 2019-01-09 NOTE — Telephone Encounter (Signed)
Requested medication (s) are due for refill today: yes  Requested medication (s) are on the active medication list: yes  Last refill:  10/03/2019  Future visit scheduled: yes  Notes to clinic:  Not delegated    Requested Prescriptions  Pending Prescriptions Disp Refills   LORazepam (ATIVAN) 1 MG tablet [Pharmacy Med Name: LORAZEPAM 1MG  TABLETS] 30 tablet     Sig: TAKE 1 TABLET(1 MG) BY MOUTH DAILY AS NEEDED     Not Delegated - Psychiatry:  Anxiolytics/Hypnotics Failed - 01/08/2019 11:08 PM      Failed - This refill cannot be delegated      Failed - Urine Drug Screen completed in last 360 days.      Passed - Valid encounter within last 6 months    Recent Outpatient Visits          3 weeks ago Anxiety   [facility] Ro[place]velt Maser [place], [place]   1 month ago Influenza   [facility] Roosvelt Maser [place], [place]   3 months ago Attention deficit hyperactivity disorder (ADHD), unspecified ADHD type   [facility] Roosvelt Maser [place], [place]   7 months ago Allergic rhinitis, unspecified seasonality, unspecified trigger   [facility] [place], Redge Gainer, [place]   10 months ago Allergic rhinitis, unspecified seasonality, unspecified trigger   [facility] [place], [place]      Future Appointments            In 2 months Maurice March, Salley Hews, PA-C [facility]

## 2019-01-31 ENCOUNTER — Other Ambulatory Visit: Payer: Self-pay | Admitting: Family Medicine

## 2019-02-01 MED ORDER — LISDEXAMFETAMINE DIMESYLATE 50 MG PO CAPS: 50.0000 mg | ORAL_CAPSULE | Freq: Every day | ORAL | 0 refills | Status: DC

## 2019-02-21 ENCOUNTER — Other Ambulatory Visit: Payer: Self-pay | Admitting: Family Medicine

## 2019-02-21 ENCOUNTER — Encounter: Payer: Self-pay | Admitting: Family Medicine

## 2019-03-13 ENCOUNTER — Encounter: Payer: PRIVATE HEALTH INSURANCE | Admitting: Family Medicine

## 2019-03-14 ENCOUNTER — Other Ambulatory Visit: Payer: Self-pay | Admitting: Family Medicine

## 2019-03-14 MED ORDER — LISDEXAMFETAMINE DIMESYLATE 50 MG PO CAPS: 50.0000 mg | ORAL_CAPSULE | Freq: Every day | ORAL | 0 refills | Status: DC

## 2019-03-14 NOTE — Telephone Encounter (Signed)
Routing to provider  

## 2019-03-21 ENCOUNTER — Other Ambulatory Visit: Payer: Self-pay

## 2019-03-21 ENCOUNTER — Ambulatory Visit (INDEPENDENT_AMBULATORY_CARE_PROVIDER_SITE_OTHER): Payer: PRIVATE HEALTH INSURANCE | Admitting: Family Medicine

## 2019-03-21 ENCOUNTER — Encounter: Payer: Self-pay | Admitting: Family Medicine

## 2019-03-21 VITALS — BP 130/94 | HR 79 | Temp 98.5°F

## 2019-03-21 DIAGNOSIS — J3089 Other allergic rhinitis: Secondary | ICD-10-CM | POA: Diagnosis not present

## 2019-03-21 DIAGNOSIS — F419 Anxiety disorder, unspecified: Secondary | ICD-10-CM

## 2019-03-21 DIAGNOSIS — Z0000 Encounter for general adult medical examination without abnormal findings: Secondary | ICD-10-CM | POA: Diagnosis not present

## 2019-03-21 DIAGNOSIS — R030 Elevated blood-pressure reading, without diagnosis of hypertension: Secondary | ICD-10-CM

## 2019-03-21 DIAGNOSIS — G4700 Insomnia, unspecified: Secondary | ICD-10-CM

## 2019-03-21 DIAGNOSIS — F909 Attention-deficit hyperactivity disorder, unspecified type: Secondary | ICD-10-CM

## 2019-03-21 DIAGNOSIS — Z1239 Encounter for other screening for malignant neoplasm of breast: Secondary | ICD-10-CM | POA: Diagnosis not present

## 2019-03-21 LAB — UA/M W/RFLX CULTURE, ROUTINE
Bilirubin, UA: NEGATIVE
Glucose, UA: NEGATIVE
Leukocytes,UA: NEGATIVE
Nitrite, UA: NEGATIVE
Protein,UA: NEGATIVE
RBC, UA: NEGATIVE
Specific Gravity, UA: 1.015 (ref 1.005–1.030)
Urobilinogen, Ur: 0.2 mg/dL (ref 0.2–1.0)
pH, UA: 7 (ref 5.0–7.5)

## 2019-03-21 NOTE — Assessment & Plan Note (Signed)
Under good control with vyvanse 50 mg. Continue current regimen

## 2019-03-21 NOTE — Assessment & Plan Note (Signed)
Stable and well controlled with prn ativan and good sleep hygiene. Continue current regimen

## 2019-03-21 NOTE — Assessment & Plan Note (Signed)
Seems to be related to vyvanse after initially taking every day, and also during major stress. Continue to monitor closely. Call with persistently abnormal readings.

## 2019-03-21 NOTE — Assessment & Plan Note (Signed)
Takes ativan prn, well controlled. Continue current regimen

## 2019-03-21 NOTE — Progress Notes (Signed)
BP (!) 130/94   Pulse 79   Temp 98.5 F (36.9 C) (Oral)   SpO2 94%    Subjective:    Patient ID: Tammy Mays, female    DOB: [birth year], 43 y.o.   MRN: [id number]  HPI: ANGELIN Mays is a 43 y.o. female presenting on 03/21/2019 for comprehensive medical examination. Current medical complaints include:see below  BPs have been running well when checked at home. States the mornings right after she takes the vyvanse they run a bit high but normalize by lunchtime. Stays active and tries to eat healthy. Denies CP, HAs, dizziness, SOB.   Allergies doing much better, faithful with the singulair, zyrtec, flonase daily. No further ear pressure/popping, congestion, cough.   Vyvanse continues to do very well for her ADHD. Focusing well at work, able to stay on task. Denies side effects, including sleep, appetite, palpitations, CP.   Taking ativan prn for anxiety and sleep. Stable on this for years. No side effects or issues.   She currently lives with: Menopausal Symptoms: no  Depression Screen done today and results listed below:  Depression screen [facility] 2/9 03/21/2019 12/13/2018 02/28/2018 02/28/2018 01/23/2017  Decreased Interest 0 0 0 0 0  Down, Depressed, Hopeless 0 0 0 0 0  PHQ - 2 Score 0 0 0 0 0  Altered sleeping 0 -  Tired, decreased energy 0 1 0 0 -  Change in appetite 0 1 0 0 -  Feeling bad or failure about yourself  0 0 0 0 -  Trouble concentrating 0 0 0 0 -  Moving slowly or fidgety/restless 0 0 0 0 -  Suicidal thoughts 0 0 0 0 -  PHQ-9 Score 0 -  Difficult doing work/chores Not difficult at all - - - -    The patient does not have a history of falls. I did not complete a risk assessment for falls. A plan of care for falls was not documented.   Past Medical History:  Past Medical History:  Diagnosis Date  . Abnormal Pap smear of cervix   . Anxiety   . Attention deficit hyperactivity disorder   . Depression   . Insomnia     Surgical History:  Past Surgical  History:  Procedure Laterality Date  . CERVICAL BIOPSY  W/ LOOP ELECTRODE EXCISION    . LEEP    . TONSILLECTOMY      Medications:  Current Outpatient Medications on File Prior to Visit  Medication Sig  . azelastine (ASTELIN) 0.1 % nasal spray U 1 TO 2 SPRAYS IEN BID  . cetirizine (ZYRTEC) 10 MG tablet Take 1 tablet (10 mg total) by mouth daily.  . fluticasone (FLONASE) 50 MCG/ACT nasal spray SHAKE LQ AND U 2 SPRAYS IEN QD  . lisdexamfetamine (VYVANSE) 50 MG capsule Take 1 capsule (50 mg total) by mouth daily.  Marland Kitchen lisdexamfetamine (VYVANSE) 50 MG capsule Take 1 capsule (50 mg total) by mouth daily.  Marland Kitchen LORazepam (ATIVAN) 1 MG tablet TAKE 1 TABLET(1 MG) BY MOUTH DAILY AS NEEDED  . montelukast (SINGULAIR) 10 MG tablet Take 1 tablet (10 mg total) by mouth at bedtime.  Marland Kitchen ibuprofen (ADVIL,MOTRIN) 800 MG tablet Take 1 tablet (800 mg total) by mouth 3 (three) times daily. (Patient not taking: Reported on 03/21/2019)   No current facility-administered medications on file prior to visit.     Allergies:  Allergies  Allergen Reactions  . Effexor [Venlafaxine]     Social History:  Social History   Socioeconomic History  . Marital status: Married    Spouse name: Not on file  . Number of children: Not on file  . Years of education: Not on file  . Highest education level: Not on file  Occupational History  . Not on file  Social Needs  . Financial resource strain: Not on file  . Food insecurity:    Worry: Not on file    Inability: Not on file  . Transportation needs:    Medical: Not on file    Non-medical: Not on file  Tobacco Use  . Smoking status: Former Smoker    Types: Cigarettes    Last attempt to quit: 06/29/1999    Years since quitting: 19.7  . Smokeless tobacco: Never Used  Substance and Sexual Activity  . Alcohol use: Yes    Alcohol/week: 0.0 standard drinks    Comment: occasional  . Drug use: No  . Sexual activity: Not on file  Lifestyle  . Physical activity:    Days  per week: Not on file    Minutes per session: Not on file  . Stress: Not on file  Relationships  . Social connections:    Talks on phone: Not on file    Gets together: Not on file    Attends religious service: Not on file    Active member of club or organization: Not on file    Attends meetings of clubs or organizations: Not on file    Relationship status: Not on file  . Intimate partner violence:    Fear of current or ex partner: Not on file    Emotionally abused: Not on file    Physically abused: Not on file    Forced sexual activity: Not on file  Other Topics Concern  . Not on file  Social History Narrative  . Not on file   Social History   Tobacco Use  Smoking Status Former Smoker  . Types: Cigarettes  . Last attempt to quit: 06/29/1999  . Years since quitting: 19.7  Smokeless Tobacco Never Used   Social History   Substance and Sexual Activity  Alcohol Use Yes  . Alcohol/week: 0.0 standard drinks   Comment: occasional    Family History:  Family History  Problem Relation Age of Onset  . Hepatitis C Mother   . Breast cancer Neg Hx   . Cancer Neg Hx   . Diabetes Neg Hx   . Heart disease Neg Hx   . Hypertension Neg Hx   . Stroke Neg Hx   . COPD Neg Hx     Past medical history, surgical history, medications, allergies, family history and social history reviewed with patient today and changes made to appropriate areas of the chart.   Review of Systems - General ROS: negative Psychological ROS: negative Ophthalmic ROS: negative ENT ROS: negative Allergy and Immunology ROS: negative Hematological and Lymphatic ROS: negative Endocrine ROS: negative Breast ROS: negative for breast lumps Respiratory ROS: no cough, shortness of breath, or wheezing Cardiovascular ROS: no chest pain or dyspnea on exertion Gastrointestinal ROS: no abdominal pain, change in bowel habits, or black or bloody stools Genito-Urinary ROS: no dysuria, trouble voiding, or  hematuria Musculoskeletal ROS: negative Neurological ROS: no TIA or stroke symptoms Dermatological ROS: negative All other ROS negative except what is listed above and in the HPI.      Objective:    BP (!) 130/94   Pulse 79   Temp 98.5 F (36.9 C) (  Oral)   SpO2 94%   Wt Readings from Last 3 Encounters:  12/13/18 253 lb (114.8 kg)  11/22/18 250 lb (113.4 kg)  10/29/18 258 lb (117 kg)    Physical Exam Vitals signs and nursing note reviewed.  Constitutional:      General: She is not in acute distress.    Appearance: She is well-developed.  HENT:     Head: Atraumatic.     Right Ear: External ear normal.     Left Ear: External ear normal.     Nose: Nose normal.     Mouth/Throat:     Pharynx: No oropharyngeal exudate.  Eyes:     General: No scleral icterus.    Conjunctiva/sclera: Conjunctivae normal.     Pupils: Pupils are equal, round, and reactive to light.  Neck:     Musculoskeletal: Normal range of motion and neck supple.     Thyroid: No thyromegaly.  Cardiovascular:     Rate and Rhythm: Normal rate and regular rhythm.     Heart sounds: Normal heart sounds.  Pulmonary:     Effort: Pulmonary effort is normal. No respiratory distress.     Breath sounds: Normal breath sounds.  Chest:     Comments: Breast exam done through GYN Abdominal:     General: Bowel sounds are normal.     Palpations: Abdomen is soft. There is no mass.     Tenderness: There is no abdominal tenderness.  Genitourinary:    Comments: Done through GYN Musculoskeletal: Normal range of motion.        General: No tenderness.  Lymphadenopathy:     Cervical: No cervical adenopathy.  Skin:    General: Skin is warm and dry.     Findings: No rash.  Neurological:     Mental Status: She is alert and oriented to person, place, and time.     Cranial Nerves: No cranial nerve deficit.  Psychiatric:        Behavior: Behavior normal.     Results for orders placed or performed in visit on 03/21/19  UA/M  w/rflx Culture, Routine  Result Value Ref Range   Specific Gravity, UA 1.015 1.005 - 1.030   pH, UA 7.0 5.0 - 7.5   Color, UA Yellow Yellow   Appearance Ur Clear Clear   Leukocytes,UA Negative Negative   Protein,UA Negative Negative/Trace   Glucose, UA Negative Negative   Ketones, UA Trace (A) Negative   RBC, UA Negative Negative   Bilirubin, UA Negative Negative   Urobilinogen, Ur 0.2 0.2 - 1.0 mg/dL   Nitrite, UA Negative Negative      Assessment & Plan:   Problem List Items Addressed This Visit      Cardiovascular and Mediastinum   Elevated blood pressure, situational    Seems to be related to vyvanse after initially taking every day, and also during major stress. Continue to monitor closely. Call with persistently abnormal readings.         Respiratory   Allergic rhinitis - Primary    Under excellent control, continue current regimen        Other   Insomnia    Stable and well controlled with prn ativan and good sleep hygiene. Continue current regimen      Attention deficit hyperactivity disorder    Under good control with vyvanse 50 mg. Continue current regimen      Anxiety    Takes ativan prn, well controlled. Continue current regimen  Other Visit Diagnoses    Annual physical exam       Relevant Orders   CBC with Differential/Platelet   Comprehensive metabolic panel   Lipid Panel w/o Chol/HDL Ratio   TSH   UA/M w/rflx Culture, Routine (Completed)   Screening for breast cancer       Relevant Orders   MM DIGITAL SCREENING BILATERAL       Follow up plan: Return in about 3 months (around 06/21/2019) for ADHD, sleep f/u.   LABORATORY TESTING:  - Pap smear: up to date  IMMUNIZATIONS:   - Tdap: Tetanus vaccination status reviewed: last tetanus booster within 10 years. - Influenza: Up to date  SCREENING: -Mammogram: Ordered today   PATIENT COUNSELING:   Advised to take 1 mg of folate supplement per day if capable of pregnancy.   Sexuality:  Discussed sexually transmitted diseases, partner selection, use of condoms, avoidance of unintended pregnancy  and contraceptive alternatives.   Advised to avoid cigarette smoking.  I discussed with the patient that most people either abstain from alcohol or drink within safe limits (<=14/week and <=4 drinks/occasion for males, <=7/weeks and <= 3 drinks/occasion for females) and that the risk for alcohol disorders and other health effects rises proportionally with the number of drinks per week and how often a drinker exceeds daily limits.  Discussed cessation/primary prevention of drug use and availability of treatment for abuse.   Diet: Encouraged to adjust caloric intake to maintain  or achieve ideal body weight, to reduce intake of dietary saturated fat and total fat, to limit sodium intake by avoiding high sodium foods and not adding table salt, and to maintain adequate dietary potassium and calcium preferably from fresh fruits, vegetables, and low-fat dairy products.    stressed the importance of regular exercise  Injury prevention: Discussed safety belts, safety helmets, smoke detector, smoking near bedding or upholstery.   Dental health: Discussed importance of regular tooth brushing, flossing, and dental visits.    NEXT PREVENTATIVE PHYSICAL DUE IN 1 YEAR. Return in about 3 months (around 06/21/2019) for ADHD, sleep f/u.

## 2019-03-21 NOTE — Assessment & Plan Note (Signed)
Under excellent control, continue current regimen

## 2019-03-22 LAB — COMPREHENSIVE METABOLIC PANEL
ALT: 15 IU/L (ref 0–32)
AST: 16 IU/L (ref 0–40)
Albumin/Globulin Ratio: 2.3 — ABNORMAL HIGH (ref 1.2–2.2)
Albumin: 4.8 g/dL (ref 3.8–4.8)
Alkaline Phosphatase: 65 IU/L (ref 39–117)
BUN/Creatinine Ratio: 15 (ref 9–23)
BUN: 12 mg/dL (ref 6–24)
Bilirubin Total: 0.6 mg/dL (ref 0.0–1.2)
CO2: 21 mmol/L (ref 20–29)
Calcium: 9.4 mg/dL (ref 8.7–10.2)
Chloride: 100 mmol/L (ref 96–106)
Creatinine, Ser: 0.8 mg/dL (ref 0.57–1.00)
GFR calc Af Amer: 103 mL/min/{1.73_m2} (ref >59)
GFR calc non Af Amer: 89 mL/min/{1.73_m2} (ref >59)
Globulin, Total: 2.1 g/dL (ref 1.5–4.5)
Glucose: 81 mg/dL (ref 65–99)
Potassium: 4.6 mmol/L (ref 3.5–5.2)
Sodium: 136 mmol/L (ref 134–144)
Total Protein: 6.9 g/dL (ref 6.0–8.5)

## 2019-03-22 LAB — CBC WITH DIFFERENTIAL/PLATELET
Basophils Absolute: 0 10*3/uL (ref 0.0–0.2)
Basos: 1 %
EOS (ABSOLUTE): 0.2 10*3/uL (ref 0.0–0.4)
Eos: 2 %
Hematocrit: 47 % — ABNORMAL HIGH (ref 34.0–46.6)
Hemoglobin: 16.1 g/dL — ABNORMAL HIGH (ref 11.1–15.9)
Immature Grans (Abs): 0 10*3/uL (ref 0.0–0.1)
Immature Granulocytes: 0 %
Lymphocytes Absolute: 2.1 10*3/uL (ref 0.7–3.1)
Lymphs: 30 %
MCH: 30.7 pg (ref 26.6–33.0)
MCHC: 34.3 g/dL (ref 31.5–35.7)
MCV: 90 fL (ref 79–97)
Monocytes Absolute: 0.7 10*3/uL (ref 0.1–0.9)
Monocytes: 10 %
Neutrophils Absolute: 3.9 10*3/uL (ref 1.4–7.0)
Neutrophils: 57 %
Platelets: 284 10*3/uL (ref 150–450)
RBC: 5.25 x10E6/uL (ref 3.77–5.28)
RDW: 12.4 % (ref 11.7–15.4)
WBC: 6.9 10*3/uL (ref 3.4–10.8)

## 2019-03-22 LAB — TSH: TSH: 1.63 u[IU]/mL (ref 0.450–4.500)

## 2019-03-22 LAB — LIPID PANEL W/O CHOL/HDL RATIO
Cholesterol, Total: 132 mg/dL (ref 100–199)
HDL: 50 mg/dL (ref >39)
LDL Calculated: 64 mg/dL (ref 0–99)
Triglycerides: 90 mg/dL (ref 0–149)
VLDL Cholesterol Cal: 18 mg/dL (ref 5–40)

## 2019-04-15 ENCOUNTER — Other Ambulatory Visit: Payer: Self-pay | Admitting: Family Medicine

## 2019-04-15 NOTE — Telephone Encounter (Signed)
Please advise 

## 2019-04-16 MED ORDER — LISDEXAMFETAMINE DIMESYLATE 50 MG PO CAPS: 50.0000 mg | ORAL_CAPSULE | Freq: Every day | ORAL | 0 refills | Status: DC

## 2019-05-21 ENCOUNTER — Other Ambulatory Visit: Payer: Self-pay | Admitting: Family Medicine

## 2019-05-21 NOTE — Telephone Encounter (Signed)
Requested medications are due for refill today?  Yes  Requested medications are on the active medication list?  Yes  Last refill 04/16/2019  Future visit scheduled?  Yes - scheduled for 06/21/2019 for follow up  Notes to clinic   Requested Prescriptions  Pending Prescriptions Disp Refills   LORazepam (ATIVAN) 1 MG tablet [Pharmacy Med Name: LORAZEPAM 1MG  TABLETS] 30 tablet     Sig: TAKE 1 TABLET(1 MG) BY MOUTH DAILY AS NEEDED     Not Delegated - Psychiatry:  Anxiolytics/Hypnotics Failed - 05/21/2019 12:48 PM      Failed - This refill cannot be delegated      Failed - Urine Drug Screen completed in last 360 days.      Passed - Valid encounter within last 6 months    Recent Outpatient Visits          2 months ago Non-seasonal allergic rhinitis due to other allergic trigger   [facility] [place], [place]   5 months ago [place], [place], [place]   6 months ago Influenza   [place], [place], [place]   8 months ago Attention deficit hyperactivity disorder (ADHD), unspecified ADHD type   [place], [place], [place]   11 months ago Allergic rhinitis, unspecified seasonality, unspecified trigger   [place], [place]      Future Appointments            In 1 month Orene Desanctis, Lilia Argue, [place], [place]

## 2019-05-22 ENCOUNTER — Encounter: Payer: Self-pay | Admitting: Family Medicine

## 2019-05-23 MED ORDER — AZELASTINE HCL 0.1 % NA SOLN: NASAL | 12 refills | Status: DC

## 2019-05-24 ENCOUNTER — Other Ambulatory Visit: Payer: Self-pay | Admitting: Family Medicine

## 2019-05-24 ENCOUNTER — Encounter: Payer: Self-pay | Admitting: Family Medicine

## 2019-05-24 MED ORDER — LISDEXAMFETAMINE DIMESYLATE 50 MG PO CAPS: 50.0000 mg | ORAL_CAPSULE | Freq: Every day | ORAL | 0 refills | Status: DC

## 2019-05-24 MED ORDER — FLUTICASONE PROPIONATE 50 MCG/ACT NA SUSP: NASAL | 12 refills | Status: DC

## 2019-06-21 ENCOUNTER — Ambulatory Visit: Payer: PRIVATE HEALTH INSURANCE | Admitting: Family Medicine

## 2019-06-21 ENCOUNTER — Other Ambulatory Visit: Payer: Self-pay | Admitting: Family Medicine

## 2019-06-22 NOTE — Telephone Encounter (Signed)
Forwarding medication refill request to PCP for review. 

## 2019-06-26 ENCOUNTER — Encounter: Payer: Self-pay | Admitting: Family Medicine

## 2019-06-26 ENCOUNTER — Other Ambulatory Visit: Payer: Self-pay

## 2019-06-26 ENCOUNTER — Ambulatory Visit: Payer: PRIVATE HEALTH INSURANCE | Admitting: Family Medicine

## 2019-06-26 VITALS — BP 138/93 | HR 71 | Temp 98.1°F | Ht 65.0 in | Wt 212.0 lb

## 2019-06-26 DIAGNOSIS — Z23 Encounter for immunization: Secondary | ICD-10-CM | POA: Diagnosis not present

## 2019-06-26 DIAGNOSIS — F909 Attention-deficit hyperactivity disorder, unspecified type: Secondary | ICD-10-CM | POA: Diagnosis not present

## 2019-06-26 DIAGNOSIS — G4700 Insomnia, unspecified: Secondary | ICD-10-CM

## 2019-06-26 MED ORDER — MONTELUKAST SODIUM 10 MG PO TABS: 10.0000 mg | ORAL_TABLET | Freq: Every day | ORAL | 11 refills | Status: DC

## 2019-06-26 MED ORDER — LORAZEPAM 1 MG PO TABS: 1.0000 mg | ORAL_TABLET | Freq: Every day | ORAL | 2 refills | Status: DC | PRN

## 2019-06-26 MED ORDER — LISDEXAMFETAMINE DIMESYLATE 50 MG PO CAPS: 50.0000 mg | ORAL_CAPSULE | Freq: Every day | ORAL | 0 refills | Status: DC

## 2019-06-26 NOTE — Progress Notes (Signed)
BP (!) 138/93   Pulse 71   Temp 98.1 F (36.7 C) (Oral)   Ht 5\' 5"  (1.651 m)   Wt 212 lb (96.2 kg)   BMI 35.28 kg/m    Subjective:    Patient ID: Tammy Mays, female    DOB: 08-10-1976, 43 y.o.   MRN: [id number]  HPI: Tammy Mays is a 43 y.o. female  Chief Complaint  Patient presents with  . ADHD    5628m f/u  . Insomnia   Patient here today for 3 month insomnia and ADHD f/u. Having to take the lorazepam most nights right now due to significant work stress. States it works well for her without side effects. Very happy with the vyvanse, feels it helps significantly with her focus and productivity at work. Has a lot on her plate right now and feels this contributes greatly to her ability to keep things in line at work. Denies CP, SOB, palpitations, appetite issues.   Relevant past medical, surgical, family and social history reviewed and updated as indicated. Interim medical history since our last visit reviewed. Allergies and medications reviewed and updated.  Review of Systems  Per HPI unless specifically indicated above     Objective:    BP (!) 138/93   Pulse 71   Temp 98.1 F (36.7 C) (Oral)   Ht 5\' 5"  (1.651 m)   Wt 212 lb (96.2 kg)   BMI 35.28 kg/m   Wt Readings from Last 3 Encounters:  06/26/19 212 lb (96.2 kg)  12/13/18 253 lb (114.8 kg)  11/22/18 250 lb (113.4 kg)    Physical Exam Vitals signs and nursing note reviewed.  Constitutional:      Appearance: Normal appearance. She is not ill-appearing.  HENT:     Head: Atraumatic.  Eyes:     Extraocular Movements: Extraocular movements intact.     Conjunctiva/sclera: Conjunctivae normal.  Neck:     Musculoskeletal: Normal range of motion and neck supple.  Cardiovascular:     Rate and Rhythm: Normal rate and regular rhythm.     Heart sounds: Normal heart sounds.  Pulmonary:     Effort: Pulmonary effort is normal.     Breath sounds: Normal breath sounds.  Musculoskeletal: Normal range of motion.  Skin:     General: Skin is warm and dry.  Neurological:     Mental Status: She is alert and oriented to person, place, and time.  Psychiatric:        Mood and Affect: Mood normal.        Thought Content: Thought content normal.        Judgment: Judgment normal.     Results for orders placed or performed in visit on 03/21/19  CBC with Differential/Platelet  Result Value Ref Range   WBC 6.9 3.4 - 10.8 x10E3/uL   RBC 5.25 3.77 - 5.28 x10E6/uL   Hemoglobin 16.1 (H) 11.1 - 15.9 g/dL   Hematocrit 47.847.0 (H) 29.534.0 - 46.6 %   MCV 90 79 - 97 fL   MCH 30.7 26.6 - 33.0 pg   MCHC 34.3 31.5 - 35.7 g/dL   RDW 62.112.4 30.811.7 - 65.715.4 %   Platelets 284 150 - 450 x10E3/uL   Neutrophils 57 Not Estab. %   Lymphs 30 Not Estab. %   Monocytes 10 Not Estab. %   Eos 2 Not Estab. %   Basos 1 Not Estab. %   Neutrophils Absolute 3.9 1.4 - 7.0 x10E3/uL   Lymphocytes Absolute  2.1 0.7 - 3.1 x10E3/uL   Monocytes Absolute 0.7 0.1 - 0.9 x10E3/uL   EOS (ABSOLUTE) 0.2 0.0 - 0.4 x10E3/uL   Basophils Absolute 0.0 0.0 - 0.2 x10E3/uL   Immature Granulocytes 0 Not Estab. %   Immature Grans (Abs) 0.0 0.0 - 0.1 x10E3/uL  Comprehensive metabolic panel  Result Value Ref Range   Glucose 81 65 - 99 mg/dL   BUN 12 6 - 24 mg/dL   Creatinine, Ser 0.80 0.57 - 1.00 mg/dL   GFR calc non Af Amer 89 >59 mL/min/1.73   GFR calc Af Amer 103 >59 mL/min/1.73   BUN/Creatinine Ratio 15 9 - 23   Sodium 136 134 - 144 mmol/L   Potassium 4.6 3.5 - 5.2 mmol/L   Chloride 100 96 - 106 mmol/L   CO2 21 20 - 29 mmol/L   Calcium 9.4 8.7 - 10.2 mg/dL   Total Protein 6.9 6.0 - 8.5 g/dL   Albumin 4.8 3.8 - 4.8 g/dL   Globulin, Total 2.1 1.5 - 4.5 g/dL   Albumin/Globulin Ratio 2.3 (H) 1.2 - 2.2   Bilirubin Total 0.6 0.0 - 1.2 mg/dL   Alkaline Phosphatase 65 39 - 117 IU/L   AST 16 0 - 40 IU/L   ALT 15 0 - 32 IU/L  Lipid Panel w/o Chol/HDL Ratio  Result Value Ref Range   Cholesterol, Total 132 100 - 199 mg/dL   Triglycerides 90 0 - 149 mg/dL   HDL 50  >39 mg/dL   VLDL Cholesterol Cal 18 5 - 40 mg/dL   LDL Calculated 64 0 - 99 mg/dL  TSH  Result Value Ref Range   TSH 1.630 0.450 - 4.500 uIU/mL  UA/M w/rflx Culture, Routine   Specimen: Urine   URINE  Result Value Ref Range   Specific Gravity, UA 1.015 1.005 - 1.030   pH, UA 7.0 5.0 - 7.5   Color, UA Yellow Yellow   Appearance Ur Clear Clear   Leukocytes,UA Negative Negative   Protein,UA Negative Negative/Trace   Glucose, UA Negative Negative   Ketones, UA Trace (A) Negative   RBC, UA Negative Negative   Bilirubin, UA Negative Negative   Urobilinogen, Ur 0.2 0.2 - 1.0 mg/dL   Nitrite, UA Negative Negative      Assessment & Plan:   Problem List Items Addressed This Visit      Other   Insomnia - Primary    Stable and under good control with prn ativan. Continue current regimen and good sleep hygiene      Attention deficit hyperactivity disorder    Stable and under good control, continue current regimen       Other Visit Diagnoses    Flu vaccine need       Relevant Orders   Flu Vaccine QUAD 36+ mos IM (Completed)       Follow up plan: Return in about 3 months (around 09/26/2019) for Sleep, adhd f/u.

## 2019-07-01 NOTE — Assessment & Plan Note (Signed)
Stable and under good control with prn ativan. Continue current regimen and good sleep hygiene

## 2019-07-01 NOTE — Assessment & Plan Note (Signed)
Stable and under good control, continue current regimen 

## 2019-07-03 ENCOUNTER — Telehealth: Payer: Self-pay

## 2019-07-03 NOTE — Telephone Encounter (Signed)
PA approved.

## 2019-07-03 NOTE — Telephone Encounter (Signed)
PA for Vyvanse initiated and submitted on Cover My Meds. Key: [id number]

## 2019-07-24 ENCOUNTER — Other Ambulatory Visit: Payer: Self-pay | Admitting: Family Medicine

## 2019-07-24 ENCOUNTER — Other Ambulatory Visit: Payer: Self-pay

## 2019-07-24 DIAGNOSIS — Z1231 Encounter for screening mammogram for malignant neoplasm of breast: Secondary | ICD-10-CM

## 2019-08-06 ENCOUNTER — Other Ambulatory Visit: Payer: Self-pay

## 2019-08-06 ENCOUNTER — Ambulatory Visit
Admission: RE | Admit: 2019-08-06 | Discharge: 2019-08-06 | Disposition: A | Discharge status: Home / Self Care | Payer: PRIVATE HEALTH INSURANCE | Source: Ambulatory Visit | Attending: Family Medicine | Admitting: Family Medicine

## 2019-08-06 DIAGNOSIS — Z1231 Encounter for screening mammogram for malignant neoplasm of breast: Secondary | ICD-10-CM | POA: Diagnosis present

## 2019-08-07 ENCOUNTER — Other Ambulatory Visit: Payer: Self-pay | Admitting: Family Medicine

## 2019-08-07 DIAGNOSIS — R921 Mammographic calcification found on diagnostic imaging of breast: Secondary | ICD-10-CM

## 2019-08-07 DIAGNOSIS — R928 Other abnormal and inconclusive findings on diagnostic imaging of breast: Secondary | ICD-10-CM

## 2019-08-20 ENCOUNTER — Ambulatory Visit
Admission: RE | Admit: 2019-08-20 | Discharge: 2019-08-20 | Disposition: A | Discharge status: Home / Self Care | Payer: PRIVATE HEALTH INSURANCE | Source: Ambulatory Visit | Attending: Family Medicine | Admitting: Family Medicine

## 2019-08-20 ENCOUNTER — Other Ambulatory Visit: Payer: Self-pay | Admitting: Family Medicine

## 2019-08-20 DIAGNOSIS — R928 Other abnormal and inconclusive findings on diagnostic imaging of breast: Secondary | ICD-10-CM | POA: Diagnosis present

## 2019-08-20 DIAGNOSIS — R921 Mammographic calcification found on diagnostic imaging of breast: Secondary | ICD-10-CM | POA: Diagnosis present

## 2019-08-21 ENCOUNTER — Other Ambulatory Visit: Payer: Self-pay

## 2019-08-22 NOTE — Telephone Encounter (Signed)
Should have an RX at the pharmacy to start 08/26/19.

## 2019-08-29 ENCOUNTER — Ambulatory Visit
Admission: RE | Admit: 2019-08-29 | Discharge: 2019-08-29 | Disposition: A | Discharge status: Home / Self Care | Payer: PRIVATE HEALTH INSURANCE | Source: Ambulatory Visit | Attending: Family Medicine | Admitting: Family Medicine

## 2019-08-29 DIAGNOSIS — R921 Mammographic calcification found on diagnostic imaging of breast: Secondary | ICD-10-CM

## 2019-08-29 DIAGNOSIS — R928 Other abnormal and inconclusive findings on diagnostic imaging of breast: Secondary | ICD-10-CM

## 2019-08-29 HISTORY — PX: BREAST BIOPSY: SHX20

## 2019-08-30 LAB — SURGICAL PATHOLOGY

## 2019-09-24 ENCOUNTER — Other Ambulatory Visit: Payer: Self-pay | Admitting: Family Medicine

## 2019-09-25 MED ORDER — LISDEXAMFETAMINE DIMESYLATE 50 MG PO CAPS: 50.0000 mg | ORAL_CAPSULE | Freq: Every day | ORAL | 0 refills | Status: DC

## 2019-09-25 NOTE — Telephone Encounter (Signed)
Patient scheduled for appointment on Monday- She is requesting call back to discuss potential partial refill until appointment.

## 2019-09-25 NOTE — Telephone Encounter (Signed)
Called pt to schedule appt, no answer, left vm 

## 2019-09-25 NOTE — Telephone Encounter (Signed)
Please advise 

## 2019-09-25 NOTE — Telephone Encounter (Signed)
Needs appointment

## 2019-09-25 NOTE — Telephone Encounter (Signed)
Called pt to let her know medication was sent, no answer, left vm

## 2019-09-30 ENCOUNTER — Encounter: Payer: Self-pay | Admitting: Family Medicine

## 2019-09-30 ENCOUNTER — Other Ambulatory Visit: Payer: Self-pay

## 2019-09-30 ENCOUNTER — Ambulatory Visit: Payer: PRIVATE HEALTH INSURANCE | Admitting: Family Medicine

## 2019-09-30 VITALS — BP 130/87 | HR 83 | Temp 98.8°F | Ht 65.0 in | Wt 205.0 lb

## 2019-09-30 DIAGNOSIS — G4700 Insomnia, unspecified: Secondary | ICD-10-CM

## 2019-09-30 DIAGNOSIS — F419 Anxiety disorder, unspecified: Secondary | ICD-10-CM

## 2019-09-30 DIAGNOSIS — F909 Attention-deficit hyperactivity disorder, unspecified type: Secondary | ICD-10-CM

## 2019-09-30 DIAGNOSIS — R030 Elevated blood-pressure reading, without diagnosis of hypertension: Secondary | ICD-10-CM | POA: Diagnosis not present

## 2019-09-30 MED ORDER — LISDEXAMFETAMINE DIMESYLATE 50 MG PO CAPS: 50.0000 mg | ORAL_CAPSULE | Freq: Every day | ORAL | 0 refills | Status: DC

## 2019-09-30 NOTE — Progress Notes (Signed)
BP 130/87   Pulse 83   Temp 98.8 F (37.1 C) (Oral)   Ht 5\' 5"  (1.651 m)   Wt 205 lb (93 kg)   SpO2 99%   BMI 34.11 kg/m    Subjective:    Patient ID: , female    DOB: 1976/01/20, 43 y.o.   MRN: [id number]  HPI: Tammy Mays is a 43 y.o. female  Chief Complaint  Patient presents with  . ADHD  . Insomnia   Patient presenting today for f/u chronic conditions.   HTN - has been up some lately due to significant increase in stress with new promotion to police chief in [place] and the current political issues at hand. Trying to keep things under control with breathing exercises and good diet/exercise habits.   Anxiety - Having to take the lorazepam just about every night right now since the promotion last month due to increased stress levels, but trying to cut back down as much as possible. Hoping once things are adjusted better she will be feeling better able to cope with her situation. Does not wish to add something daily at this time.   ADHD - Still doing very well on the vyvanse. Feels it keeps her focused and on task without causing any side effects. Denies CP, SOB, insomnia (due to this medicine) or appetite issues.   Depression screen [facility] 2/9 09/30/2019 06/26/2019 03/21/2019  Decreased Interest 0 0 0  Down, Depressed, Hopeless 0 0 0  PHQ - 2 Score 0 0 0  Altered sleeping 0 1 0  Tired, decreased energy 0 0 0  Change in appetite 0 0 0  Feeling bad or failure about yourself  0 0 0  Trouble concentrating 0 0 0  Moving slowly or fidgety/restless 0 0 0  Suicidal thoughts 0 0 0  PHQ-9 Score 0 1 0  Difficult doing work/chores - - Not difficult at all   GAD 7 : Generalized Anxiety Score 09/30/2019 12/13/2018  Nervous, Anxious, on Edge 0 2  Control/stop worrying 0 1  Worry too much - different things 0 2  Trouble relaxing 1 2  Restless 0 2  Easily annoyed or irritable 0 0  Afraid - awful might happen 0 0  Total GAD 7 Score 1 9  Anxiety Difficulty Not difficult at  all -   Relevant past medical, surgical, family and social history reviewed and updated as indicated. Interim medical history since our last visit reviewed. Allergies and medications reviewed and updated.  Review of Systems  Per HPI unless specifically indicated above     Objective:    BP 130/87   Pulse 83   Temp 98.8 F (37.1 C) (Oral)   Ht 5\' 5"  (1.651 m)   Wt 205 lb (93 kg)   SpO2 99%   BMI 34.11 kg/m   Wt Readings from Last 3 Encounters:  09/30/19 205 lb (93 kg)  06/26/19 212 lb (96.2 kg)  12/13/18 253 lb (114.8 kg)    Physical Exam Vitals signs and nursing note reviewed.  Constitutional:      Appearance: Normal appearance. She is not ill-appearing.  HENT:     Head: Atraumatic.  Eyes:     Extraocular Movements: Extraocular movements intact.     Conjunctiva/sclera: Conjunctivae normal.  Neck:     Musculoskeletal: Normal range of motion and neck supple.  Cardiovascular:     Rate and Rhythm: Normal rate and regular rhythm.     Heart sounds: Normal heart  sounds.  Pulmonary:     Effort: Pulmonary effort is normal.     Breath sounds: Normal breath sounds.  Musculoskeletal: Normal range of motion.  Skin:    General: Skin is warm and dry.  Neurological:     Mental Status: She is alert and oriented to person, place, and time.  Psychiatric:        Mood and Affect: Mood normal.        Thought Content: Thought content normal.        Judgment: Judgment normal.     Results for orders placed or performed during the hospital encounter of 08/29/19  Surgical pathology  Result Value Ref Range   SURGICAL PATHOLOGY      SURGICAL PATHOLOGY CASE: [id number] PATIENT: [facility] Surgical Pathology Report     Specimen Submitted: A. Breast, right  Clinical History: Small group of round and amorphous calcs, indeterminate could be benign FCC, r/o DCIS. Coil shaped biopsy clip deployed 1.5 cm medial to the site of biopsy.      DIAGNOSIS: A.  BREAST, RIGHT, LOWER  OUTER QUADRANT; STEREOTACTIC BIOPSIES: - FOCAL FIBROCYSTIC CHANGES WITH DUCTAL MICROCALCIFICATIONS, ASSOCIATED APOCRINE METAPLASIA AND COLUMNAR CELL CHANGE WITHOUT ATYPIA. - FOCUS OF FIBROADENOMATOID CHANGE. - MULTIPLE FRAGMENTS OF BENIGN FIBROFATTY BREAST PARENCHYMA. - NEGATIVE FOR ATYPIA AND MALIGNANCY.   GROSS DESCRIPTION: A. Labeled: Right breast anterior core biopsy at 8 o'clock position-calcs Received: in a formalin-filled CoreTainer transport system Accompanying specimen radiograph: Yes Time / date in fixative: Tissue procedure time 8:18 AM, tissue put in formalin time 8:21 AM 08/29/2019 Cold ischemic time: 3  minutes Total fixation time: 9 hours Core pieces: Multiple Measurement: 0.5 to 1.1 cm in length and 0.1 cm in width Description / comments: Fibrofatty tissue cores Inked: Black Entirely submitted. Block summary: 1- section D 2- section E 3- section G 4-7-entirely submitted breast cords  Final Diagnosis performed by Raynelle Bring, MD.   Electronically signed 08/30/2019 8:30:08AM The electronic signature indicates that the named Attending Pathologist has evaluated the specimen Technical component performed at [facility], [address], [place], [place] [address] Lab: [phone] Dir: Rush Farmer, MD, MMM  Professional component performed at [facility], [facility], [place], [place], [place] [address] Lab: [phone] Dir: Dellia Nims. Rubinas, MD       Assessment & Plan:   Problem List Items Addressed This Visit      Cardiovascular and Mediastinum   Elevated blood pressure, situational    Declines adding on medicine, will continue to work on stress control and good lifestyle choices. Continue to monitor closely, call with persistent abnormal readings        Other   Insomnia - Primary    Having worsened insomnia with the increased stress, continue the ativan prn and will trial a different medication for sleep if continuing to have to  take it at this frequency. Will try to cut back down to one script lasting several months once things settle back down.       Attention deficit hyperactivity disorder    Stable and under good control, continue current regimen      Anxiety    Exacerbated by recent promotion and work stressors, continue prn ativan, declines daily preventative at this time but will consider in the future if things don't settle down soon. Continue current regimen with close monitoring          Follow up plan: Return in about 3 months (around 12/29/2019) for 6 month f/u.

## 2019-09-30 NOTE — Assessment & Plan Note (Signed)
Stable and under good control, continue current regimen 

## 2019-09-30 NOTE — Assessment & Plan Note (Signed)
Declines adding on medicine, will continue to work on stress control and good lifestyle choices. Continue to monitor closely, call with persistent abnormal readings

## 2019-09-30 NOTE — Assessment & Plan Note (Signed)
Exacerbated by recent promotion and work stressors, continue prn ativan, declines daily preventative at this time but will consider in the future if things don't settle down soon. Continue current regimen with close monitoring

## 2019-09-30 NOTE — Assessment & Plan Note (Signed)
Having worsened insomnia with the increased stress, continue the ativan prn and will trial a different medication for sleep if continuing to have to take it at this frequency. Will try to cut back down to one script lasting several months once things settle back down.

## 2019-10-03 ENCOUNTER — Encounter: Payer: Self-pay | Admitting: Family Medicine

## 2019-10-24 ENCOUNTER — Other Ambulatory Visit: Payer: Self-pay

## 2019-10-24 MED ORDER — LORAZEPAM 1 MG PO TABS: 1.0000 mg | ORAL_TABLET | Freq: Every day | ORAL | 2 refills | Status: DC | PRN

## 2019-10-24 NOTE — Telephone Encounter (Signed)
Last appt 09/30/2019

## 2019-10-29 ENCOUNTER — Other Ambulatory Visit: Payer: Self-pay | Admitting: Family Medicine

## 2019-10-29 NOTE — Telephone Encounter (Signed)
Prescription sent to drop 10/25/19

## 2019-10-29 NOTE — Telephone Encounter (Signed)
Prescription set to drop on 10/25/19.

## 2019-10-30 ENCOUNTER — Other Ambulatory Visit: Payer: Self-pay | Admitting: Family Medicine

## 2019-10-30 NOTE — Telephone Encounter (Signed)
Please refuse. I sent a mychart message to the patient letting her know she should have had a prescription drop on 10/25/19.

## 2019-12-25 ENCOUNTER — Encounter: Payer: Self-pay | Admitting: Family Medicine

## 2019-12-25 ENCOUNTER — Other Ambulatory Visit: Payer: Self-pay | Admitting: Family Medicine

## 2019-12-27 ENCOUNTER — Other Ambulatory Visit: Payer: Self-pay | Admitting: Family Medicine

## 2020-01-28 ENCOUNTER — Other Ambulatory Visit: Payer: Self-pay

## 2020-01-28 ENCOUNTER — Ambulatory Visit (INDEPENDENT_AMBULATORY_CARE_PROVIDER_SITE_OTHER): Payer: PRIVATE HEALTH INSURANCE | Admitting: Obstetrics and Gynecology

## 2020-01-28 ENCOUNTER — Other Ambulatory Visit (HOSPITAL_COMMUNITY)
Admission: RE | Admit: 2020-01-28 | Discharge: 2020-01-28 | Disposition: A | Discharge status: Home / Self Care | Payer: PRIVATE HEALTH INSURANCE | Source: Ambulatory Visit | Attending: Obstetrics and Gynecology | Admitting: Obstetrics and Gynecology

## 2020-01-28 VITALS — BP 132/84 | Ht 65.0 in | Wt 205.0 lb

## 2020-01-28 DIAGNOSIS — Z975 Presence of (intrauterine) contraceptive device: Secondary | ICD-10-CM | POA: Diagnosis not present

## 2020-01-28 DIAGNOSIS — Z124 Encounter for screening for malignant neoplasm of cervix: Secondary | ICD-10-CM

## 2020-01-28 DIAGNOSIS — Z1331 Encounter for screening for depression: Secondary | ICD-10-CM

## 2020-01-28 DIAGNOSIS — Z01419 Encounter for gynecological examination (general) (routine) without abnormal findings: Secondary | ICD-10-CM | POA: Diagnosis present

## 2020-01-28 DIAGNOSIS — Z1339 Encounter for screening examination for other mental health and behavioral disorders: Secondary | ICD-10-CM

## 2020-01-28 NOTE — Progress Notes (Signed)
Gynecology Annual Exam  PCP: Volney American, PA-C  Chief Complaint  Patient presents with  . Annual Exam   History of Present Illness:  Ms. Tammy Mays is a 44 y.o. G1P1001 who LMP was Patient's last menstrual period was 01/21/2020., presents today for her annual examination.  Her menses are light with her IUD.  She does not have vasomotor sx.   She is sexually active. She uses an IUD for contraception (placed in 2015).  She does not have vaginal dryness.  Last Pap: 10/29/2018  Results were: no abnormalities - HPV DNA negative History of LEEP in 2017. Normal pap smears in 2018 and 2019.  Hx of STDs: none  Last mammogram: 08/06/2019  Results were: ultimately normal, required biopsy.  Routine screening recommended as follow up.  There is no FH of breast cancer. There is no FH of ovarian cancer. The patient does do self-breast exams.  Colonoscopy: n/a DEXA: has not been screened for osteoporosis  Tobacco use: The patient denies current or previous tobacco use. Alcohol use: social drinker Exercise: moderately active  The patient wears seatbelts: yes.     Past Medical History:  Diagnosis Date  . Abnormal Pap smear of cervix   . Anxiety   . Attention deficit hyperactivity disorder   . Depression   . Insomnia     Past Surgical History:  Procedure Laterality Date  . BREAST BIOPSY Right 08/29/2019   path pending, calcs  . CERVICAL BIOPSY  W/ LOOP ELECTRODE EXCISION    . LEEP    . TONSILLECTOMY      Prior to Admission medications   Medication Sig Start Date End Date Taking? Authorizing Provider  azelastine (ASTELIN) 0.1 % nasal spray U 1 TO 2 SPRAYS IEN BID 05/23/19  Yes Volney American, PA-C  cetirizine (ZYRTEC) 10 MG tablet TAKE 1 TABLET(10 MG) BY MOUTH DAILY 12/25/19  Yes Volney American, PA-C  fluticasone [facility]) 50 MCG/ACT nasal spray SHAKE LQ AND U 2 SPRAYS IEN QD 05/24/19  Yes Volney American, PA-C  ibuprofen (ADVIL,MOTRIN) 800 MG tablet  Take 1 tablet (800 mg total) by mouth 3 (three) times daily. 07/04/18  Yes Crissman, Jeannette How, MD  lisdexamfetamine (VYVANSE) 50 MG capsule Take 1 capsule (50 mg total) by mouth daily. 11/25/19  Yes Volney American, PA-C  LORazepam (ATIVAN) 1 MG tablet Take 1 tablet (1 mg total) by mouth daily as needed for anxiety. 10/24/19  Yes Volney American, PA-C    Allergies  Allergen Reactions  . Effexor [Venlafaxine]    Obstetric History: G1P1001  Family History  Problem Relation Age of Onset  . Hepatitis C Mother   . Breast cancer Neg Hx   . Cancer Neg Hx   . Diabetes Neg Hx   . Heart disease Neg Hx   . Hypertension Neg Hx   . Stroke Neg Hx   . COPD Neg Hx     Social History   Socioeconomic History  . Marital status: Divorced    Spouse name: Not on file  . Number of children: Not on file  . Years of education: Not on file  . Highest education level: Not on file  Occupational History  . Not on file  Tobacco Use  . Smoking status: Former Smoker    Types: Cigarettes    Quit date: 06/29/1999    Years since quitting: 20.6  . Smokeless tobacco: Never Used  Substance and Sexual Activity  . Alcohol use: Yes  Alcohol/week: 0.0 standard drinks    Comment: occasional  . Drug use: No  . Sexual activity: Not on file  Other Topics Concern  . Not on file  Social History Narrative  . Not on file   Social Determinants of Health   Financial Resource Strain:   . Difficulty of Paying Living Expenses:   Food Insecurity:   . Worried About Programme researcher, broadcasting/film/video in the Last Year:   . Barista in the Last Year:   Transportation Needs:   . Freight forwarder (Medical):   Marland Kitchen Lack of Transportation (Non-Medical):   Physical Activity:   . Days of Exercise per Week:   . Minutes of Exercise per Session:   Stress:   . Feeling of Stress :   Social Connections:   . Frequency of Communication with Friends and Family:   . Frequency of Social Gatherings with Friends and  Family:   . Attends Religious Services:   . Active Member of Clubs or Organizations:   . Attends Banker Meetings:   Marland Kitchen Marital Status:   Intimate Partner Violence:   . Fear of Current or Ex-Partner:   . Emotionally Abused:   Marland Kitchen Physically Abused:   . Sexually Abused:     Review of Systems  Constitutional: Negative.   HENT: Negative.   Eyes: Negative.   Respiratory: Negative.   Cardiovascular: Negative.   Gastrointestinal: Negative.   Genitourinary: Negative.   Musculoskeletal: Negative.   Skin: Negative.   Neurological: Negative.   Psychiatric/Behavioral: Negative.      Physical Exam BP 132/84   Ht 5\' 5"  (1.651 m)   Wt 205 lb (93 kg)   LMP 01/21/2020   BMI 34.11 kg/m   Physical Exam Constitutional:      General: She is not in acute distress.    Appearance: Normal appearance. She is well-developed.  Genitourinary:     Pelvic exam was performed with patient in the lithotomy position.     Vulva, inguinal canal, urethra, bladder, vagina, uterus, right adnexa and left adnexa normal.     No posterior fourchette tenderness, injury or lesion present.     No cervical friability, lesion, bleeding or polyp.     No IUD strings visualized.  HENT:     Head: Normocephalic and atraumatic.  Eyes:     General: No scleral icterus.    Conjunctiva/sclera: Conjunctivae normal.  Cardiovascular:     Rate and Rhythm: Normal rate and regular rhythm.     Heart sounds: No murmur. No friction rub. No gallop.   Pulmonary:     Effort: Pulmonary effort is normal. No respiratory distress.     Breath sounds: Normal breath sounds. No wheezing or rales.  Abdominal:     General: Bowel sounds are normal. There is no distension.     Palpations: Abdomen is soft. There is no mass.     Tenderness: There is no abdominal tenderness. There is no guarding or rebound.  Musculoskeletal:        General: Normal range of motion.     Cervical back: Normal range of motion and neck supple.   Neurological:     General: No focal deficit present.     Mental Status: She is alert and oriented to person, place, and time.     Cranial Nerves: No cranial nerve deficit.  Skin:    General: Skin is warm and dry.     Findings: No erythema.  Psychiatric:  Mood and Affect: Mood normal.        Behavior: Behavior normal.        Judgment: Judgment normal.     Female chaperone present for pelvic and breast  portions of the physical exam  Results: AUDIT Questionnaire (screen for alcoholism): 2 PHQ-9: 0  Assessment: 44 y.o. No obstetric history on file. female here for routine gynecologic examination.  Plan: Problem List Items Addressed This Visit    None    Visit Diagnoses    Women's annual routine gynecological examination    -  Primary   Relevant Orders   Cytology - PAP   Screening for depression       Screening for alcoholism       Pap smear for cervical cancer screening       Relevant Orders   Cytology - PAP      Screening: -- Blood pressure screen normal -- Colonoscopy - not due -- Mammogram - not due -- Weight screening: normal -- Depression screening negative (PHQ-9) -- Nutrition: normal -- cholesterol screening: not due for screening -- osteoporosis screening: not due -- tobacco screening: not using -- alcohol screening: AUDIT questionnaire indicates low-risk usage. -- family history of breast cancer screening: done. not at high risk. -- no evidence of domestic violence or intimate partner violence. -- STD screening: gonorrhea/chlamydia NAAT not collected per patient request. -- pap smear collected per ASCCP guidelines  Thomasene Mohair, MD 01/28/2020 2:58 PM

## 2020-01-29 ENCOUNTER — Encounter: Payer: Self-pay | Admitting: Obstetrics and Gynecology

## 2020-01-30 LAB — CYTOLOGY - PAP
Comment: NEGATIVE
Diagnosis: NEGATIVE
High risk HPV: NEGATIVE

## 2020-02-01 ENCOUNTER — Other Ambulatory Visit: Payer: Self-pay | Admitting: Family Medicine

## 2020-02-01 ENCOUNTER — Encounter: Payer: Self-pay | Admitting: Family Medicine

## 2020-02-03 ENCOUNTER — Telehealth: Payer: Self-pay | Admitting: Family Medicine

## 2020-02-03 MED ORDER — LISDEXAMFETAMINE DIMESYLATE 50 MG PO CAPS: 50.0000 mg | ORAL_CAPSULE | Freq: Every day | ORAL | 0 refills | Status: DC

## 2020-02-03 NOTE — Telephone Encounter (Signed)
Patient last seen 09/30/19

## 2020-02-03 NOTE — Telephone Encounter (Signed)
Called pt back to let her know that she was suppose to come back for a f/u 12/29/19. Pt would like a refill on her vyvanse, pt scheduled for f/u 02/06/20. Please advise.

## 2020-02-06 ENCOUNTER — Encounter: Payer: Self-pay | Admitting: Family Medicine

## 2020-02-06 ENCOUNTER — Telehealth (INDEPENDENT_AMBULATORY_CARE_PROVIDER_SITE_OTHER): Payer: PRIVATE HEALTH INSURANCE | Admitting: Family Medicine

## 2020-02-06 VITALS — Wt 205.0 lb

## 2020-02-06 DIAGNOSIS — F419 Anxiety disorder, unspecified: Secondary | ICD-10-CM | POA: Diagnosis not present

## 2020-02-06 DIAGNOSIS — G4700 Insomnia, unspecified: Secondary | ICD-10-CM | POA: Diagnosis not present

## 2020-02-06 DIAGNOSIS — F909 Attention-deficit hyperactivity disorder, unspecified type: Secondary | ICD-10-CM | POA: Diagnosis not present

## 2020-02-06 MED ORDER — LORAZEPAM 1 MG PO TABS: 1.0000 mg | ORAL_TABLET | Freq: Every day | ORAL | 2 refills | Status: DC | PRN

## 2020-02-06 NOTE — Progress Notes (Signed)
Wt 205 lb (93 kg)   LMP 01/21/2020   BMI 34.11 kg/m    Subjective:    Patient ID: Tammy Mays, female    DOB: 09/27/1976, 44 y.o.   MRN: [id number]  HPI: Tammy Mays is a 44 y.o. female  Chief Complaint  Patient presents with  . Insomnia  . Anxiety    . This visit was completed via MyChart due to the restrictions of the COVID-19 pandemic. All issues as above were discussed and addressed. Physical exam was done as above through visual confirmation on MyChart. If it was felt that the patient should be evaluated in the office, they were directed there. The patient verbally consented to this visit. . Location of the patient: work . Location of the provider: home . Those involved with this call:  . Provider: Merrie Roof, PA-C . CMA: Lesle Chris, [place] . Front Desk/Registration: Jill Side  . Time spent on call: 25 minutes with patient face to face via video conference. More than 50% of this time was spent in counseling and coordination of care. 5 minutes total spent in review of patient's record and preparation of their chart. I verified patient identity using two factors (patient name and date of birth). Patient consents verbally to being seen via telemedicine visit today.   Presenting today for 3 month f/u anxiety and ADHD.   No new concerns, states things are going well with her medications. Vyvanse continues to benefit her focus, organization, task completion. Denies side effects, including palpitations, CP, appetite issues. Taking lorazepam prn for anxiety and insomnia. Has tried numerous other agents for sleep with poor effect, typically grogginess and pt is on call often for her job. This regimen works well for her and has for years.   Depression screen [facility] 2/9 02/06/2020 09/30/2019 06/26/2019  Decreased Interest 0 0 0  Down, Depressed, Hopeless 0 0 0  PHQ - 2 Score 0 0 0  Altered sleeping 1 0 1  Tired, decreased energy 0 0 0  Change in appetite 0 0 0  Feeling bad or failure  about yourself  0 0 0  Trouble concentrating 0 0 0  Moving slowly or fidgety/restless 0 0 0  Suicidal thoughts 0 0 0  PHQ-9 Score 1 0 1  Difficult doing work/chores - - -   GAD 7 : Generalized Anxiety Score 02/06/2020 09/30/2019 12/13/2018  Nervous, Anxious, on Edge 0 0 2  Control/stop worrying 0 0 1  Worry too much - different things 1 0 2  Trouble relaxing 1 1 2   Restless 0 0 2  Easily annoyed or irritable 0 0 0  Afraid - awful might happen 0 0 0  Total GAD 7 Score 2 1 9   Anxiety Difficulty - Not difficult at all -   Relevant past medical, surgical, family and social history reviewed and updated as indicated. Interim medical history since our last visit reviewed. Allergies and medications reviewed and updated.  Review of Systems  Per HPI unless specifically indicated above     Objective:    Wt 205 lb (93 kg)   LMP 01/21/2020   BMI 34.11 kg/m   Wt Readings from Last 3 Encounters:  02/06/20 205 lb (93 kg)  01/28/20 205 lb (93 kg)  09/30/19 205 lb (93 kg)    Physical Exam Vitals and nursing note reviewed.  Constitutional:      General: She is not in acute distress.    Appearance: Normal appearance.  HENT:  Head: Atraumatic.     Right Ear: External ear normal.     Left Ear: External ear normal.     Nose: Nose normal. No congestion.     Mouth/Throat:     Mouth: Mucous membranes are moist.     Pharynx: Oropharynx is clear. No posterior oropharyngeal erythema.  Eyes:     Extraocular Movements: Extraocular movements intact.     Conjunctiva/sclera: Conjunctivae normal.  Cardiovascular:     Comments: Unable to assess via virtual visit Pulmonary:     Effort: Pulmonary effort is normal. No respiratory distress.  Musculoskeletal:        General: Normal range of motion.     Cervical back: Normal range of motion.  Skin:    General: Skin is dry.     Findings: No erythema.  Neurological:     Mental Status: She is alert and oriented to person, place, and time.   Psychiatric:        Mood and Affect: Mood normal.        Thought Content: Thought content normal.        Judgment: Judgment normal.     Results for orders placed or performed in visit on 01/28/20  Cytology - PAP  Result Value Ref Range   High risk HPV Negative    Adequacy      Satisfactory for evaluation; transformation zone component PRESENT.   Diagnosis      - Negative for intraepithelial lesion or malignancy (NILM)   Comment Normal Reference Range HPV - Negative       Assessment & Plan:   Problem List Items Addressed This Visit      Other   Insomnia - Primary    Stable and under good control, continue current regimen      Attention deficit hyperactivity disorder    Stable and well controlled, continue current regimen      Anxiety    Stable, well controlled. Continue current regimen      Relevant Medications   LORazepam (ATIVAN) 1 MG tablet       Follow up plan: Return in about 3 months (around 05/07/2020) for 6 month f/u.

## 2020-02-10 NOTE — Assessment & Plan Note (Signed)
Stable and well controlled, continue current regimen 

## 2020-02-10 NOTE — Assessment & Plan Note (Signed)
Stable, well controlled. Continue current regimen 

## 2020-02-10 NOTE — Assessment & Plan Note (Signed)
Stable and under good control, continue current regimen 

## 2020-03-04 ENCOUNTER — Other Ambulatory Visit: Payer: Self-pay | Admitting: Family Medicine

## 2020-03-04 MED ORDER — LISDEXAMFETAMINE DIMESYLATE 50 MG PO CAPS: 50.0000 mg | ORAL_CAPSULE | Freq: Every day | ORAL | 0 refills | Status: DC

## 2020-03-04 NOTE — Telephone Encounter (Signed)
LOV: 02/06/20; NOV: 04/02/20  Last filled 02/03/20 for 30 day supply.

## 2020-04-02 ENCOUNTER — Other Ambulatory Visit: Payer: Self-pay

## 2020-04-02 ENCOUNTER — Ambulatory Visit (INDEPENDENT_AMBULATORY_CARE_PROVIDER_SITE_OTHER): Payer: PRIVATE HEALTH INSURANCE | Admitting: Family Medicine

## 2020-04-02 ENCOUNTER — Encounter: Payer: Self-pay | Admitting: Family Medicine

## 2020-04-02 VITALS — BP 134/91 | HR 80 | Temp 98.1°F | Ht 65.0 in | Wt 195.0 lb

## 2020-04-02 DIAGNOSIS — Z0000 Encounter for general adult medical examination without abnormal findings: Secondary | ICD-10-CM | POA: Diagnosis not present

## 2020-04-02 DIAGNOSIS — R030 Elevated blood-pressure reading, without diagnosis of hypertension: Secondary | ICD-10-CM | POA: Diagnosis not present

## 2020-04-02 DIAGNOSIS — F909 Attention-deficit hyperactivity disorder, unspecified type: Secondary | ICD-10-CM

## 2020-04-02 DIAGNOSIS — Z136 Encounter for screening for cardiovascular disorders: Secondary | ICD-10-CM

## 2020-04-02 DIAGNOSIS — J3089 Other allergic rhinitis: Secondary | ICD-10-CM | POA: Diagnosis not present

## 2020-04-02 DIAGNOSIS — F419 Anxiety disorder, unspecified: Secondary | ICD-10-CM

## 2020-04-02 LAB — UA/M W/RFLX CULTURE, ROUTINE
Bilirubin, UA: NEGATIVE
Glucose, UA: NEGATIVE
Ketones, UA: NEGATIVE
Leukocytes,UA: NEGATIVE
Nitrite, UA: NEGATIVE
Protein,UA: NEGATIVE
RBC, UA: NEGATIVE
Specific Gravity, UA: 1.015 (ref 1.005–1.030)
Urobilinogen, Ur: 0.2 mg/dL (ref 0.2–1.0)
pH, UA: 7 (ref 5.0–7.5)

## 2020-04-02 MED ORDER — LISDEXAMFETAMINE DIMESYLATE 50 MG PO CAPS: 50.0000 mg | ORAL_CAPSULE | Freq: Every day | ORAL | 0 refills | Status: DC

## 2020-04-02 MED ORDER — MONTELUKAST SODIUM 10 MG PO TABS: 10.0000 mg | ORAL_TABLET | Freq: Every day | ORAL | 11 refills | Status: DC

## 2020-04-02 NOTE — Assessment & Plan Note (Signed)
Stable and well controlled, continue current regimen 

## 2020-04-02 NOTE — Assessment & Plan Note (Signed)
Overall WNL, sometimes a bit above normal limits. Continue monitoring and working on weight, stress relief.

## 2020-04-02 NOTE — Assessment & Plan Note (Signed)
Stable and well controlled, continue vyvanse regimen

## 2020-04-02 NOTE — Progress Notes (Signed)
BP (!) 134/91   Pulse 80   Temp 98.1 F (36.7 C) (Oral)   Ht 5\' 5"  (1.651 m)   Wt 195 lb (88.5 kg)   SpO2 97%   BMI 32.45 kg/m    Subjective:    Patient ID: , female    DOB: [birth year], 44 y.o.   MRN: [id number]  HPI: Tammy Mays is a 44 y.o. female presenting on 04/02/2020 for comprehensive medical examination. Current medical complaints include:see below  Checking BPs at work, notes high in the mornings but settles back to 130s/80s in the afternoon. She feels hectic mornings, coffee and vyvanse may be running her pressures up a bit but working on stress relief and weight. Denies CP, SOB, HAs, dizziness.   Takes ativan nightly for sleep and anxiety, has tried many other agents in the past without relief and is often on call and other agents make her too groggy. This works well for her and is well tolerated.   Allergies - under good control on current regimen of zyrtec, singulair and nasal sprays. No recent exacerbations.   ADHD - vyvanse continues to work well for her, keeps her focused and organized and does not have side effects. Denies palpitations, sleep or appetite concerns.   She currently lives with: Menopausal Symptoms: no  Depression Screen done today and results listed below:  Depression screen [facility] 2/9 04/02/2020 02/06/2020 09/30/2019 06/26/2019 03/21/2019  Decreased Interest 0 0 0 0 0  Down, Depressed, Hopeless 0 0 0 0 0  PHQ - 2 Score 0 0 0 0 0  Altered sleeping 2 1 0 1 0  Tired, decreased energy 0 0 0 0 0  Change in appetite 0 0 0 0 0  Feeling bad or failure about yourself  0 0 0 0 0  Trouble concentrating 0 0 0 0 0  Moving slowly or fidgety/restless 0 0 0 0 0  Suicidal thoughts 0 0 0 0 0  PHQ-9 Score 2 1 0 1 0  Difficult doing work/chores - - - - Not difficult at all    The patient does not have a history of falls. I did complete a risk assessment for falls. A plan of care for falls was documented.   Past Medical History:  Past Medical History:    Diagnosis Date  . Abnormal Pap smear of cervix   . Anxiety   . Attention deficit hyperactivity disorder   . Depression   . Insomnia     Surgical History:  Past Surgical History:  Procedure Laterality Date  . BREAST BIOPSY Right 08/29/2019   path pending, calcs  . CERVICAL BIOPSY  W/ LOOP ELECTRODE EXCISION    . LEEP    . TONSILLECTOMY      Medications:  Current Outpatient Medications on File Prior to Visit  Medication Sig  . azelastine (ASTELIN) 0.1 % nasal spray U 1 TO 2 SPRAYS IEN BID  . cetirizine (ZYRTEC) 10 MG tablet TAKE 1 TABLET(10 MG) BY MOUTH DAILY  . fluticasone (FLONASE) 50 MCG/ACT nasal spray SHAKE LQ AND U 2 SPRAYS IEN QD  . ibuprofen (ADVIL,MOTRIN) 800 MG tablet Take 1 tablet (800 mg total) by mouth 3 (three) times daily.  08/31/2019 LORazepam (ATIVAN) 1 MG tablet Take 1 tablet (1 mg total) by mouth daily as needed for anxiety.   No current facility-administered medications on file prior to visit.    Allergies:  Allergies  Allergen Reactions  . Effexor [Venlafaxine]     Social  History:  Social History   Socioeconomic History  . Marital status: Divorced    Spouse name: Not on file  . Number of children: Not on file  . Years of education: Not on file  . Highest education level: Not on file  Occupational History  . Not on file  Tobacco Use  . Smoking status: Former Smoker    Types: Cigarettes    Quit date: 06/29/1999    Years since quitting: 20.7  . Smokeless tobacco: Never Used  Substance and Sexual Activity  . Alcohol use: Yes    Alcohol/week: 0.0 standard drinks    Comment: occasional  . Drug use: No  . Sexual activity: Not on file  Other Topics Concern  . Not on file  Social History Narrative  . Not on file   Social Determinants of Health   Financial Resource Strain:   . Difficulty of Paying Living Expenses:   Food Insecurity:   . Worried About Charity fundraiser in the Last Year:   . Arboriculturist in the Last Year:   Transportation  Needs:   . Film/video editor (Medical):   Marland Kitchen Lack of Transportation (Non-Medical):   Physical Activity:   . Days of Exercise per Week:   . Minutes of Exercise per Session:   Stress:   . Feeling of Stress :   Social Connections:   . Frequency of Communication with Friends and Family:   . Frequency of Social Gatherings with Friends and Family:   . Attends Religious Services:   . Active Member of Clubs or Organizations:   . Attends Archivist Meetings:   Marland Kitchen Marital Status:   Intimate Partner Violence:   . Fear of Current or Ex-Partner:   . Emotionally Abused:   Marland Kitchen Physically Abused:   . Sexually Abused:    Social History   Tobacco Use  Smoking Status Former Smoker  . Types: Cigarettes  . Quit date: 06/29/1999  . Years since quitting: 20.7  Smokeless Tobacco Never Used   Social History   Substance and Sexual Activity  Alcohol Use Yes  . Alcohol/week: 0.0 standard drinks   Comment: occasional    Family History:  Family History  Problem Relation Age of Onset  . Hepatitis C Mother   . Breast cancer Neg Hx   . Cancer Neg Hx   . Diabetes Neg Hx   . Heart disease Neg Hx   . Hypertension Neg Hx   . Stroke Neg Hx   . COPD Neg Hx     Past medical history, surgical history, medications, allergies, family history and social history reviewed with patient today and changes made to appropriate areas of the chart.   Review of Systems - General ROS: negative Psychological ROS: negative Ophthalmic ROS: negative ENT ROS: negative Allergy and Immunology ROS: negative Hematological and Lymphatic ROS: negative Endocrine ROS: negative Breast ROS: negative for breast lumps Respiratory ROS: no cough, shortness of breath, or wheezing Cardiovascular ROS: no chest pain or dyspnea on exertion Gastrointestinal ROS: no abdominal pain, change in bowel habits, or black or bloody stools Genito-Urinary ROS: no dysuria, trouble voiding, or hematuria Musculoskeletal ROS:  negative Neurological ROS: no TIA or stroke symptoms Dermatological ROS: negative All other ROS negative except what is listed above and in the HPI.      Objective:    BP (!) 134/91   Pulse 80   Temp 98.1 F (36.7 C) (Oral)   Ht 5\' 5"  (1.651 m)  Wt 195 lb (88.5 kg)   SpO2 97%   BMI 32.45 kg/m   Wt Readings from Last 3 Encounters:  04/02/20 195 lb (88.5 kg)  02/06/20 205 lb (93 kg)  01/28/20 205 lb (93 kg)    Physical Exam Vitals and nursing note reviewed.  Constitutional:      General: She is not in acute distress.    Appearance: She is well-developed.  HENT:     Head: Atraumatic.     Right Ear: External ear normal.     Left Ear: External ear normal.     Nose: Nose normal.     Mouth/Throat:     Pharynx: No oropharyngeal exudate.  Eyes:     General: No scleral icterus.    Conjunctiva/sclera: Conjunctivae normal.     Pupils: Pupils are equal, round, and reactive to light.  Neck:     Thyroid: No thyromegaly.  Cardiovascular:     Rate and Rhythm: Normal rate and regular rhythm.     Heart sounds: Normal heart sounds.  Pulmonary:     Effort: Pulmonary effort is normal. No respiratory distress.     Breath sounds: Normal breath sounds.  Chest:     Comments: Done through GYN Abdominal:     General: Bowel sounds are normal.     Palpations: Abdomen is soft. There is no mass.     Tenderness: There is no abdominal tenderness.  Genitourinary:    Comments: Done through GYN Musculoskeletal:        General: No tenderness. Normal range of motion.     Cervical back: Normal range of motion and neck supple.  Lymphadenopathy:     Cervical: No cervical adenopathy.  Skin:    General: Skin is warm and dry.     Findings: No rash.  Neurological:     Mental Status: She is alert and oriented to person, place, and time.     Cranial Nerves: No cranial nerve deficit.  Psychiatric:        Behavior: Behavior normal.    Results for orders placed or performed in visit on 04/02/20   UA/M w/rflx Culture, Routine   Specimen: Urine   URINE  Result Value Ref Range   Specific Gravity, UA 1.015 1.005 - 1.030   pH, UA 7.0 5.0 - 7.5   Color, UA Yellow Yellow   Appearance Ur Clear Clear   Leukocytes,UA Negative Negative   Protein,UA Negative Negative/Trace   Glucose, UA Negative Negative   Ketones, UA Negative Negative   RBC, UA Negative Negative   Bilirubin, UA Negative Negative   Urobilinogen, Ur 0.2 0.2 - 1.0 mg/dL   Nitrite, UA Negative Negative      Assessment & Plan:   Problem List Items Addressed This Visit      Cardiovascular and Mediastinum   Elevated blood pressure, situational    Overall WNL, sometimes a bit above normal limits. Continue monitoring and working on weight, stress relief.       Relevant Orders   CBC with Differential/Platelet   Comprehensive metabolic panel   TSH   UA/M w/rflx Culture, Routine (Completed)     Respiratory   Allergic rhinitis    Stable and well controlled, continue current regimen        Other   Attention deficit hyperactivity disorder    Stable and well controlled, continue vyvanse regimen      Anxiety - Primary    Stable and well controlled, continue current regimen with close monitoring  Other Visit Diagnoses    Annual physical exam       Screening for cardiovascular condition       Relevant Orders   Lipid Panel w/o Chol/HDL Ratio       Follow up plan: Return in about 3 months (around 07/03/2020) for ADHD, anxiety f/u.   LABORATORY TESTING:  - Pap smear: up to date  IMMUNIZATIONS:   - Tdap: Tetanus vaccination status reviewed: last tetanus booster within 10 years. - Influenza: Postponed to flu season  SCREENING: -Mammogram: Up to date   PATIENT COUNSELING:   Advised to take 1 mg of folate supplement per day if capable of pregnancy.   Sexuality: Discussed sexually transmitted diseases, partner selection, use of condoms, avoidance of unintended pregnancy  and contraceptive  alternatives.   Advised to avoid cigarette smoking.  I discussed with the patient that most people either abstain from alcohol or drink within safe limits (<=14/week and <=4 drinks/occasion for males, <=7/weeks and <= 3 drinks/occasion for females) and that the risk for alcohol disorders and other health effects rises proportionally with the number of drinks per week and how often a drinker exceeds daily limits.  Discussed cessation/primary prevention of drug use and availability of treatment for abuse.   Diet: Encouraged to adjust caloric intake to maintain  or achieve ideal body weight, to reduce intake of dietary saturated fat and total fat, to limit sodium intake by avoiding high sodium foods and not adding table salt, and to maintain adequate dietary potassium and calcium preferably from fresh fruits, vegetables, and low-fat dairy products.    stressed the importance of regular exercise  Injury prevention: Discussed safety belts, safety helmets, smoke detector, smoking near bedding or upholstery.   Dental health: Discussed importance of regular tooth brushing, flossing, and dental visits.    NEXT PREVENTATIVE PHYSICAL DUE IN 1 YEAR. Return in about 3 months (around 07/03/2020) for ADHD, anxiety f/u.

## 2020-04-02 NOTE — Assessment & Plan Note (Signed)
Stable and well controlled, continue current regimen with close monitoring 

## 2020-04-03 LAB — CBC WITH DIFFERENTIAL/PLATELET
Basophils Absolute: 0 10*3/uL (ref 0.0–0.2)
Basos: 1 %
EOS (ABSOLUTE): 0.1 10*3/uL (ref 0.0–0.4)
Eos: 2 %
Hematocrit: 44.5 % (ref 34.0–46.6)
Hemoglobin: 15 g/dL (ref 11.1–15.9)
Immature Grans (Abs): 0 10*3/uL (ref 0.0–0.1)
Immature Granulocytes: 0 %
Lymphocytes Absolute: 2 10*3/uL (ref 0.7–3.1)
Lymphs: 32 %
MCH: 30.7 pg (ref 26.6–33.0)
MCHC: 33.7 g/dL (ref 31.5–35.7)
MCV: 91 fL (ref 79–97)
Monocytes Absolute: 0.7 10*3/uL (ref 0.1–0.9)
Monocytes: 12 %
Neutrophils Absolute: 3.3 10*3/uL (ref 1.4–7.0)
Neutrophils: 53 %
Platelets: 250 10*3/uL (ref 150–450)
RBC: 4.89 x10E6/uL (ref 3.77–5.28)
RDW: 12.7 % (ref 11.7–15.4)
WBC: 6.1 10*3/uL (ref 3.4–10.8)

## 2020-04-03 LAB — COMPREHENSIVE METABOLIC PANEL
ALT: 12 IU/L (ref 0–32)
AST: 16 IU/L (ref 0–40)
Albumin/Globulin Ratio: 2 (ref 1.2–2.2)
Albumin: 4.6 g/dL (ref 3.8–4.8)
Alkaline Phosphatase: 63 IU/L (ref 48–121)
BUN/Creatinine Ratio: 18 (ref 9–23)
BUN: 14 mg/dL (ref 6–24)
Bilirubin Total: 0.4 mg/dL (ref 0.0–1.2)
CO2: 21 mmol/L (ref 20–29)
Calcium: 9.3 mg/dL (ref 8.7–10.2)
Chloride: 97 mmol/L (ref 96–106)
Creatinine, Ser: 0.79 mg/dL (ref 0.57–1.00)
GFR calc Af Amer: 104 mL/min/{1.73_m2} (ref >59)
GFR calc non Af Amer: 90 mL/min/{1.73_m2} (ref >59)
Globulin, Total: 2.3 g/dL (ref 1.5–4.5)
Glucose: 63 mg/dL — ABNORMAL LOW (ref 65–99)
Potassium: 4.1 mmol/L (ref 3.5–5.2)
Sodium: 140 mmol/L (ref 134–144)
Total Protein: 6.9 g/dL (ref 6.0–8.5)

## 2020-04-03 LAB — LIPID PANEL W/O CHOL/HDL RATIO
Cholesterol, Total: 159 mg/dL (ref 100–199)
HDL: 60 mg/dL (ref >39)
LDL Chol Calc (NIH): 83 mg/dL (ref 0–99)
Triglycerides: 84 mg/dL (ref 0–149)
VLDL Cholesterol Cal: 16 mg/dL (ref 5–40)

## 2020-04-03 LAB — TSH: TSH: 1.97 u[IU]/mL (ref 0.450–4.500)

## 2020-04-26 ENCOUNTER — Other Ambulatory Visit: Payer: Self-pay | Admitting: Family Medicine

## 2020-04-26 ENCOUNTER — Encounter: Payer: Self-pay | Admitting: Family Medicine

## 2020-04-27 ENCOUNTER — Other Ambulatory Visit: Payer: Self-pay | Admitting: Family Medicine

## 2020-04-27 MED ORDER — LISDEXAMFETAMINE DIMESYLATE 50 MG PO CAPS: 50.0000 mg | ORAL_CAPSULE | Freq: Every day | ORAL | 0 refills | Status: DC

## 2020-04-27 MED ORDER — CETIRIZINE HCL 10 MG PO TABS: 10.0000 mg | ORAL_TABLET | Freq: Two times a day (BID) | ORAL | 3 refills | Status: DC

## 2020-04-27 NOTE — Telephone Encounter (Signed)
Last seen 04/02/20

## 2020-05-15 ENCOUNTER — Encounter: Payer: Self-pay | Admitting: Family Medicine

## 2020-05-15 ENCOUNTER — Telehealth (INDEPENDENT_AMBULATORY_CARE_PROVIDER_SITE_OTHER): Payer: PRIVATE HEALTH INSURANCE | Admitting: Family Medicine

## 2020-05-15 DIAGNOSIS — F909 Attention-deficit hyperactivity disorder, unspecified type: Secondary | ICD-10-CM

## 2020-05-15 DIAGNOSIS — J3089 Other allergic rhinitis: Secondary | ICD-10-CM | POA: Diagnosis not present

## 2020-05-15 DIAGNOSIS — G4700 Insomnia, unspecified: Secondary | ICD-10-CM | POA: Diagnosis not present

## 2020-05-15 DIAGNOSIS — M722 Plantar fascial fibromatosis: Secondary | ICD-10-CM

## 2020-05-15 DIAGNOSIS — F419 Anxiety disorder, unspecified: Secondary | ICD-10-CM

## 2020-05-15 MED ORDER — AZELASTINE HCL 0.1 % NA SOLN: NASAL | 12 refills | Status: DC

## 2020-05-15 MED ORDER — LORAZEPAM 1 MG PO TABS: 1.0000 mg | ORAL_TABLET | Freq: Every day | ORAL | 2 refills | Status: DC | PRN

## 2020-05-15 MED ORDER — FLUTICASONE PROPIONATE 50 MCG/ACT NA SUSP: NASAL | 12 refills | Status: DC

## 2020-05-15 MED ORDER — IBUPROFEN 800 MG PO TABS: 800.0000 mg | ORAL_TABLET | Freq: Three times a day (TID) | ORAL | 2 refills | Status: DC

## 2020-05-15 NOTE — Progress Notes (Signed)
There were no vitals taken for this visit.   Subjective:    Patient ID: Tammy Mays, female    DOB: 1975-11-20, 44 y.o.   MRN: [id number]  HPI: Tammy Mays is a 44 y.o. female  Chief Complaint  Patient presents with  . ADHD  . Anxiety    . This visit was completed via MyChart due to the restrictions of the COVID-19 pandemic. All issues as above were discussed and addressed. Physical exam was done as above through visual confirmation on MyChart. If it was felt that the patient should be evaluated in the office, they were directed there. The patient verbally consented to this visit. . Location of the patient: home . Location of the provider: work . Those involved with this call:  . Provider: Roosvelt Maser, PA-C . CMA: Elton Sin, CMA . Front Desk/Registration: Harriet Pho  . Time spent on call: 25 minutes with patient face to face via video conference. More than 50% of this time was spent in counseling and coordination of care. 5 minutes total spent in review of patient's record and preparation of their chart. I verified patient identity using two factors (patient name and date of birth). Patient consents verbally to being seen via telemedicine visit today.   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 Previously had good relief with ibuprofen and would like a refill. Stretching, icing feet, trying to wear supportive shoes.   Allergies under good control on current regimen, no recent exacerbations since adding astelin nasal spray and increasing flonase to BID.   Insomnia, anxiety still well controlled on ativan daily as needed. Has been controlled on this for years without escalation of dose or frequency. Denies side effects. Very stressful job but otherwise doing well.   ADHD - continues to do well with vyvanse. This keeps her mind focused and helps complete tasks. Denies CP, SOB, palpitations, dizziness, loss of  appetite.   Depression screen [facility] 2/9 05/15/2020 04/02/2020 02/06/2020  Decreased Interest 0 0 0  Down, Depressed, Hopeless 0 0 0  PHQ - 2 Score 0 0 0  Altered sleeping 2 2 1   Tired, decreased energy 0 0 0  Change in appetite 0 0 0  Feeling bad or failure about yourself  0 0 0  Trouble concentrating 0 0 0  Moving slowly or fidgety/restless 0 0 0  Suicidal thoughts 0 0 0  PHQ-9 Score 2 2 1   Difficult doing work/chores - - -  Some recent data might be hidden   GAD 7 : Generalized Anxiety Score 05/15/2020 04/02/2020 02/06/2020 09/30/2019  Nervous, Anxious, on Edge 1 1 0 0  Control/stop worrying 0 0 0 0  Worry too much - different things 0 0 1 0  Trouble relaxing 1 1 1 1   Restless 1 1 0 0  Easily annoyed or irritable 0 0 0 0  Afraid - awful might happen 0 0 0 0  Total GAD 7 Score 3 3 2 1   Anxiety Difficulty - Not difficult at all - Not difficult at all   Relevant past medical, surgical, family and social history reviewed and updated as indicated. Interim medical history since our last visit reviewed. Allergies and medications reviewed and updated.  Review of Systems  Per HPI unless specifically indicated above     Objective:    There were no vitals taken for this visit.  Wt Readings from Last 3 Encounters:  04/02/20 195  lb (88.5 kg)  02/06/20 205 lb (93 kg)  01/28/20 205 lb (93 kg)    Physical Exam Vitals and nursing note reviewed.  Constitutional:      General: She is not in acute distress.    Appearance: Normal appearance.  HENT:     Head: Atraumatic.     Right Ear: External ear normal.     Left Ear: External ear normal.     Nose: Nose normal. No congestion.     Mouth/Throat:     Mouth: Mucous membranes are moist.     Pharynx: Oropharynx is clear. No posterior oropharyngeal erythema.  Eyes:     Extraocular Movements: Extraocular movements intact.     Conjunctiva/sclera: Conjunctivae normal.  Cardiovascular:     Comments: Unable to assess via virtual visit Pulmonary:       Effort: Pulmonary effort is normal. No respiratory distress.  Musculoskeletal:        General: Normal range of motion.     Cervical back: Normal range of motion.  Skin:    General: Skin is dry.     Findings: No erythema.  Neurological:     Mental Status: She is alert and oriented to person, place, and time.  Psychiatric:        Mood and Affect: Mood normal.        Thought Content: Thought content normal.        Judgment: Judgment normal.     Results for orders placed or performed in visit on 04/02/20  CBC with Differential/Platelet  Result Value Ref Range   WBC 6.1 3.4 - 10.8 x10E3/uL   RBC 4.89 3.77 - 5.28 x10E6/uL   Hemoglobin 15.0 11.1 - 15.9 g/dL   Hematocrit 67.3 41.9 - 46.6 %   MCV 91 79 - 97 fL   MCH 30.7 26.6 - 33.0 pg   MCHC 33.7 31 - 35 g/dL   RDW 37.9 02.4 - 09.7 %   Platelets 250 150 - 450 x10E3/uL   Neutrophils 53 Not Estab. %   Lymphs 32 Not Estab. %   Monocytes 12 Not Estab. %   Eos 2 Not Estab. %   Basos 1 Not Estab. %   Neutrophils Absolute 3.3 1 - 7 x10E3/uL   Lymphocytes Absolute 2.0 0 - 3 x10E3/uL   Monocytes Absolute 0.7 0 - 0 x10E3/uL   EOS (ABSOLUTE) 0.1 0.0 - 0.4 x10E3/uL   Basophils Absolute 0.0 0 - 0 x10E3/uL   Immature Granulocytes 0 Not Estab. %   Immature Grans (Abs) 0.0 0.0 - 0.1 x10E3/uL  Comprehensive metabolic panel  Result Value Ref Range   Glucose 63 (L) 65 - 99 mg/dL   BUN 14 6 - 24 mg/dL   Creatinine, Ser 3.53 0.57 - 1.00 mg/dL   GFR calc non Af Amer 90 >59 mL/min/1.73   GFR calc Af Amer 104 >59 mL/min/1.73   BUN/Creatinine Ratio 18 9 - 23   Sodium 140 134 - 144 mmol/L   Potassium 4.1 3.5 - 5.2 mmol/L   Chloride 97 96 - 106 mmol/L   CO2 21 20 - 29 mmol/L   Calcium 9.3 8.7 - 10.2 mg/dL   Total Protein 6.9 6.0 - 8.5 g/dL   Albumin 4.6 3.8 - 4.8 g/dL   Globulin, Total 2.3 1.5 - 4.5 g/dL   Albumin/Globulin Ratio 2.0 1.2 - 2.2   Bilirubin Total 0.4 0.0 - 1.2 mg/dL   Alkaline Phosphatase 63 48 - 121 IU/L   AST 16 0 - 40  IU/L   ALT 12 0 - 32 IU/L  Lipid Panel w/o Chol/HDL Ratio  Result Value Ref Range   Cholesterol, Total 159 100 - 199 mg/dL   Triglycerides 84 0 - 149 mg/dL   HDL 60 >84 mg/dL   VLDL Cholesterol Cal 16 5 - 40 mg/dL   LDL Chol Calc (NIH) 83 0 - 99 mg/dL  TSH  Result Value Ref Range   TSH 1.970 0.450 - 4.500 uIU/mL  UA/M w/rflx Culture, Routine   Specimen: Urine   URINE  Result Value Ref Range   Specific Gravity, UA 1.015 1.005 - 1.030   pH, UA 7.0 5.0 - 7.5   Color, UA Yellow Yellow   Appearance Ur Clear Clear   Leukocytes,UA Negative Negative   Protein,UA Negative Negative/Trace   Glucose, UA Negative Negative   Ketones, UA Negative Negative   RBC, UA Negative Negative   Bilirubin, UA Negative Negative   Urobilinogen, Ur 0.2 0.2 - 1.0 mg/dL   Nitrite, UA Negative Negative      Assessment & Plan:   Problem List Items Addressed This Visit      Respiratory   Allergic rhinitis - Primary    Chronic stable and well controlled, continue antihistamine, singulair and nasal sprays        Musculoskeletal and Integument   Plantar fasciitis, bilateral    Discussed massage, soaks, ibuprofen prn. F/u if not resolving        Other   Insomnia    Stable and well controlled with prn ativan and good sleep hygiene, continue current regimen with close monitoring      Attention deficit hyperactivity disorder    Chronic, stable and well controlled on vyvanse, continue current regimen      Anxiety    Stable on prn ativan, continue current regimen with close monitoring      Relevant Medications   LORazepam (ATIVAN) 1 MG tablet       Follow up plan: Return in about 3 months (around 08/15/2020) for ADHD.

## 2020-05-19 NOTE — Assessment & Plan Note (Signed)
Stable and well controlled with prn ativan and good sleep hygiene, continue current regimen with close monitoring

## 2020-05-19 NOTE — Assessment & Plan Note (Signed)
Chronic stable and well controlled, continue antihistamine, singulair and nasal sprays

## 2020-05-19 NOTE — Assessment & Plan Note (Signed)
Stable on prn ativan, continue current regimen with close monitoring

## 2020-05-19 NOTE — Assessment & Plan Note (Signed)
Chronic, stable and well controlled on vyvanse, continue current regimen

## 2020-05-19 NOTE — Assessment & Plan Note (Signed)
Discussed massage, soaks, ibuprofen prn. F/u if not resolving

## 2020-05-26 ENCOUNTER — Other Ambulatory Visit: Payer: Self-pay | Admitting: Family Medicine

## 2020-05-26 ENCOUNTER — Encounter: Payer: Self-pay | Admitting: Family Medicine

## 2020-05-26 MED ORDER — LISDEXAMFETAMINE DIMESYLATE 50 MG PO CAPS: 50.0000 mg | ORAL_CAPSULE | Freq: Every day | ORAL | 0 refills | Status: DC

## 2020-05-26 NOTE — Telephone Encounter (Signed)
LOV 05/15/2020

## 2020-05-26 NOTE — Telephone Encounter (Signed)
Patient last seen 05/15/20 and has appointment 08/17/20.

## 2020-05-31 ENCOUNTER — Other Ambulatory Visit: Payer: Self-pay | Admitting: Family Medicine

## 2020-06-26 ENCOUNTER — Other Ambulatory Visit: Payer: Self-pay | Admitting: Family Medicine

## 2020-06-26 MED ORDER — LISDEXAMFETAMINE DIMESYLATE 50 MG PO CAPS: 50.0000 mg | ORAL_CAPSULE | Freq: Every day | ORAL | 0 refills | Status: DC

## 2020-06-26 NOTE — Telephone Encounter (Signed)
Routing to provider  

## 2020-07-09 ENCOUNTER — Telehealth: Payer: Self-pay

## 2020-07-09 NOTE — Telephone Encounter (Signed)
PA for Vyvanse initiated and submitted via Cover My Meds. Key:  [id number]

## 2020-07-09 NOTE — Telephone Encounter (Signed)
PA approved. Notified patient via Mychart.

## 2020-07-27 MED ORDER — LISDEXAMFETAMINE DIMESYLATE 50 MG PO CAPS: 50.0000 mg | ORAL_CAPSULE | Freq: Every day | ORAL | 0 refills | Status: DC

## 2020-07-27 NOTE — Telephone Encounter (Signed)
Vyvanse refilled, not sure if you need this for her husband.

## 2020-08-17 ENCOUNTER — Telehealth: Payer: PRIVATE HEALTH INSURANCE | Admitting: Family Medicine

## 2020-08-17 ENCOUNTER — Telehealth: Payer: Self-pay | Admitting: Obstetrics & Gynecology

## 2020-08-17 ENCOUNTER — Telehealth (INDEPENDENT_AMBULATORY_CARE_PROVIDER_SITE_OTHER): Payer: PRIVATE HEALTH INSURANCE | Admitting: Family Medicine

## 2020-08-17 ENCOUNTER — Encounter: Payer: Self-pay | Admitting: Family Medicine

## 2020-08-17 DIAGNOSIS — F909 Attention-deficit hyperactivity disorder, unspecified type: Secondary | ICD-10-CM

## 2020-08-17 DIAGNOSIS — Z1231 Encounter for screening mammogram for malignant neoplasm of breast: Secondary | ICD-10-CM | POA: Diagnosis not present

## 2020-08-17 DIAGNOSIS — Z30433 Encounter for removal and reinsertion of intrauterine contraceptive device: Secondary | ICD-10-CM | POA: Diagnosis not present

## 2020-08-17 DIAGNOSIS — F419 Anxiety disorder, unspecified: Secondary | ICD-10-CM | POA: Diagnosis not present

## 2020-08-17 DIAGNOSIS — Z23 Encounter for immunization: Secondary | ICD-10-CM

## 2020-08-17 MED ORDER — LISDEXAMFETAMINE DIMESYLATE 50 MG PO CAPS: 50.0000 mg | ORAL_CAPSULE | Freq: Every day | ORAL | 0 refills | Status: DC

## 2020-08-17 MED ORDER — LORAZEPAM 1 MG PO TABS: 1.0000 mg | ORAL_TABLET | Freq: Every day | ORAL | 2 refills | Status: DC | PRN

## 2020-08-17 NOTE — Telephone Encounter (Signed)
[facility] referring for Dr. Jean Rosenthal, Need IUD removal and replacement. Called and left voicemail for patient to call back to be scheduled.

## 2020-08-17 NOTE — Progress Notes (Signed)
There were no vitals taken for this visit.   Subjective:    Patient ID: Tammy Mays, female    DOB: Sep 14, 1976, 44 y.o.   MRN: [id number]  HPI: Tammy Mays is a 44 y.o. female  Chief Complaint  Patient presents with  . ADHD    3 month f/up   ADHD FOLLOW UP ADHD status: controlled Satisfied with current therapy: yes Medication compliance:  excellent compliance Controlled substance contract: yes Previous psychiatry evaluation: no Previous medications: yes    Taking meds on weekends/vacations: yes Work/school performance:  excellent Difficulty sustaining attention/completing tasks: no Distracted by extraneous stimuli: no Does not listen when spoken to: no  Fidgets with hands or feet: no Unable to stay in seat: no Blurts out/interrupts others: no ADHD Medication Side Effects: no    Decreased appetite: no    Headache: no    Sleeping disturbance pattern: no    Irritability: no    Rebound effects (worse than baseline) off medication: no    Anxiousness: no    Dizziness: no    Tics: no  Needs a new IUD and a mammogram.   ANXIETY/STRESS Duration:stable Anxious mood: yes  Excessive worrying: no Irritability: no  Sweating: no Nausea: no Palpitations:no Hyperventilation: no Panic attacks: no Agoraphobia: no  Obscessions/compulsions: no Depressed mood: no Depression screen [facility] 2/9 05/15/2020 04/02/2020 02/06/2020 09/30/2019 06/26/2019  Decreased Interest 0 0 0 0 0  Down, Depressed, Hopeless 0 0 0 0 0  PHQ - 2 Score 0 0 0 0 0  Altered sleeping 2 2 1  0 1  Tired, decreased energy 0 0 0 0 0  Change in appetite 0 0 0 0 0  Feeling bad or failure about yourself  0 0 0 0 0  Trouble concentrating 0 0 0 0 0  Moving slowly or fidgety/restless 0 0 0 0 0  Suicidal thoughts 0 0 0 0 0  PHQ-9 Score 2 2 1  0 1  Difficult doing work/chores - - - - -  Some recent data might be hidden   Anhedonia: no Weight changes: no Insomnia: no   Hypersomnia: no Fatigue/loss of energy:  no Feelings of worthlessness: no Feelings of guilt: no Impaired concentration/indecisiveness: no Suicidal ideations: no  Crying spells: no Recent Stressors/Life Changes: no   Relationship problems: no   Family stress: no     Financial stress: no    Job stress: yes    Recent death/loss: no   Relevant past medical, surgical, family and social history reviewed and updated as indicated. Interim medical history since our last visit reviewed. Allergies and medications reviewed and updated.  Review of Systems  Constitutional: Negative.   Respiratory: Negative.   Cardiovascular: Negative.   Gastrointestinal: Negative.   Musculoskeletal: Negative.   Psychiatric/Behavioral: Negative.     Per HPI unless specifically indicated above     Objective:    There were no vitals taken for this visit.  Wt Readings from Last 3 Encounters:  04/02/20 195 lb (88.5 kg)  02/06/20 205 lb (93 kg)  01/28/20 205 lb (93 kg)    Physical Exam Vitals and nursing note reviewed.  Constitutional:      General: She is not in acute distress.    Appearance: Normal appearance. She is not ill-appearing, toxic-appearing or diaphoretic.  HENT:     Head: Normocephalic and atraumatic.     Right Ear: External ear normal.     Left Ear: External ear normal.     Nose: Nose normal.  Mouth/Throat:     Mouth: Mucous membranes are moist.     Pharynx: Oropharynx is clear.  Eyes:     General: No scleral icterus.       Right eye: No discharge.        Left eye: No discharge.     Conjunctiva/sclera: Conjunctivae normal.     Pupils: Pupils are equal, round, and reactive to light.  Pulmonary:     Effort: Pulmonary effort is normal. No respiratory distress.     Comments: Speaking in full sentences Musculoskeletal:        General: Normal range of motion.     Cervical back: Normal range of motion.  Skin:    Coloration: Skin is not jaundiced or pale.     Findings: No bruising, erythema, lesion or rash.   Neurological:     Mental Status: She is alert and oriented to person, place, and time. Mental status is at baseline.  Psychiatric:        Mood and Affect: Mood normal.        Behavior: Behavior normal.        Thought Content: Thought content normal.        Judgment: Judgment normal.     Results for orders placed or performed in visit on 04/02/20  CBC with Differential/Platelet  Result Value Ref Range   WBC 6.1 3.4 - 10.8 x10E3/uL   RBC 4.89 3.77 - 5.28 x10E6/uL   Hemoglobin 15.0 11.1 - 15.9 g/dL   Hematocrit 17.4 71.5 - 46.6 %   MCV 91 79 - 97 fL   MCH 30.7 26.6 - 33.0 pg   MCHC 33.7 31 - 35 g/dL   RDW 95.3 96.7 - 28.9 %   Platelets 250 150 - 450 x10E3/uL   Neutrophils 53 Not Estab. %   Lymphs 32 Not Estab. %   Monocytes 12 Not Estab. %   Eos 2 Not Estab. %   Basos 1 Not Estab. %   Neutrophils Absolute 3.3 1 - 7 x10E3/uL   Lymphocytes Absolute 2.0 0 - 3 x10E3/uL   Monocytes Absolute 0.7 0 - 0 x10E3/uL   EOS (ABSOLUTE) 0.1 0.0 - 0.4 x10E3/uL   Basophils Absolute 0.0 0 - 0 x10E3/uL   Immature Granulocytes 0 Not Estab. %   Immature Grans (Abs) 0.0 0.0 - 0.1 x10E3/uL  Comprehensive metabolic panel  Result Value Ref Range   Glucose 63 (L) 65 - 99 mg/dL   BUN 14 6 - 24 mg/dL   Creatinine, Ser 7.91 0.57 - 1.00 mg/dL   GFR calc non Af Amer 90 >59 mL/min/1.73   GFR calc Af Amer 104 >59 mL/min/1.73   BUN/Creatinine Ratio 18 9 - 23   Sodium 140 134 - 144 mmol/L   Potassium 4.1 3.5 - 5.2 mmol/L   Chloride 97 96 - 106 mmol/L   CO2 21 20 - 29 mmol/L   Calcium 9.3 8.7 - 10.2 mg/dL   Total Protein 6.9 6.0 - 8.5 g/dL   Albumin 4.6 3.8 - 4.8 g/dL   Globulin, Total 2.3 1.5 - 4.5 g/dL   Albumin/Globulin Ratio 2.0 1.2 - 2.2   Bilirubin Total 0.4 0.0 - 1.2 mg/dL   Alkaline Phosphatase 63 48 - 121 IU/L   AST 16 0 - 40 IU/L   ALT 12 0 - 32 IU/L  Lipid Panel w/o Chol/HDL Ratio  Result Value Ref Range   Cholesterol, Total 159 100 - 199 mg/dL   Triglycerides 84 0 - 149 mg/dL  HDL  60 >39 mg/dL   VLDL Cholesterol Cal 16 5 - 40 mg/dL   LDL Chol Calc (NIH) 83 0 - 99 mg/dL  TSH  Result Value Ref Range   TSH 1.970 0.450 - 4.500 uIU/mL  UA/M w/rflx Culture, Routine   Specimen: Urine   URINE  Result Value Ref Range   Specific Gravity, UA 1.015 1.005 - 1.030   pH, UA 7.0 5.0 - 7.5   Color, UA Yellow Yellow   Appearance Ur Clear Clear   Leukocytes,UA Negative Negative   Protein,UA Negative Negative/Trace   Glucose, UA Negative Negative   Ketones, UA Negative Negative   RBC, UA Negative Negative   Bilirubin, UA Negative Negative   Urobilinogen, Ur 0.2 0.2 - 1.0 mg/dL   Nitrite, UA Negative Negative      Assessment & Plan:   Problem List Items Addressed This Visit      Other   Attention deficit hyperactivity disorder - Primary    Under good control on current regimen. Continue current regimen. Continue to monitor. Call with any concerns. Refills given for 3 months. Follow up in 3 months.        Anxiety    Under good control on current regimen. Continue current regimen. Continue to monitor. Call with any concerns. Refills given for 3 months. Follow up in 3 months.        Relevant Medications   LORazepam (ATIVAN) 1 MG tablet    Other Visit Diagnoses    Encounter for screening mammogram for malignant neoplasm of breast       Mammogram ordered today.    Relevant Orders   MM 3D SCREEN BREAST BILATERAL   Encounter for removal and reinsertion of intrauterine contraceptive device (IUD)       Referral to GYN placed today.   Relevant Orders   Ambulatory referral to Obstetrics / Gynecology   Needs flu shot       Order placed- will come in.   Relevant Orders   Flu Vaccine QUAD 6+ mos PF IM (Fluarix Quad PF)       Follow up plan: Return in about 3 months (around 11/17/2020).   . This visit was completed via MyChart due to the restrictions of the COVID-19 pandemic. All issues as above were discussed and addressed. Physical exam was done as above through  visual confirmation on MyChart. If it was felt that the patient should be evaluated in the office, they were directed there. The patient verbally consented to this visit. . Location of the patient: parking lot . Location of the provider: work . Those involved with this call:  . Provider: Olevia Perches, DO . CMA: Wilhemena Durie, CMA . Front Desk/Registration: Harriet Pho  . Time spent on call: 25 minutes with patient face to face via video conference. More than 50% of this time was spent in counseling and coordination of care. 40 minutes total spent in review of patient's record and preparation of their chart.

## 2020-08-17 NOTE — Assessment & Plan Note (Signed)
Under good control on current regimen. Continue current regimen. Continue to monitor. Call with any concerns. Refills given for 3 months. Follow up in 3 months.   

## 2020-08-18 NOTE — Telephone Encounter (Signed)
Called and left voicemail for patient to call back to be scheduled. 

## 2020-08-20 NOTE — Telephone Encounter (Signed)
Called and left voicemail for patient to call back to be scheduled. 

## 2020-08-24 NOTE — Telephone Encounter (Signed)
Patient is scheduled for Mirena replacement on 09/08/20 with SDJ

## 2020-08-25 NOTE — Telephone Encounter (Signed)
Noted. Will order to arrive by apt date/time. 

## 2020-09-08 ENCOUNTER — Other Ambulatory Visit: Payer: Self-pay

## 2020-09-08 ENCOUNTER — Encounter (INDEPENDENT_AMBULATORY_CARE_PROVIDER_SITE_OTHER): Payer: PRIVATE HEALTH INSURANCE | Admitting: Obstetrics and Gynecology

## 2020-09-08 ENCOUNTER — Encounter: Payer: Self-pay | Admitting: Obstetrics and Gynecology

## 2020-09-08 NOTE — Progress Notes (Signed)
No charge appt. No services provided

## 2020-09-26 ENCOUNTER — Other Ambulatory Visit: Payer: Self-pay | Admitting: Family Medicine

## 2020-09-26 ENCOUNTER — Encounter: Payer: Self-pay | Admitting: Family Medicine

## 2020-10-28 ENCOUNTER — Other Ambulatory Visit: Payer: Self-pay | Admitting: Family Medicine

## 2020-10-28 ENCOUNTER — Encounter: Payer: Self-pay | Admitting: Family Medicine

## 2020-10-28 NOTE — Telephone Encounter (Signed)
RX should be at the pharmacy, patient aware of this. Please refuse.

## 2020-11-04 ENCOUNTER — Encounter: Payer: Self-pay | Admitting: Family Medicine

## 2020-11-04 ENCOUNTER — Other Ambulatory Visit: Payer: Self-pay | Admitting: Family Medicine

## 2020-11-04 ENCOUNTER — Ambulatory Visit (INDEPENDENT_AMBULATORY_CARE_PROVIDER_SITE_OTHER): Payer: PRIVATE HEALTH INSURANCE | Admitting: Family Medicine

## 2020-11-04 ENCOUNTER — Other Ambulatory Visit: Payer: Self-pay

## 2020-11-04 VITALS — BP 138/93 | HR 88 | Temp 98.5°F

## 2020-11-04 DIAGNOSIS — J3089 Other allergic rhinitis: Secondary | ICD-10-CM

## 2020-11-04 MED ORDER — PREDNISONE 50 MG PO TABS: ORAL_TABLET | ORAL | 0 refills | Status: DC

## 2020-11-04 NOTE — Progress Notes (Signed)
    SUBJECTIVE:   CHIEF COMPLAINT / HPI:   Patient Active Problem List   Diagnosis Date Noted  . Anxiety 03/02/2018  . Allergic rhinitis 02/28/2018  . Plantar fasciitis, bilateral 01/26/2016  . Elevated blood pressure, situational 09/30/2015  . Attention deficit hyperactivity disorder 06/29/2015  . Depression   . Insomnia    Allergies - takes singulair prn, flonase, astelin. Hasn't taken singulair recently. - typical triggers are environmental allergies, worse with Christmas tree last year. Required steroids at that time. - developed symptoms after bringing Christmas tree into house, symptoms persistent since taking down.  - 3 weeks duration - Symptoms: runny nose, postnasal drip, cough, watery eyes, ear pressure and fullness. - denies fevers, sick contacts, shortness of breath, chest pain, ear discharge - previously evaluated by ENT.   OBJECTIVE:   BP (!) 138/93   Pulse 88   Temp 98.5 F (36.9 C)   SpO2 100%   Gen: well appearing, in NAD HEENT: watery eyes, middle ear effusions present b/l with surrounding erythema. No inner ear purulence. Canals clear. Oropharynx clear with erythema present.  Card: RRR Lungs: CTAB, no wheeze/rales/rhonchi  ASSESSMENT/PLAN:   Allergic rhinitis With recent exacerbation with known trigger. Moderate symptoms not responsive to nasal CCS or antihistamine. Will provide steroid burst, recommend continuation of singulair. F/u if no better.   Caro Laroche, DO

## 2020-11-04 NOTE — Patient Instructions (Signed)
It was great to see you!  Our plans for today:  - Take the steroids as prescribed.  - You can take an over the counter decongestant along with this, like sudafed.  - If you develop fevers, trouble breathing, let us know.  Take care and seek immediate care sooner if you develop any concerns.   Dr. Linwood Dibbles

## 2020-11-04 NOTE — Assessment & Plan Note (Signed)
With recent exacerbation with known trigger. Moderate symptoms not responsive to nasal CCS or antihistamine. Will provide steroid burst, recommend continuation of singulair. F/u if no better.

## 2020-11-09 ENCOUNTER — Encounter: Payer: Self-pay | Admitting: Family Medicine

## 2020-11-10 ENCOUNTER — Encounter: Payer: Self-pay | Admitting: Family Medicine

## 2020-11-10 ENCOUNTER — Other Ambulatory Visit: Payer: Self-pay | Admitting: Family Medicine

## 2020-11-10 MED ORDER — AMOXICILLIN-POT CLAVULANATE 875-125 MG PO TABS: 1.0000 | ORAL_TABLET | Freq: Two times a day (BID) | ORAL | 0 refills | Status: AC

## 2020-11-12 ENCOUNTER — Other Ambulatory Visit: Payer: Self-pay

## 2020-11-12 ENCOUNTER — Ambulatory Visit (INDEPENDENT_AMBULATORY_CARE_PROVIDER_SITE_OTHER): Payer: PRIVATE HEALTH INSURANCE | Admitting: Family Medicine

## 2020-11-12 ENCOUNTER — Encounter: Payer: Self-pay | Admitting: Family Medicine

## 2020-11-12 DIAGNOSIS — J309 Allergic rhinitis, unspecified: Secondary | ICD-10-CM

## 2020-11-12 MED ORDER — METHYLPREDNISOLONE 4 MG PO TBPK: ORAL_TABLET | ORAL | 0 refills | Status: DC

## 2020-11-12 NOTE — Patient Instructions (Signed)
It was great to see you!  Our plans for today:  - Keep taking the antibiotics until they are finished. - Take the steroids as prescribed.  - Come back if no better after finishing these.  Take care and seek immediate care sooner if you develop any concerns.   Dr. Linwood Dibbles

## 2020-11-12 NOTE — Progress Notes (Signed)
   SUBJECTIVE:   CHIEF COMPLAINT / HPI:   Patient Active Problem List   Diagnosis Date Noted  . Anxiety 03/02/2018  . Allergic sinusitis 02/28/2018  . Plantar fasciitis, bilateral 01/26/2016  . Elevated blood pressure, situational 09/30/2015  . Attention deficit hyperactivity disorder 06/29/2015  . Depression   . Insomnia    EAR PAIN - recently seen for allergic sinusitis 11/04/20, subsequently developed fevers and increase in nasal discharge with ear pain. Rx augmentin 1/11 x5 days.  S/p prednisone course, finished Saturday. - was originally slightly improved but then exacerbated [place] and exposed to [place] - then developed fevers, increase in nasal drainage. Some improvement with abx.  - Still with facial pain, headache, sinus pressure, nasal discharge, b/l ear pain - no otorrhea, hearing loss   OBJECTIVE:   BP 124/87   Pulse 88   Temp (!) 87 F (30.6 C)   Wt 205 lb 12.8 oz (93.4 kg)   SpO2 96%   BMI 34.25 kg/m   Gen: NAD HEENT: watery eyes. TTP over b/l maxillary sinuses. R TM with bulging and effusion, irritated canal. L Tm with clear effusion. Oropharynx clear.  ASSESSMENT/PLAN:   Allergic sinusitis With progression to bacterial sinusitis given fevers and new R TM bulging. Continue augmentin. Will provide medrol dose pack. F/u if no better.    Ellwood Dense, DO

## 2020-11-12 NOTE — Assessment & Plan Note (Signed)
With progression to bacterial sinusitis given fevers and new R TM bulging. Continue augmentin. Will provide medrol dose pack. F/u if no better.

## 2020-11-19 ENCOUNTER — Ambulatory Visit
Admission: RE | Admit: 2020-11-19 | Discharge: 2020-11-19 | Disposition: A | Discharge status: Home / Self Care | Payer: PRIVATE HEALTH INSURANCE | Source: Ambulatory Visit | Attending: Family Medicine | Admitting: Family Medicine

## 2020-11-19 ENCOUNTER — Other Ambulatory Visit: Payer: Self-pay

## 2020-11-19 DIAGNOSIS — Z1231 Encounter for screening mammogram for malignant neoplasm of breast: Secondary | ICD-10-CM

## 2020-11-26 ENCOUNTER — Telehealth (INDEPENDENT_AMBULATORY_CARE_PROVIDER_SITE_OTHER): Payer: PRIVATE HEALTH INSURANCE | Admitting: Family Medicine

## 2020-11-26 ENCOUNTER — Encounter: Payer: Self-pay | Admitting: Family Medicine

## 2020-11-26 DIAGNOSIS — F419 Anxiety disorder, unspecified: Secondary | ICD-10-CM | POA: Diagnosis not present

## 2020-11-26 DIAGNOSIS — F909 Attention-deficit hyperactivity disorder, unspecified type: Secondary | ICD-10-CM | POA: Diagnosis not present

## 2020-11-26 DIAGNOSIS — R21 Rash and other nonspecific skin eruption: Secondary | ICD-10-CM | POA: Diagnosis not present

## 2020-11-26 MED ORDER — LISDEXAMFETAMINE DIMESYLATE 50 MG PO CAPS: 50.0000 mg | ORAL_CAPSULE | Freq: Every day | ORAL | 0 refills | Status: DC

## 2020-11-26 MED ORDER — TRIAMCINOLONE ACETONIDE 0.5 % EX OINT: 1.0000 "application " | TOPICAL_OINTMENT | Freq: Two times a day (BID) | CUTANEOUS | 0 refills | Status: DC

## 2020-11-26 MED ORDER — LORAZEPAM 1 MG PO TABS: 1.0000 mg | ORAL_TABLET | Freq: Every day | ORAL | 2 refills | Status: DC | PRN

## 2020-11-26 NOTE — Assessment & Plan Note (Signed)
Under good control on current regimen. Continue current regimen. Continue to monitor. Call with any concerns. Refills given for 3 months. Follow up 3 months.    

## 2020-11-26 NOTE — Progress Notes (Signed)
There were no vitals taken for this visit.   Subjective:    Patient ID: Tammy Mays, female    DOB: 1975/11/06, 45 y.o.   MRN: [id number]  HPI: Tammy Mays is a 45 y.o. female  Chief Complaint  Patient presents with  . ADHD  . Anxiety  . Rash    Pt states she has a small rash on her forearms that she noticed within the last week. States it itches just a little bit.    Had covid a couple of weeks ago, but she's doing better. Still having some congestion and a cough  ADHD FOLLOW UP ADHD status: controlled Satisfied with current therapy: no Medication compliance:  excellent compliance Controlled substance contract: yes Previous psychiatry evaluation: no Previous medications: no    Taking meds on weekends/vacations: yes Work/school performance:  excellent Difficulty sustaining attention/completing tasks: no Distracted by extraneous stimuli: no Does not listen when spoken to: no  Fidgets with hands or feet: no Unable to stay in seat: no Blurts out/interrupts others: no ADHD Medication Side Effects: no    Decreased appetite: no    Headache: no    Sleeping disturbance pattern: no    Irritability: no    Rebound effects (worse than baseline) off medication: no    Anxiousness: no    Dizziness: no    Tics: no  ANXIETY/STRESS Duration: chronic Status:controlled Anxious mood: yes  Excessive worrying: yes Irritability: no  Sweating: no Nausea: no Palpitations:no Hyperventilation: no Panic attacks: no Agoraphobia: no  Obscessions/compulsions: no Depressed mood: no Depression screen [facility] 2/9 11/26/2020 05/15/2020 04/02/2020 02/06/2020 09/30/2019  Decreased Interest 0 0 0 0 0  Down, Depressed, Hopeless 0 0 0 0 0  PHQ - 2 Score 0 0 0 0 0  Altered sleeping 0 2 2 1  0  Tired, decreased energy 0 0 0 0 0  Change in appetite 0 0 0 0 0  Feeling bad or failure about yourself  0 0 0 0 0  Trouble concentrating 0 0 0 0 0  Moving slowly or fidgety/restless 0 0 0 0 0  Suicidal thoughts  0 0 0 0 0  PHQ-9 Score 0 2 2 1  0  Difficult doing work/chores Not difficult at all - - - -  Some recent data might be hidden   Anhedonia: no Weight changes: no Insomnia: yes hard to fall asleep  Hypersomnia: no Fatigue/loss of energy: yes Feelings of worthlessness: no Feelings of guilt: no Impaired concentration/indecisiveness: no Suicidal ideations: no  Crying spells: no Recent Stressors/Life Changes: no   Relationship problems: no   Family stress: no     Financial stress: no    Job stress: no    Recent death/loss: no  RASH Duration:  2 weeks  Location: bilateral forearms  Itching: yes Burning: no Redness: yes Oozing: no Scaling: no Blisters: no Painful: no Fevers: no Change in detergents/soaps/personal care products: no Recent illness: yes Recent travel:no History of same: no Context: stable Alleviating factors: nothing Treatments attempted:nothing Shortness of breath: no  Throat/tongue swelling: no Myalgias/arthralgias: no  Relevant past medical, surgical, family and social history reviewed and updated as indicated. Interim medical history since our last visit reviewed. Allergies and medications reviewed and updated.  Review of Systems  Constitutional: Negative.   Respiratory: Negative.   Cardiovascular: Negative.   Gastrointestinal: Negative.   Musculoskeletal: Negative.   Skin: Positive for rash. Negative for color change, pallor and wound.  Psychiatric/Behavioral: Negative.     Per HPI unless  specifically indicated above     Objective:    There were no vitals taken for this visit.  Wt Readings from Last 3 Encounters:  11/12/20 205 lb 12.8 oz (93.4 kg)  09/08/20 210 lb (95.3 kg)  04/02/20 195 lb (88.5 kg)    Physical Exam Vitals and nursing note reviewed.  Pulmonary:     Effort: Pulmonary effort is normal. No respiratory distress.     Comments: Speaking in full sentences Neurological:     Mental Status: She is alert.  Psychiatric:         Mood and Affect: Mood normal.        Behavior: Behavior normal.        Thought Content: Thought content normal.        Judgment: Judgment normal.     Results for orders placed or performed in visit on 04/02/20  CBC with Differential/Platelet  Result Value Ref Range   WBC 6.1 3.4 - 10.8 x10E3/uL   RBC 4.89 3.77 - 5.28 x10E6/uL   Hemoglobin 15.0 11.1 - 15.9 g/dL   Hematocrit 78.4 69.6 - 46.6 %   MCV 91 79 - 97 fL   MCH 30.7 26.6 - 33.0 pg   MCHC 33.7 31.5 - 35.7 g/dL   RDW 29.5 28.4 - 13.2 %   Platelets 250 150 - 450 x10E3/uL   Neutrophils 53 Not Estab. %   Lymphs 32 Not Estab. %   Monocytes 12 Not Estab. %   Eos 2 Not Estab. %   Basos 1 Not Estab. %   Neutrophils Absolute 3.3 1.4 - 7.0 x10E3/uL   Lymphocytes Absolute 2.0 0.7 - 3.1 x10E3/uL   Monocytes Absolute 0.7 0.1 - 0.9 x10E3/uL   EOS (ABSOLUTE) 0.1 0.0 - 0.4 x10E3/uL   Basophils Absolute 0.0 0.0 - 0.2 x10E3/uL   Immature Granulocytes 0 Not Estab. %   Immature Grans (Abs) 0.0 0.0 - 0.1 x10E3/uL  Comprehensive metabolic panel  Result Value Ref Range   Glucose 63 (L) 65 - 99 mg/dL   BUN 14 6 - 24 mg/dL   Creatinine, Ser 4.40 0.57 - 1.00 mg/dL   GFR calc non Af Amer 90 >59 mL/min/1.73   GFR calc Af Amer 104 >59 mL/min/1.73   BUN/Creatinine Ratio 18 9 - 23   Sodium 140 134 - 144 mmol/L   Potassium 4.1 3.5 - 5.2 mmol/L   Chloride 97 96 - 106 mmol/L   CO2 21 20 - 29 mmol/L   Calcium 9.3 8.7 - 10.2 mg/dL   Total Protein 6.9 6.0 - 8.5 g/dL   Albumin 4.6 3.8 - 4.8 g/dL   Globulin, Total 2.3 1.5 - 4.5 g/dL   Albumin/Globulin Ratio 2.0 1.2 - 2.2   Bilirubin Total 0.4 0.0 - 1.2 mg/dL   Alkaline Phosphatase 63 48 - 121 IU/L   AST 16 0 - 40 IU/L   ALT 12 0 - 32 IU/L  Lipid Panel w/o Chol/HDL Ratio  Result Value Ref Range   Cholesterol, Total 159 100 - 199 mg/dL   Triglycerides 84 0 - 149 mg/dL   HDL 60 >10 mg/dL   VLDL Cholesterol Cal 16 5 - 40 mg/dL   LDL Chol Calc (NIH) 83 0 - 99 mg/dL  TSH  Result Value Ref Range    TSH 1.970 0.450 - 4.500 uIU/mL  UA/M w/rflx Culture, Routine   Specimen: Urine   URINE  Result Value Ref Range   Specific Gravity, UA 1.015 1.005 - 1.030   pH, UA  7.0 5.0 - 7.5   Color, UA Yellow Yellow   Appearance Ur Clear Clear   Leukocytes,UA Negative Negative   Protein,UA Negative Negative/Trace   Glucose, UA Negative Negative   Ketones, UA Negative Negative   RBC, UA Negative Negative   Bilirubin, UA Negative Negative   Urobilinogen, Ur 0.2 0.2 - 1.0 mg/dL   Nitrite, UA Negative Negative      Assessment & Plan:   Problem List Items Addressed This Visit      Other   Attention deficit hyperactivity disorder    Under good control on current regimen. Continue current regimen. Continue to monitor. Call with any concerns. Refills given for 3 months. Follow up 3 months.        Anxiety    Under good control on current regimen. Continue current regimen. Continue to monitor. Call with any concerns. Refills given for 3 months. Follow up 3 months.         Relevant Medications   LORazepam (ATIVAN) 1 MG tablet    Other Visit Diagnoses    Rash    -  Primary   Will treat with triamcinalone. Call with any concerns. Continue to montior.        Follow up plan: Return Follow up before 4/27 for ADD.   . This visit was completed via telephone due to the restrictions of the COVID-19 pandemic. All issues as above were discussed and addressed but no physical exam was performed. If it was felt that the patient should be evaluated in the office, they were directed there. The patient verbally consented to this visit. Patient was unable to complete an audio/visual visit due to Lack of equipment. Due to the catastrophic nature of the COVID-19 pandemic, this visit was done through audio contact only. . Location of the patient: home . Location of the provider: work . Those involved with this call:  . Provider: Olevia Perches, DO . CMA: Rolley Sims, CMA . Front Desk/Registration:  Harriet Pho  . Time spent on call: 25 minutes on the phone discussing health concerns. 40 minutes total spent in review of patient's record and preparation of their chart.

## 2021-01-10 ENCOUNTER — Other Ambulatory Visit: Payer: Self-pay | Admitting: Family Medicine

## 2021-01-11 NOTE — Telephone Encounter (Signed)
Just spoke with the pharmacy and was informed they are currently working on filling the patient prescription for pick up.

## 2021-01-11 NOTE — Telephone Encounter (Signed)
Can we call the pharmacy to find out if she has a refill and why they aren't able to fill it before I send it in?

## 2021-01-11 NOTE — Telephone Encounter (Signed)
Thank you :)

## 2021-02-07 ENCOUNTER — Other Ambulatory Visit: Payer: Self-pay | Admitting: Family Medicine

## 2021-02-07 NOTE — Telephone Encounter (Signed)
Requested Prescriptions  Pending Prescriptions Disp Refills  . ibuprofen (ADVIL) 800 MG tablet [Pharmacy Med Name: IBUPROFEN 800MG  TABLETS] 90 tablet 0    Sig: TAKE 1 TABLET(800 MG) BY MOUTH THREE TIMES DAILY     Analgesics:  NSAIDS Passed - 02/07/2021 10:09 AM      Passed - Cr in normal range and within 360 days    Creatinine, Ser  Date Value Ref Range Status  04/02/2020 0.79 0.57 - 1.00 mg/dL Final         Passed - HGB in normal range and within 360 days    Hemoglobin  Date Value Ref Range Status  04/02/2020 15.0 11.1 - 15.9 g/dL Final         Passed - Patient is not pregnant      Passed - Valid encounter within last 12 months    Recent Outpatient Visits          2 months ago Rash   [facility], [place], DO   2 months ago Allergic sinusitis   [facility] [place], DO   3 months ago Non-seasonal allergic rhinitis due to other allergic trigger   [facility], DO   5 months ago Attention deficit hyperactivity disorder (ADHD), unspecified ADHD type   [facility] [place], Megan P, DO   8 months ago Non-seasonal allergic rhinitis due to other allergic trigger   [facility], Particia Nearing      Future Appointments            In 1 week [place], NP [facility]

## 2021-02-15 ENCOUNTER — Encounter: Payer: Self-pay | Admitting: Nurse Practitioner

## 2021-02-15 ENCOUNTER — Other Ambulatory Visit: Payer: Self-pay

## 2021-02-15 ENCOUNTER — Ambulatory Visit (INDEPENDENT_AMBULATORY_CARE_PROVIDER_SITE_OTHER): Payer: PRIVATE HEALTH INSURANCE | Admitting: Nurse Practitioner

## 2021-02-15 ENCOUNTER — Encounter: Payer: Self-pay | Admitting: Family Medicine

## 2021-02-15 VITALS — BP 135/90 | HR 73 | Temp 97.1°F | Wt 205.0 lb

## 2021-02-15 DIAGNOSIS — F909 Attention-deficit hyperactivity disorder, unspecified type: Secondary | ICD-10-CM

## 2021-02-15 DIAGNOSIS — F419 Anxiety disorder, unspecified: Secondary | ICD-10-CM

## 2021-02-15 DIAGNOSIS — Z79899 Other long term (current) drug therapy: Secondary | ICD-10-CM

## 2021-02-15 NOTE — Assessment & Plan Note (Signed)
Under good control on current regimen. Discussed risks of using Benzodiazapine.  patient elects to continue medication.  Controlled substance agreement and UDS obtained at visit today.  Continue current regimen. Continue to monitor. Call with any concerns. Refills given for 3 months. Follow up 3 months.

## 2021-02-15 NOTE — Assessment & Plan Note (Addendum)
Under good control on current regimen.  Controlled substance agreement and UDS obtained at visit today.  Continue current regimen. Continue to monitor. Call with any concerns. Refills given for 3 months. Follow up 3 months.

## 2021-02-15 NOTE — Progress Notes (Signed)
BP 135/90   Pulse 73   Temp (!) 97.1 F (36.2 C)   Wt 205 lb (93 kg)   SpO2 100%   BMI 34.11 kg/m    Subjective:    Patient ID: Tammy Mays, female    DOB: 05-02-1976, 45 y.o.   MRN: [id number]  HPI: Tammy Mays is a 45 y.o. female  Chief Complaint  Patient presents with  . Allergies  . ADD    ADHD FOLLOW UP ADHD status: stable Satisfied with current therapy: yes Medication compliance:  excellent compliance Controlled substance contract: yes Previous psychiatry evaluation: no Previous medications: no vyvanse (lisdexamfethamine)   Taking meds on weekends/vacations: yes Work/school performance:  good Difficulty sustaining attention/completing tasks: no Distracted by extraneous stimuli: no Does not listen when spoken to: no  Fidgets with hands or feet: no Unable to stay in seat: no Blurts out/interrupts others: no ADHD Medication Side Effects: no    Decreased appetite: no    Headache: no    Sleeping disturbance pattern: no    Irritability: no    Rebound effects (worse than baseline) off medication: no    Anxiousness: no    Dizziness: no    Tics: no  ANXIETY/STRESS Duration:controlled Anxious mood: yes  Excessive worrying: yes Irritability: no  Sweating: no Nausea: no Palpitations:no Hyperventilation: no Panic attacks: no Agoraphobia: no  Obscessions/compulsions: no Depressed mood: no Depression screen [facility] 2/9 11/26/2020 05/15/2020 04/02/2020 02/06/2020 09/30/2019  Decreased Interest 0 0 0 0 0  Down, Depressed, Hopeless 0 0 0 0 0  PHQ - 2 Score 0 0 0 0 0  Altered sleeping 0 2 2 1  0  Tired, decreased energy 0 0 0 0 0  Change in appetite 0 0 0 0 0  Feeling bad or failure about yourself  0 0 0 0 0  Trouble concentrating 0 0 0 0 0  Moving slowly or fidgety/restless 0 0 0 0 0  Suicidal thoughts 0 0 0 0 0  PHQ-9 Score 0 2 2 1  0  Difficult doing work/chores Not difficult at all - - - -  Some recent data might be hidden   Anhedonia: no Weight changes:  no Insomnia: no hard to stay asleep  Hypersomnia: no Fatigue/loss of energy: yes Feelings of worthlessness: no Feelings of guilt: no Impaired concentration/indecisiveness: no Suicidal ideations: no  Crying spells: no Recent Stressors/Life Changes: no   Relationship problems: no   Family stress: no     Financial stress: no    Job stress: no    Recent death/loss: no  Relevant past medical, surgical, family and social history reviewed and updated as indicated. Interim medical history since our last visit reviewed. Allergies and medications reviewed and updated.  Review of Systems  Eyes: Negative for visual disturbance.  Respiratory: Negative for cough, chest tightness and shortness of breath.   Cardiovascular: Negative for chest pain, palpitations and leg swelling.  Neurological: Negative for dizziness and headaches.  Psychiatric/Behavioral: Positive for decreased concentration. The patient is nervous/anxious.     Per HPI unless specifically indicated above     Objective:    BP 135/90   Pulse 73   Temp (!) 97.1 F (36.2 C)   Wt 205 lb (93 kg)   SpO2 100%   BMI 34.11 kg/m   Wt Readings from Last 3 Encounters:  02/15/21 205 lb (93 kg)  11/12/20 205 lb 12.8 oz (93.4 kg)  09/08/20 210 lb (95.3 kg)    Physical Exam Vitals and  nursing note reviewed.  Constitutional:      General: She is not in acute distress.    Appearance: Normal appearance. She is normal weight. She is not ill-appearing, toxic-appearing or diaphoretic.  HENT:     Head: Normocephalic.     Right Ear: External ear normal. A middle ear effusion is present. Tympanic membrane is not erythematous.     Left Ear: External ear normal. A middle ear effusion is present. Tympanic membrane is not erythematous.     Nose: Nose normal.     Mouth/Throat:     Mouth: Mucous membranes are moist.     Pharynx: Oropharynx is clear.  Eyes:     General:        Right eye: No discharge.        Left eye: No discharge.      Extraocular Movements: Extraocular movements intact.     Conjunctiva/sclera: Conjunctivae normal.     Pupils: Pupils are equal, round, and reactive to light.  Cardiovascular:     Rate and Rhythm: Normal rate and regular rhythm.     Heart sounds: No murmur heard.   Pulmonary:     Effort: Pulmonary effort is normal. No respiratory distress.     Breath sounds: Normal breath sounds. No wheezing or rales.  Musculoskeletal:     Cervical back: Normal range of motion and neck supple.  Skin:    General: Skin is warm and dry.     Capillary Refill: Capillary refill takes less than 2 seconds.  Neurological:     General: No focal deficit present.     Mental Status: She is alert and oriented to person, place, and time. Mental status is at baseline.  Psychiatric:        Mood and Affect: Mood normal.        Behavior: Behavior normal.        Thought Content: Thought content normal.        Judgment: Judgment normal.     Results for orders placed or performed in visit on 04/02/20  CBC with Differential/Platelet  Result Value Ref Range   WBC 6.1 3.4 - 10.8 x10E3/uL   RBC 4.89 3.77 - 5.28 x10E6/uL   Hemoglobin 15.0 11.1 - 15.9 g/dL   Hematocrit 38.1 82.9 - 46.6 %   MCV 91 79 - 97 fL   MCH 30.7 26.6 - 33.0 pg   MCHC 33.7 31.5 - 35.7 g/dL   RDW 93.7 16.9 - 67.8 %   Platelets 250 150 - 450 x10E3/uL   Neutrophils 53 Not Estab. %   Lymphs 32 Not Estab. %   Monocytes 12 Not Estab. %   Eos 2 Not Estab. %   Basos 1 Not Estab. %   Neutrophils Absolute 3.3 1.4 - 7.0 x10E3/uL   Lymphocytes Absolute 2.0 0.7 - 3.1 x10E3/uL   Monocytes Absolute 0.7 0.1 - 0.9 x10E3/uL   EOS (ABSOLUTE) 0.1 0.0 - 0.4 x10E3/uL   Basophils Absolute 0.0 0.0 - 0.2 x10E3/uL   Immature Granulocytes 0 Not Estab. %   Immature Grans (Abs) 0.0 0.0 - 0.1 x10E3/uL  Comprehensive metabolic panel  Result Value Ref Range   Glucose 63 (L) 65 - 99 mg/dL   BUN 14 6 - 24 mg/dL   Creatinine, Ser 9.38 0.57 - 1.00 mg/dL   GFR calc non  Af Amer 90 >59 mL/min/1.73   GFR calc Af Amer 104 >59 mL/min/1.73   BUN/Creatinine Ratio 18 9 - 23   Sodium 140 134 -  144 mmol/L   Potassium 4.1 3.5 - 5.2 mmol/L   Chloride 97 96 - 106 mmol/L   CO2 21 20 - 29 mmol/L   Calcium 9.3 8.7 - 10.2 mg/dL   Total Protein 6.9 6.0 - 8.5 g/dL   Albumin 4.6 3.8 - 4.8 g/dL   Globulin, Total 2.3 1.5 - 4.5 g/dL   Albumin/Globulin Ratio 2.0 1.2 - 2.2   Bilirubin Total 0.4 0.0 - 1.2 mg/dL   Alkaline Phosphatase 63 48 - 121 IU/L   AST 16 0 - 40 IU/L   ALT 12 0 - 32 IU/L  Lipid Panel w/o Chol/HDL Ratio  Result Value Ref Range   Cholesterol, Total 159 100 - 199 mg/dL   Triglycerides 84 0 - 149 mg/dL   HDL 60 >38 mg/dL   VLDL Cholesterol Cal 16 5 - 40 mg/dL   LDL Chol Calc (NIH) 83 0 - 99 mg/dL  TSH  Result Value Ref Range   TSH 1.970 0.450 - 4.500 uIU/mL  UA/M w/rflx Culture, Routine   Specimen: Urine   URINE  Result Value Ref Range   Specific Gravity, UA 1.015 1.005 - 1.030   pH, UA 7.0 5.0 - 7.5   Color, UA Yellow Yellow   Appearance Ur Clear Clear   Leukocytes,UA Negative Negative   Protein,UA Negative Negative/Trace   Glucose, UA Negative Negative   Ketones, UA Negative Negative   RBC, UA Negative Negative   Bilirubin, UA Negative Negative   Urobilinogen, Ur 0.2 0.2 - 1.0 mg/dL   Nitrite, UA Negative Negative      Assessment & Plan:   Problem List Items Addressed This Visit      Other   Attention deficit hyperactivity disorder    Under good control on current regimen.  Controlled substance agreement and UDS obtained at visit today.  Continue current regimen. Continue to monitor. Call with any concerns. Refills given for 3 months. Follow up 3 months.        Relevant Orders   [id number] 11+Oxyco+Alc+Crt-Bund   Anxiety - Primary    Under good control on current regimen. Discussed risks of using Benzodiazapine.  patient elects to continue medication.  Controlled substance agreement and UDS obtained at visit today.  Continue current  regimen. Continue to monitor. Call with any concerns. Refills given for 3 months. Follow up 3 months.         Relevant Orders   [id number] 11+Oxyco+Alc+Crt-Bund    Other Visit Diagnoses    Controlled substance agreement signed       Signed in office today.  UDS obtained.   Relevant Orders   [id number] 11+Oxyco+Alc+Crt-Bund       Follow up plan: Return in about 2 months (around 04/17/2021) for Physical and Fasting labs, Depression/Anxiety FU, ADHD FU.

## 2021-02-16 MED ORDER — LISDEXAMFETAMINE DIMESYLATE 50 MG PO CAPS: 50.0000 mg | ORAL_CAPSULE | Freq: Every day | ORAL | 0 refills | Status: DC

## 2021-02-16 MED ORDER — LORAZEPAM 1 MG PO TABS: 1.0000 mg | ORAL_TABLET | Freq: Every day | ORAL | 2 refills | Status: DC | PRN

## 2021-02-22 LAB — DRUG SCREEN 764883 11+OXYCO+ALC+CRT-BUND
BENZODIAZ UR QL: NEGATIVE ng/mL
Barbiturate: NEGATIVE ng/mL
Cannabinoid Quant, Ur: NEGATIVE ng/mL
Cocaine (Metabolite): NEGATIVE ng/mL
Creatinine: 21.3 mg/dL (ref 20.0–300.0)
Ethanol: NEGATIVE %
Meperidine: NEGATIVE ng/mL
Methadone Screen, Urine: NEGATIVE ng/mL
OPIATE SCREEN URINE: NEGATIVE ng/mL
Oxycodone/Oxymorphone, Urine: NEGATIVE ng/mL
Phencyclidine: NEGATIVE ng/mL
Propoxyphene: NEGATIVE ng/mL
Tramadol: NEGATIVE ng/mL
pH, Urine: 6.1 (ref 4.5–8.9)

## 2021-02-22 LAB — DRUG PROFILE 799016
Amphetamine GC/MS Conf: 2074 ng/mL
Amphetamine: POSITIVE — AB
Amphetamines: POSITIVE — AB
Methamphetamine: NEGATIVE

## 2021-02-23 NOTE — Progress Notes (Signed)
Hi Tammy Mays.  Your urine drug screen came back positive for Amphetamines which was expected. See you at our next visit.

## 2021-03-30 ENCOUNTER — Ambulatory Visit: Payer: PRIVATE HEALTH INSURANCE | Admitting: Nurse Practitioner

## 2021-03-30 ENCOUNTER — Other Ambulatory Visit: Payer: Self-pay

## 2021-03-30 ENCOUNTER — Ambulatory Visit: Payer: PRIVATE HEALTH INSURANCE | Admitting: Family Medicine

## 2021-03-30 ENCOUNTER — Encounter: Payer: Self-pay | Admitting: Family Medicine

## 2021-03-30 VITALS — BP 141/92 | HR 67 | Temp 97.4°F | Wt 210.0 lb

## 2021-03-30 DIAGNOSIS — H6983 Other specified disorders of Eustachian tube, bilateral: Secondary | ICD-10-CM | POA: Diagnosis not present

## 2021-03-30 MED ORDER — PREDNISONE 50 MG PO TABS: 50.0000 mg | ORAL_TABLET | Freq: Every day | ORAL | 0 refills | Status: DC

## 2021-03-30 NOTE — Progress Notes (Signed)
BP (!) 141/92   Pulse 67   Temp (!) 97.4 F (36.3 C)   Wt 210 lb (95.3 kg)   SpO2 99%   BMI 34.95 kg/m    Subjective:    Patient ID: Tammy Mays, female    DOB: 1975/11/09, 45 y.o.   MRN: [id number]  HPI: Tammy Mays is a 45 y.o. female  Chief Complaint  Patient presents with  . Ear Pain    Patient states she is having ear pain and ear fullness.  . Cough    Patient states she is having slight cough and some sore throat    UPPER RESPIRATORY TRACT INFECTION Duration: 4 days Worst symptom: congestion Fever: no Cough: yes Shortness of breath: no Wheezing: no Chest pain: no Chest tightness: no Chest congestion: no Nasal congestion: yes Runny nose: yes Post nasal drip: yes Sneezing: no Sore throat: yes Swollen glands: yes Sinus pressure: no Headache: yes Face pain: no Toothache: no Ear pain: yes bilateral Ear pressure: yes bilateral Eyes red/itching:no Eye drainage/crusting: no  Vomiting: no Rash: no Fatigue: yes Sick contacts: no Strep contacts: no  Context: worse Recurrent sinusitis: no Relief with OTC cold/cough medications: no  Treatments attempted: flonase, nasal rinse, astalin   Relevant past medical, surgical, family and social history reviewed and updated as indicated. Interim medical history since our last visit reviewed. Allergies and medications reviewed and updated.  Review of Systems  Constitutional: Negative.   HENT: Positive for congestion and sore throat. Negative for dental problem, drooling, ear discharge, ear pain, facial swelling, hearing loss, mouth sores, nosebleeds, postnasal drip, rhinorrhea, sinus pressure, sinus pain, sneezing, tinnitus, trouble swallowing and voice change.   Eyes: Negative.   Respiratory: Negative.   Cardiovascular: Negative.   Gastrointestinal: Negative.   Psychiatric/Behavioral: Negative.     Per HPI unless specifically indicated above     Objective:    BP (!) 141/92   Pulse 67   Temp (!) 97.4 F  (36.3 C)   Wt 210 lb (95.3 kg)   SpO2 99%   BMI 34.95 kg/m   Wt Readings from Last 3 Encounters:  03/30/21 210 lb (95.3 kg)  02/15/21 205 lb (93 kg)  11/12/20 205 lb 12.8 oz (93.4 kg)    Physical Exam Vitals and nursing note reviewed.  Constitutional:      General: She is not in acute distress.    Appearance: Normal appearance. She is not ill-appearing, toxic-appearing or diaphoretic.  HENT:     Head: Normocephalic and atraumatic.     Right Ear: External ear normal. A middle ear effusion is present.     Left Ear: External ear normal. A middle ear effusion is present.     Nose: Nose normal.     Mouth/Throat:     Mouth: Mucous membranes are moist.     Pharynx: Oropharynx is clear.  Eyes:     General: No scleral icterus.       Right eye: No discharge.        Left eye: No discharge.     Extraocular Movements: Extraocular movements intact.     Conjunctiva/sclera: Conjunctivae normal.     Pupils: Pupils are equal, round, and reactive to light.  Cardiovascular:     Rate and Rhythm: Normal rate and regular rhythm.     Pulses: Normal pulses.     Heart sounds: Normal heart sounds. No murmur heard. No friction rub. No gallop.   Pulmonary:     Effort: Pulmonary effort is  normal. No respiratory distress.     Breath sounds: Normal breath sounds. No stridor. No wheezing, rhonchi or rales.  Chest:     Chest wall: No tenderness.  Musculoskeletal:        General: Normal range of motion.     Cervical back: Normal range of motion and neck supple.  Skin:    General: Skin is warm and dry.     Capillary Refill: Capillary refill takes less than 2 seconds.     Coloration: Skin is not jaundiced or pale.     Findings: No bruising, erythema, lesion or rash.  Neurological:     General: No focal deficit present.     Mental Status: She is alert and oriented to person, place, and time. Mental status is at baseline.  Psychiatric:        Mood and Affect: Mood normal.        Behavior: Behavior  normal.        Thought Content: Thought content normal.        Judgment: Judgment normal.     Results for orders placed or performed in visit on 02/15/21  [id number] 11+Oxyco+Alc+Crt-Bund  Result Value Ref Range   Ethanol Negative Cutoff=0.020 %   Amphetamines, Urine See Final Results Cutoff=1000 ng/mL   Barbiturate Negative Cutoff=200 ng/mL   BENZODIAZ UR QL Negative Cutoff=200 ng/mL   Cannabinoid Quant, Ur Negative Cutoff=50 ng/mL   Cocaine (Metabolite) Negative Cutoff=300 ng/mL   OPIATE SCREEN URINE Negative Cutoff=300 ng/mL   Oxycodone/Oxymorphone, Urine Negative Cutoff=300 ng/mL   Phencyclidine Negative Cutoff=25 ng/mL   Methadone Screen, Urine Negative Cutoff=300 ng/mL   Propoxyphene Negative Cutoff=300 ng/mL   Meperidine Negative Cutoff=200 ng/mL   Tramadol Negative Cutoff=200 ng/mL   Creatinine 21.3 20.0 - 300.0 mg/dL   pH, Urine 6.1 4.5 - 8.9  Drug Profile [phone]  Result Value Ref Range   Amphetamines Positive (A) Cutoff=1000   Amphetamine Positive (A)    Amphetamine GC/MS Conf 2,074 Cutoff=500 ng/mL   Methamphetamine Negative Cutoff=500      Assessment & Plan:   Problem List Items Addressed This Visit   None   Visit Diagnoses    Dysfunction of both eustachian tubes    -  Primary   Will treat with prednisone burst. Return for recheck in 2 weeks. If not better, may need to see ENT as this is recurrent.        Follow up plan: Return in about 2 weeks (around 04/13/2021) for recheck ears.

## 2021-04-14 ENCOUNTER — Ambulatory Visit (INDEPENDENT_AMBULATORY_CARE_PROVIDER_SITE_OTHER): Payer: PRIVATE HEALTH INSURANCE | Admitting: Nurse Practitioner

## 2021-04-14 ENCOUNTER — Encounter: Payer: Self-pay | Admitting: Nurse Practitioner

## 2021-04-14 ENCOUNTER — Other Ambulatory Visit: Payer: Self-pay

## 2021-04-14 VITALS — BP 130/94 | HR 87 | Temp 98.6°F | Wt 210.0 lb

## 2021-04-14 DIAGNOSIS — H6983 Other specified disorders of Eustachian tube, bilateral: Secondary | ICD-10-CM | POA: Diagnosis not present

## 2021-04-14 MED ORDER — LISDEXAMFETAMINE DIMESYLATE 50 MG PO CAPS: 50.0000 mg | ORAL_CAPSULE | Freq: Every day | ORAL | 0 refills | Status: DC

## 2021-04-14 MED ORDER — FEXOFENADINE HCL 180 MG PO TABS: 180.0000 mg | ORAL_TABLET | Freq: Every day | ORAL | 1 refills | Status: DC

## 2021-04-14 MED ORDER — MONTELUKAST SODIUM 10 MG PO TABS: ORAL_TABLET | ORAL | 3 refills | Status: DC

## 2021-04-14 NOTE — Progress Notes (Signed)
BP (!) 130/94   Pulse 87   Temp 98.6 F (37 C)   Wt 210 lb (95.3 kg)   SpO2 100%   BMI 34.95 kg/m    Subjective:    Patient ID: Tammy Mays, female    DOB: 02/21/1976, 45 y.o.   MRN: [id number]  HPI: Tammy Mays is a 45 y.o. female  Chief Complaint  Patient presents with   ear check   Nasal Congestion   Patient states during the day she is getting a lot of congestion. She feels like the prednisone helped with the fluid in her ears.  States that she is taking Zyrtec daily, using flonase and the nasal wash.  She is still having congestion during the day. Denies ear pain.   Relevant past medical, surgical, family and social history reviewed and updated as indicated. Interim medical history since our last visit reviewed. Allergies and medications reviewed and updated.  Review of Systems  HENT:  Positive for congestion. Negative for ear pain.    Per HPI unless specifically indicated above     Objective:    BP (!) 130/94   Pulse 87   Temp 98.6 F (37 C)   Wt 210 lb (95.3 kg)   SpO2 100%   BMI 34.95 kg/m   Wt Readings from Last 3 Encounters:  04/14/21 210 lb (95.3 kg)  03/30/21 210 lb (95.3 kg)  02/15/21 205 lb (93 kg)    Physical Exam Vitals and nursing note reviewed.  Constitutional:      General: She is not in acute distress.    Appearance: Normal appearance. She is normal weight. She is not ill-appearing, toxic-appearing or diaphoretic.  HENT:     Head: Normocephalic.     Right Ear: External ear normal. A middle ear effusion is present.     Left Ear: External ear normal. A middle ear effusion is present.     Nose: Congestion present.     Mouth/Throat:     Mouth: Mucous membranes are moist.     Pharynx: Oropharynx is clear.  Eyes:     General:        Right eye: No discharge.        Left eye: No discharge.     Extraocular Movements: Extraocular movements intact.     Conjunctiva/sclera: Conjunctivae normal.     Pupils: Pupils are equal, round, and  reactive to light.  Cardiovascular:     Rate and Rhythm: Normal rate and regular rhythm.     Heart sounds: No murmur heard. Pulmonary:     Effort: Pulmonary effort is normal. No respiratory distress.     Breath sounds: Normal breath sounds. No wheezing or rales.  Musculoskeletal:     Cervical back: Normal range of motion and neck supple.  Skin:    General: Skin is warm and dry.     Capillary Refill: Capillary refill takes less than 2 seconds.  Neurological:     General: No focal deficit present.     Mental Status: She is alert and oriented to person, place, and time. Mental status is at baseline.  Psychiatric:        Mood and Affect: Mood normal.        Behavior: Behavior normal.        Thought Content: Thought content normal.        Judgment: Judgment normal.    Results for orders placed or performed in visit on 02/15/21  [id number] 11+Oxyco+Alc+Crt-Bund  Result Value  Ref Range   Ethanol Negative Cutoff=0.020 %   Amphetamines, Urine See Final Results Cutoff=1000 ng/mL   Barbiturate Negative Cutoff=200 ng/mL   BENZODIAZ UR QL Negative Cutoff=200 ng/mL   Cannabinoid Quant, Ur Negative Cutoff=50 ng/mL   Cocaine (Metabolite) Negative Cutoff=300 ng/mL   OPIATE SCREEN URINE Negative Cutoff=300 ng/mL   Oxycodone/Oxymorphone, Urine Negative Cutoff=300 ng/mL   Phencyclidine Negative Cutoff=25 ng/mL   Methadone Screen, Urine Negative Cutoff=300 ng/mL   Propoxyphene Negative Cutoff=300 ng/mL   Meperidine Negative Cutoff=200 ng/mL   Tramadol Negative Cutoff=200 ng/mL   Creatinine 21.3 20.0 - 300.0 mg/dL   pH, Urine 6.1 4.5 - 8.9  Drug Profile [phone]  Result Value Ref Range   Amphetamines Positive (A) Cutoff=1000   Amphetamine Positive (A)    Amphetamine GC/MS Conf 2,074 Cutoff=500 ng/mL   Methamphetamine Negative Cutoff=500      Assessment & Plan:   Problem List Items Addressed This Visit   None Visit Diagnoses     Dysfunction of both eustachian tubes    -  Primary   Recommend  using singulair. Continue zyrtec or allegra daily. Recommend she see ENT. She will hold off for now until after her trip and follow up about referral.        Follow up plan: Return if symptoms worsen or fail to improve.    A total of 20 minutes were spent on this encounter today.  When total time is documented, this includes both the face-to-face and non-face-to-face time personally spent before, during and after the visit on the date of the encounter.

## 2021-04-30 ENCOUNTER — Other Ambulatory Visit: Payer: Self-pay | Admitting: Family Medicine

## 2021-05-31 ENCOUNTER — Other Ambulatory Visit: Payer: Self-pay | Admitting: Family Medicine

## 2021-05-31 NOTE — Telephone Encounter (Signed)
Requested Prescriptions  Pending Prescriptions Disp Refills  . fluticasone (FLONASE) 50 MCG/ACT nasal spray [Pharmacy Med Name: FLUTICASONE NASAL SP (120) RX] 16 g 11    Sig: SHAKE LIQUID AND USE 2 SPRAYS IN EACH NOSTRIL EVERY DAY     Ear, Nose, and Throat: Nasal Preparations - Corticosteroids Passed - 05/31/2021  3:18 AM      Passed - Valid encounter within last 12 months    Recent Outpatient Visits          1 month ago Dysfunction of both eustachian tubes   [facility] Larae Grooms, NP   2 months ago Dysfunction of both eustachian tubes   [facility] [place], [place], DO   3 months ago Anxiety   [facility] Larae Grooms, NP   6 months ago Rash   [facility] [place], [place], DO   6 months ago Allergic sinusitis   [facility] Caro Laroche, DO

## 2021-06-03 ENCOUNTER — Other Ambulatory Visit: Payer: Self-pay | Admitting: Nurse Practitioner

## 2021-06-04 NOTE — Telephone Encounter (Signed)
Requested medications are due for refill today yes  Requested medications are on the active medication list yes  Last refill 7/7  Last visit 01/2021  Future visit scheduled no  Notes to clinic Not Delegated.

## 2021-06-04 NOTE — Telephone Encounter (Signed)
Last routine visit was in April

## 2021-06-29 ENCOUNTER — Other Ambulatory Visit: Payer: Self-pay | Admitting: Family Medicine

## 2021-06-29 NOTE — Telephone Encounter (Signed)
Requested medication (s) are due for refill today: yes  Requested medication (s) are on the active medication list: yes  Last refill:  05/15/20  Future visit scheduled: no  Notes to clinic:  Provider prescribed t a UC visit Roosvelt Maser PA) Please review.Pt has been seen multiple times in office for sinus issues.   Requested Prescriptions  Pending Prescriptions Disp Refills   azelastine (ASTELIN) 0.1 % nasal spray [Pharmacy Med Name: AZELASTINE 0.1%(137MCG) NASAL-200SP] 30 mL 12    Sig: USE 1 TO 2 SPRAYS IN EACH NOSTRIL TWICE DAILY     Ear, Nose, and Throat: Nasal Preparations - Antiallergy Passed - 06/29/2021  9:12 AM      Passed - Valid encounter within last 12 months    Recent Outpatient Visits           2 months ago Dysfunction of both eustachian tubes   [facility] [place] Larae Grooms, NP   3 months ago Dysfunction of both eustachian tubes   [facility] [place], [place], DO   4 months ago Anxiety   [facility] Larae Grooms, NP   7 months ago Rash   [facility] [place], [place], DO   7 months ago Allergic sinusitis   [facility] Caro Laroche, DO

## 2021-07-05 ENCOUNTER — Other Ambulatory Visit: Payer: Self-pay | Admitting: Nurse Practitioner

## 2021-07-06 NOTE — Telephone Encounter (Signed)
Requested medication (s) are due for refill today: yes  Requested medication (s) are on the active medication list: yes  Last refill:  06/06/21 #30  Future visit scheduled: no  Notes to clinic:  Please review for refill. Refill not delegated per protocol.     Requested Prescriptions  Pending Prescriptions Disp Refills   LORazepam (ATIVAN) 1 MG tablet [Pharmacy Med Name: LORAZEPAM 1MG  TABLETS] 30 tablet     Sig: TAKE 1 TABLET(1 MG) BY MOUTH DAILY AS NEEDED FOR ANXIETY     Not Delegated - Psychiatry:  Anxiolytics/Hypnotics Failed - 07/05/2021  8:34 PM      Failed - This refill cannot be delegated      Passed - Urine Drug Screen completed in last 360 days      Passed - Valid encounter within last 6 months    Recent Outpatient Visits           2 months ago Dysfunction of both eustachian tubes   [facility], NP   3 months ago Dysfunction of both eustachian tubes   [facility] [place], [place], DO   4 months ago Anxiety   [facility], NP   7 months ago Rash   [facility] [place], [place], DO   7 months ago Allergic sinusitis   [facility], DO

## 2021-08-01 ENCOUNTER — Telehealth: Payer: Self-pay

## 2021-08-02 MED ORDER — LISDEXAMFETAMINE DIMESYLATE 50 MG PO CAPS: 50.0000 mg | ORAL_CAPSULE | Freq: Every day | ORAL | 0 refills | Status: DC

## 2021-08-02 NOTE — Telephone Encounter (Signed)
Pt is scheduled for 08/09/2021 at 11 am.

## 2021-08-03 ENCOUNTER — Other Ambulatory Visit: Payer: Self-pay | Admitting: Family Medicine

## 2021-08-03 NOTE — Telephone Encounter (Signed)
Future visit in 6 days  

## 2021-08-05 ENCOUNTER — Other Ambulatory Visit: Payer: Self-pay | Admitting: Nurse Practitioner

## 2021-08-05 ENCOUNTER — Encounter: Payer: Self-pay | Admitting: Family Medicine

## 2021-08-05 ENCOUNTER — Other Ambulatory Visit: Payer: Self-pay

## 2021-08-05 MED ORDER — LORAZEPAM 1 MG PO TABS: ORAL_TABLET | ORAL | 0 refills | Status: DC

## 2021-08-05 NOTE — Telephone Encounter (Signed)
Requested medications are due for refill today.  yes  Requested medications are on the active medications list.  yes  Last refill. 07/07/2021  Future visit scheduled.   yes  Notes to clinic.  Medication not delegated.

## 2021-08-05 NOTE — Telephone Encounter (Signed)
Patient is overdue for an appt.

## 2021-08-05 NOTE — Telephone Encounter (Signed)
I will send her a courtesy refill but let her know in the future she needs to be seen every 3 months to maintain her medications.

## 2021-08-09 ENCOUNTER — Ambulatory Visit: Payer: PRIVATE HEALTH INSURANCE | Admitting: Nurse Practitioner

## 2021-08-09 NOTE — Progress Notes (Deleted)
There were no vitals taken for this visit.   Subjective:    Patient ID: Tammy Mays, female    DOB: [birth year], 45 y.o.   MRN: [id number]  HPI: Tammy Mays is a 45 y.o. female  No chief complaint on file.   ADHD FOLLOW UP ADHD status: stable Satisfied with current therapy: yes Medication compliance:  excellent compliance Controlled substance contract: yes Previous psychiatry evaluation: no Previous medications: no vyvanse (lisdexamfethamine)   Taking meds on weekends/vacations: yes Work/school performance:  good Difficulty sustaining attention/completing tasks: no Distracted by extraneous stimuli: no Does not listen when spoken to: no  Fidgets with hands or feet: no Unable to stay in seat: no Blurts out/interrupts others: no ADHD Medication Side Effects: no    Decreased appetite: no    Headache: no    Sleeping disturbance pattern: no    Irritability: no    Rebound effects (worse than baseline) off medication: no    Anxiousness: no    Dizziness: no    Tics: no  ANXIETY/STRESS Duration:controlled Anxious mood: yes  Excessive worrying: yes Irritability: no  Sweating: no Nausea: no Palpitations:no Hyperventilation: no Panic attacks: no Agoraphobia: no  Obscessions/compulsions: no Depressed mood: no Depression screen [facility] 2/9 11/26/2020 05/15/2020 04/02/2020 02/06/2020 09/30/2019  Decreased Interest 0 0 0 0 0  Down, Depressed, Hopeless 0 0 0 0 0  PHQ - 2 Score 0 0 0 0 0  Altered sleeping 0 2 2 1  0  Tired, decreased energy 0 0 0 0 0  Change in appetite 0 0 0 0 0  Feeling bad or failure about yourself  0 0 0 0 0  Trouble concentrating 0 0 0 0 0  Moving slowly or fidgety/restless 0 0 0 0 0  Suicidal thoughts 0 0 0 0 0  PHQ-9 Score 0 2 2 1  0  Difficult doing work/chores Not difficult at all - - - -  Some recent data might be hidden   Anhedonia: no Weight changes: no Insomnia: no hard to stay asleep  Hypersomnia: no Fatigue/loss of energy: yes Feelings of  worthlessness: no Feelings of guilt: no Impaired concentration/indecisiveness: no Suicidal ideations: no  Crying spells: no Recent Stressors/Life Changes: no   Relationship problems: no   Family stress: no     Financial stress: no    Job stress: no    Recent death/loss: no  Relevant past medical, surgical, family and social history reviewed and updated as indicated. Interim medical history since our last visit reviewed. Allergies and medications reviewed and updated.  Review of Systems  Eyes: Negative for visual disturbance.  Respiratory: Negative for cough, chest tightness and shortness of breath.   Cardiovascular: Negative for chest pain, palpitations and leg swelling.  Neurological: Negative for dizziness and headaches.  Psychiatric/Behavioral: Positive for decreased concentration. The patient is nervous/anxious.     Per HPI unless specifically indicated above     Objective:    There were no vitals taken for this visit.  Wt Readings from Last 3 Encounters:  04/14/21 210 lb (95.3 kg)  03/30/21 210 lb (95.3 kg)  02/15/21 205 lb (93 kg)    Physical Exam Vitals and nursing note reviewed.  Constitutional:      General: She is not in acute distress.    Appearance: Normal appearance. She is normal weight. She is not ill-appearing, toxic-appearing or diaphoretic.  HENT:     Head: Normocephalic.     Right Ear: External ear normal. A middle ear effusion is  present. Tympanic membrane is not erythematous.     Left Ear: External ear normal. A middle ear effusion is present. Tympanic membrane is not erythematous.     Nose: Nose normal.     Mouth/Throat:     Mouth: Mucous membranes are moist.     Pharynx: Oropharynx is clear.  Eyes:     General:        Right eye: No discharge.        Left eye: No discharge.     Extraocular Movements: Extraocular movements intact.     Conjunctiva/sclera: Conjunctivae normal.     Pupils: Pupils are equal, round, and reactive to light.   Cardiovascular:     Rate and Rhythm: Normal rate and regular rhythm.     Heart sounds: No murmur heard.   Pulmonary:     Effort: Pulmonary effort is normal. No respiratory distress.     Breath sounds: Normal breath sounds. No wheezing or rales.  Musculoskeletal:     Cervical back: Normal range of motion and neck supple.  Skin:    General: Skin is warm and dry.     Capillary Refill: Capillary refill takes less than 2 seconds.  Neurological:     General: No focal deficit present.     Mental Status: She is alert and oriented to person, place, and time. Mental status is at baseline.  Psychiatric:        Mood and Affect: Mood normal.        Behavior: Behavior normal.        Thought Content: Thought content normal.        Judgment: Judgment normal.     Results for orders placed or performed in visit on 02/15/21  [id number] 11+Oxyco+Alc+Crt-Bund  Result Value Ref Range   Ethanol Negative Cutoff=0.020 %   Amphetamines, Urine See Final Results Cutoff=1000 ng/mL   Barbiturate Negative Cutoff=200 ng/mL   BENZODIAZ UR QL Negative Cutoff=200 ng/mL   Cannabinoid Quant, Ur Negative Cutoff=50 ng/mL   Cocaine (Metabolite) Negative Cutoff=300 ng/mL   OPIATE SCREEN URINE Negative Cutoff=300 ng/mL   Oxycodone/Oxymorphone, Urine Negative Cutoff=300 ng/mL   Phencyclidine Negative Cutoff=25 ng/mL   Methadone Screen, Urine Negative Cutoff=300 ng/mL   Propoxyphene Negative Cutoff=300 ng/mL   Meperidine Negative Cutoff=200 ng/mL   Tramadol Negative Cutoff=200 ng/mL   Creatinine 21.3 20.0 - 300.0 mg/dL   pH, Urine 6.1 4.5 - 8.9  Drug Profile [id number]  Result Value Ref Range   Amphetamines Positive (A) Cutoff=1000   Amphetamine Positive (A)    Amphetamine GC/MS Conf 2,074 Cutoff=500 ng/mL   Methamphetamine Negative Cutoff=500      Assessment & Plan:   Problem List Items Addressed This Visit       Other   Depression   Insomnia - Primary   Attention deficit hyperactivity disorder      Follow up plan: No follow-ups on file.

## 2021-08-12 ENCOUNTER — Other Ambulatory Visit: Payer: Self-pay

## 2021-08-12 ENCOUNTER — Encounter: Payer: Self-pay | Admitting: Nurse Practitioner

## 2021-08-12 ENCOUNTER — Ambulatory Visit: Payer: BC Managed Care – PPO | Admitting: Nurse Practitioner

## 2021-08-12 VITALS — BP 130/87 | HR 79 | Temp 98.8°F | Wt 222.6 lb

## 2021-08-12 DIAGNOSIS — F909 Attention-deficit hyperactivity disorder, unspecified type: Secondary | ICD-10-CM | POA: Diagnosis not present

## 2021-08-12 DIAGNOSIS — H6983 Other specified disorders of Eustachian tube, bilateral: Secondary | ICD-10-CM | POA: Diagnosis not present

## 2021-08-12 DIAGNOSIS — F419 Anxiety disorder, unspecified: Secondary | ICD-10-CM | POA: Diagnosis not present

## 2021-08-12 DIAGNOSIS — H6993 Unspecified Eustachian tube disorder, bilateral: Secondary | ICD-10-CM

## 2021-08-12 MED ORDER — PREDNISONE 10 MG PO TABS: 10.0000 mg | ORAL_TABLET | Freq: Every day | ORAL | 0 refills | Status: DC

## 2021-08-12 NOTE — Assessment & Plan Note (Signed)
Chronic.  Under good control on current regimen of Vyvanse 50mg  daily.  Controlled substance agreement and UDS up to date.  Continue current regimen. Continue to monitor. Call with any concerns. Refills given for 3 months. Follow up 3 months.

## 2021-08-12 NOTE — Assessment & Plan Note (Signed)
Chronic.  Controlled.  Continue with current medication regimen.  Labs ordered today.  Refills already sent.  Controlled substance agreement and UDS up to date.  Return to clinic in 3 months for reevaluation.  Call sooner if concerns arise.

## 2021-08-12 NOTE — Progress Notes (Signed)
BP 130/87   Pulse 79   Temp 98.8 F (37.1 C) (Oral)   Wt 222 lb 9.6 oz (101 kg)   SpO2 99%   BMI 37.04 kg/m    Subjective:    Patient ID: Tammy Mays, female    DOB: 04/10/1976, 45 y.o.   MRN: [id number]  HPI: Tammy Mays is a 45 y.o. female  Chief Complaint  Patient presents with   Medication Refill   Flu Vaccine    Patient states she will probably wait until next visit she will get her flu vaccine. Patient states they have a health fair at their job and she will probably wait until then.    Ear Problem    Patient states she it feels as if there is fluid trapped behind her ears and she says still takes her allergy medication and nasal sprays. Patient states she may need to be referred to an ENT. Patient states it started off as ear infections back a few years ago.      ADHD FOLLOW UP ADHD status: stable Satisfied with current therapy: yes Medication compliance:  excellent compliance Controlled substance contract: yes Previous psychiatry evaluation: no Previous medications: no vyvanse (lisdexamfethamine)   Taking meds on weekends/vacations: yes Work/school performance:  good Difficulty sustaining attention/completing tasks: no Distracted by extraneous stimuli: no Does not listen when spoken to: no  Fidgets with hands or feet: no Unable to stay in seat: no Blurts out/interrupts others: no ADHD Medication Side Effects: no    Decreased appetite: no    Headache: no    Sleeping disturbance pattern: no    Irritability: no    Rebound effects (worse than baseline) off medication: no    Anxiousness: no    Dizziness: no    Tics: no  ANXIETY/STRESS Duration:controlled- denies concerns regarding anxiety today.  Anxious mood: yes  Excessive worrying: yes Irritability: no  Sweating: no Nausea: no Palpitations:no Hyperventilation: no Panic attacks: no Agoraphobia: no  Obscessions/compulsions: no Depressed mood: no Depression screen [facility] 2/9 11/26/2020 05/15/2020  04/02/2020 02/06/2020 09/30/2019  Decreased Interest 0 0 0 0 0  Down, Depressed, Hopeless 0 0 0 0 0  PHQ - 2 Score 0 0 0 0 0  Altered sleeping 0 2 2 1  0  Tired, decreased energy 0 0 0 0 0  Change in appetite 0 0 0 0 0  Feeling bad or failure about yourself  0 0 0 0 0  Trouble concentrating 0 0 0 0 0  Moving slowly or fidgety/restless 0 0 0 0 0  Suicidal thoughts 0 0 0 0 0  PHQ-9 Score 0 2 2 1  0  Difficult doing work/chores Not difficult at all - - - -  Some recent data might be hidden   Anhedonia: no Weight changes: no Insomnia: no hard to stay asleep  Hypersomnia: no Fatigue/loss of energy: yes Feelings of worthlessness: no Feelings of guilt: no Impaired concentration/indecisiveness: no Suicidal ideations: no  Crying spells: no Recent Stressors/Life Changes: no   Relationship problems: no   Family stress: no     Financial stress: no    Job stress: no    Recent death/loss: no  Relevant past medical, surgical, family and social history reviewed and updated as indicated. Interim medical history since our last visit reviewed. Allergies and medications reviewed and updated.  Review of Systems  Eyes:  Negative for visual disturbance.  Respiratory:  Negative for cough, chest tightness and shortness of breath.   Cardiovascular:  Negative  for chest pain, palpitations and leg swelling.  Neurological:  Negative for dizziness and headaches.  Psychiatric/Behavioral:  Positive for decreased concentration. The patient is nervous/anxious.    Per HPI unless specifically indicated above     Objective:    BP 130/87   Pulse 79   Temp 98.8 F (37.1 C) (Oral)   Wt 222 lb 9.6 oz (101 kg)   SpO2 99%   BMI 37.04 kg/m   Wt Readings from Last 3 Encounters:  08/12/21 222 lb 9.6 oz (101 kg)  04/14/21 210 lb (95.3 kg)  03/30/21 210 lb (95.3 kg)    Physical Exam Vitals and nursing note reviewed.  Constitutional:      General: She is not in acute distress.    Appearance: Normal  appearance. She is normal weight. She is not ill-appearing, toxic-appearing or diaphoretic.  HENT:     Head: Normocephalic.     Right Ear: External ear normal. No tenderness. A middle ear effusion is present. Tympanic membrane is not erythematous.     Left Ear: External ear normal. No tenderness. A middle ear effusion is present. Tympanic membrane is not erythematous.     Nose: Nose normal.     Mouth/Throat:     Mouth: Mucous membranes are moist.     Pharynx: Oropharynx is clear.  Eyes:     General:        Right eye: No discharge.        Left eye: No discharge.     Extraocular Movements: Extraocular movements intact.     Conjunctiva/sclera: Conjunctivae normal.     Pupils: Pupils are equal, round, and reactive to light.  Cardiovascular:     Rate and Rhythm: Normal rate and regular rhythm.     Heart sounds: No murmur heard. Pulmonary:     Effort: Pulmonary effort is normal. No respiratory distress.     Breath sounds: Normal breath sounds. No wheezing or rales.  Musculoskeletal:     Cervical back: Normal range of motion and neck supple.  Skin:    General: Skin is warm and dry.     Capillary Refill: Capillary refill takes less than 2 seconds.  Neurological:     General: No focal deficit present.     Mental Status: She is alert and oriented to person, place, and time. Mental status is at baseline.  Psychiatric:        Mood and Affect: Mood normal.        Behavior: Behavior normal.        Thought Content: Thought content normal.        Judgment: Judgment normal.    Results for orders placed or performed in visit on 02/15/21  [id number] 11+Oxyco+Alc+Crt-Bund  Result Value Ref Range   Ethanol Negative Cutoff=0.020 %   Amphetamines, Urine See Final Results Cutoff=1000 ng/mL   Barbiturate Negative Cutoff=200 ng/mL   BENZODIAZ UR QL Negative Cutoff=200 ng/mL   Cannabinoid Quant, Ur Negative Cutoff=50 ng/mL   Cocaine (Metabolite) Negative Cutoff=300 ng/mL   OPIATE SCREEN URINE Negative  Cutoff=300 ng/mL   Oxycodone/Oxymorphone, Urine Negative Cutoff=300 ng/mL   Phencyclidine Negative Cutoff=25 ng/mL   Methadone Screen, Urine Negative Cutoff=300 ng/mL   Propoxyphene Negative Cutoff=300 ng/mL   Meperidine Negative Cutoff=200 ng/mL   Tramadol Negative Cutoff=200 ng/mL   Creatinine 21.3 20.0 - 300.0 mg/dL   pH, Urine 6.1 4.5 - 8.9  Drug Profile [id number]  Result Value Ref Range   Amphetamines Positive (A) Cutoff=1000   Amphetamine Positive (A)  Amphetamine GC/MS Conf 2,074 Cutoff=500 ng/mL   Methamphetamine Negative Cutoff=500      Assessment & Plan:   Problem List Items Addressed This Visit       Other   Attention deficit hyperactivity disorder - Primary    Chronic.  Under good control on current regimen of Vyvanse 50mg  daily.  Controlled substance agreement and UDS up to date.  Continue current regimen. Continue to monitor. Call with any concerns. Refills given for 3 months. Follow up 3 months.        Anxiety    Chronic.  Controlled.  Continue with current medication regimen.  Labs ordered today.  Refills already sent.  Controlled substance agreement and UDS up to date.  Return to clinic in 3 months for reevaluation.  Call sooner if concerns arise.        Other Visit Diagnoses     Dysfunction of both eustachian tubes       Prednisone given to help patient with symptoms.  Referral placed for patient to have evaluation with ENT.   Relevant Orders   Ambulatory referral to ENT        Follow up plan: Return in about 3 months (around 11/12/2021) for ADHD FU.

## 2021-08-13 MED ORDER — PREDNISONE 10 MG PO TABS: 10.0000 mg | ORAL_TABLET | Freq: Every day | ORAL | 0 refills | Status: DC

## 2021-08-13 NOTE — Addendum Note (Signed)
Addended by: Larae Grooms on: 08/13/2021 03:52 PM   Modules accepted: Orders

## 2021-08-27 NOTE — Telephone Encounter (Signed)
Visit marked erroneous encounter. Mirena not rcvd

## 2021-09-02 ENCOUNTER — Other Ambulatory Visit: Payer: Self-pay

## 2021-09-02 MED ORDER — LORAZEPAM 1 MG PO TABS: ORAL_TABLET | ORAL | 0 refills | Status: DC

## 2021-09-02 MED ORDER — LISDEXAMFETAMINE DIMESYLATE 50 MG PO CAPS: 50.0000 mg | ORAL_CAPSULE | Freq: Every day | ORAL | 0 refills | Status: DC

## 2021-09-21 ENCOUNTER — Telehealth: Payer: Self-pay | Admitting: Obstetrics and Gynecology

## 2021-09-21 NOTE — Telephone Encounter (Signed)
Pt is scheduled for Mirena removal and reinsertion with CRS on 12/16.

## 2021-09-29 NOTE — Telephone Encounter (Signed)
Noted. Will order to arrive by apt date/time. 

## 2021-10-02 ENCOUNTER — Other Ambulatory Visit: Payer: Self-pay | Admitting: Nurse Practitioner

## 2021-10-02 ENCOUNTER — Encounter: Payer: Self-pay | Admitting: Nurse Practitioner

## 2021-10-04 MED ORDER — LISDEXAMFETAMINE DIMESYLATE 50 MG PO CAPS: 50.0000 mg | ORAL_CAPSULE | Freq: Every day | ORAL | 0 refills | Status: DC

## 2021-10-04 MED ORDER — LORAZEPAM 1 MG PO TABS: ORAL_TABLET | ORAL | 0 refills | Status: DC

## 2021-10-15 ENCOUNTER — Telehealth: Payer: BC Managed Care – PPO | Admitting: Nurse Practitioner

## 2021-10-15 ENCOUNTER — Encounter: Payer: Self-pay | Admitting: Nurse Practitioner

## 2021-10-15 ENCOUNTER — Ambulatory Visit: Payer: PRIVATE HEALTH INSURANCE | Admitting: Obstetrics and Gynecology

## 2021-10-15 DIAGNOSIS — Z20828 Contact with and (suspected) exposure to other viral communicable diseases: Secondary | ICD-10-CM | POA: Diagnosis not present

## 2021-10-15 DIAGNOSIS — R6889 Other general symptoms and signs: Secondary | ICD-10-CM

## 2021-10-15 MED ORDER — OSELTAMIVIR PHOSPHATE 75 MG PO CAPS: 75.0000 mg | ORAL_CAPSULE | Freq: Two times a day (BID) | ORAL | 0 refills | Status: DC

## 2021-10-15 NOTE — Progress Notes (Signed)
Virtual Visit Consent   Tammy Mays, you are scheduled for a virtual visit with Tammy Daphine Deutscher, FNP, a [facility] provider, today.     Just as with appointments in the office, your consent must be obtained to participate.  Your consent will be active for this visit and any virtual visit you may have with one of our providers in the next 365 days.     If you have a MyChart account, a copy of this consent can be sent to you electronically.  All virtual visits are billed to your insurance company just like a traditional visit in the office.    As this is a virtual visit, video technology does not allow for your provider to perform a traditional examination.  This may limit your provider's ability to fully assess your condition.  If your provider identifies any concerns that need to be evaluated in person or the need to arrange testing (such as labs, EKG, etc.), we will make arrangements to do so.     Although advances in technology are sophisticated, we cannot ensure that it will always work on either your end or our end.  If the connection with a video visit is poor, the visit may have to be switched to a telephone visit.  With either a video or telephone visit, we are not always able to ensure that we have a secure connection.     I need to obtain your verbal consent now.   Are you willing to proceed with your visit today? YES   Tammy Mays has provided verbal consent on 10/15/2021 for a virtual visit (video or telephone).   Tammy Daphine Deutscher, FNP   Date: 10/15/2021 7:33 PM   Virtual Visit via Video Note   I, Tammy Mays, connected with Tammy Mays ([id number], 04/15/1976) on 10/15/21 at  7:30 PM EST by a video-enabled telemedicine application and verified that I am speaking with the correct person using two identifiers.  Location: Patient: Virtual Visit Location Patient: Home Provider: Virtual Visit Location Provider: Mobile   I discussed the limitations of  evaluation and management by telemedicine and the availability of in person appointments. The patient expressed understanding and agreed to proceed.    History of Present Illness: Tammy Mays is a 45 y.o. who identifies as a female who was assigned female at birth, and is being seen today for flu like symptoms.  HPI: Patient developed body aches and fever around 10 o clock this morning. They have an outbreak of flu where she works. She has been taking tylenol which helped with fever.   Review of Systems  Constitutional:  Positive for chills, fever and malaise/fatigue.  HENT:  Positive for congestion and sore throat.   Respiratory:  Positive for cough and sputum production.   Musculoskeletal:  Positive for myalgias.  Neurological:  Positive for headaches.   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   Plantar fasciitis, bilateral 01/26/2016   Elevated blood pressure, situational 09/30/2015   Attention deficit hyperactivity disorder 06/29/2015   Insomnia     Allergies:  Allergies  Allergen Reactions   Effexor [Venlafaxine]    Medications:  Current Outpatient Medications:    azelastine (ASTELIN) 0.1 % nasal spray, USE 1 TO 2 SPRAYS IN EACH NOSTRIL TWICE DAILY, Disp: 30 mL, Rfl: 11   cetirizine (ZYRTEC) 10 MG tablet, TAKE 1 TABLET(10 MG) BY MOUTH TWICE DAILY, Disp: 180  tablet, Rfl: 3   fexofenadine (ALLEGRA ALLERGY) 180 MG tablet, Take 1 tablet (180 mg total) by mouth daily., Disp: 90 tablet, Rfl: 1   fluticasone (FLONASE) 50 MCG/ACT nasal spray, SHAKE LIQUID AND USE 2 SPRAYS IN EACH NOSTRIL EVERY DAY, Disp: 16 g, Rfl: 11   ibuprofen (ADVIL) 800 MG tablet, TAKE 1 TABLET(800 MG) BY MOUTH THREE TIMES DAILY, Disp: 90 tablet, Rfl: 0   lisdexamfetamine (VYVANSE) 50 MG capsule, Take 1 capsule (50 mg total) by mouth daily., Disp: 30 capsule, Rfl: 0   lisdexamfetamine (VYVANSE) 50 MG capsule, Take 1 capsule (50 mg  total) by mouth daily., Disp: 30 capsule, Rfl: 0   lisdexamfetamine (VYVANSE) 50 MG capsule, Take 1 capsule (50 mg total) by mouth daily., Disp: 30 capsule, Rfl: 0   LORazepam (ATIVAN) 1 MG tablet, TAKE 1 TABLET(1 MG) BY MOUTH DAILY AS NEEDED FOR ANXIETY, Disp: 30 tablet, Rfl: 0   montelukast (SINGULAIR) 10 MG tablet, TAKE 1 TABLET(10 MG) BY MOUTH AT BEDTIME, Disp: 30 tablet, Rfl: 3   predniSONE (DELTASONE) 10 MG tablet, Take 1 tablet (10 mg total) by mouth daily with breakfast. Take 1 tablet (10 mg total) by mouth daily with breakfast. Take 6 tabs for 3 days, 5 tabs for 3 days, and decrease by 1 tab every 3 days until course is complete., Disp: 63 tablet, Rfl: 0  Observations/Objective: Patient is well-developed, well-nourished in no acute distress.  Resting comfortably  at home.  Head is normocephalic, atraumatic.  No labored breathing.  Speech is clear and coherent with logical content.  Patient is alert and oriented at baseline.  Raspy voice Deep dry cough  Assessment and Plan:  Tammy Mays in today with chief complaint of No chief complaint on file.   1. Flu-like symptoms 2. Exposure to the flu 1. Take meds as prescribed 2. Use a cool mist humidifier especially during the winter months and when heat has been humid. 3. Use saline nose sprays frequently 4. Saline irrigations of the nose can be very helpful if done frequently.  * 4X daily for 1 week*  * Use of a nettie pot can be helpful with this. Follow directions with this* 5. Drink plenty of fluids 6. Keep thermostat turn down low 7.For any cough or congestion- tessalon perles 8. For fever or aces or pains- take tylenol or ibuprofen appropriate for age and weight.  * for fevers greater than 101 orally you may alternate ibuprofen and tylenol every  3 hours.   Meds ordered this encounter  Medications   oseltamivir (TAMIFLU) 75 MG capsule    Sig: Take 1 capsule (75 mg total) by mouth 2 (two) times daily.    Dispense:  10  capsule    Refill:  0    Order Specific Question:   Supervising Provider    Answer:   Eber Hong [[id number]]      Follow Up Instructions: I discussed the assessment and treatment plan with the patient. The patient was provided an opportunity to ask questions and all were answered. The patient agreed with the plan and demonstrated an understanding of the instructions.  A copy of instructions were sent to the patient via MyChart.  The patient was advised to call back or seek an in-person evaluation if the symptoms worsen or if the condition fails to improve as anticipated.  Time:  I spent 9 minutes with the patient via telehealth technology discussing the above problems/concerns.    Tifani-Margaret Daphine Deutscher, FNP

## 2021-10-19 NOTE — Telephone Encounter (Signed)
Per Epic apt r/s for 11/09/2021.

## 2021-10-27 ENCOUNTER — Telehealth (INDEPENDENT_AMBULATORY_CARE_PROVIDER_SITE_OTHER): Payer: BC Managed Care – PPO | Admitting: Internal Medicine

## 2021-10-27 ENCOUNTER — Telehealth: Payer: Self-pay | Admitting: Nurse Practitioner

## 2021-10-27 ENCOUNTER — Encounter: Payer: Self-pay | Admitting: Internal Medicine

## 2021-10-27 DIAGNOSIS — J019 Acute sinusitis, unspecified: Secondary | ICD-10-CM | POA: Insufficient documentation

## 2021-10-27 DIAGNOSIS — J329 Chronic sinusitis, unspecified: Secondary | ICD-10-CM | POA: Diagnosis not present

## 2021-10-27 DIAGNOSIS — H6983 Other specified disorders of Eustachian tube, bilateral: Secondary | ICD-10-CM | POA: Diagnosis not present

## 2021-10-27 DIAGNOSIS — R051 Acute cough: Secondary | ICD-10-CM | POA: Diagnosis not present

## 2021-10-27 DIAGNOSIS — J301 Allergic rhinitis due to pollen: Secondary | ICD-10-CM | POA: Diagnosis not present

## 2021-10-27 MED ORDER — HYDROCOD POLST-CPM POLST ER 10-8 MG/5ML PO SUER: 5.0000 mL | Freq: Every evening | ORAL | 0 refills | Status: DC | PRN

## 2021-10-27 NOTE — Telephone Encounter (Signed)
Chief Complaint: Cough Symptoms: Non-productive since having the flu Frequency: Night Pertinent Negatives: Patient denies N/A Disposition: [] ED /[] Urgent Care (no appt availability in office) / [x] Appointment(In office/virtual)/ []  [place] [facility]/ [] Home Care/ [] Refused Recommended Disposition  Additional Notes: Requesting Tussionex refill that was prescribed in 2018 when she had the flu. Advised she will need an appointment.

## 2021-10-27 NOTE — Telephone Encounter (Signed)
Routing to provider to advise. Last RX for this before today was in 2018 in chart review.

## 2021-10-27 NOTE — Telephone Encounter (Signed)
Copied from CRM [id number]. Topic: General - Call Back - No Documentation >> Oct 27, 2021  1:31 PM Randol Kern wrote: Reason for CRM: Pt called and is requesting for the office to upload her history of using chlorpheniramine-HYDROcodone (TUSSIONEX PENNKINETIC ER) 10-8 MG/5ML SURE, she took some of an old prescription that she had and needs to have proof for her job that she was prescribed this before. Please advise, and upload via mychart. Or the patient is willing to come to the office and pick up a printed copy.  Best contact: [phone]

## 2021-10-27 NOTE — Telephone Encounter (Signed)
Pt called reporting that she has flu symptoms. She is requesting chlorpheniramine-HYDROcodone Stevphen Meuse [facility] ER) 10-8 MG/5ML SURE [id number] [place], [place] - [address] AT [facility] [address]  [address] [place] [address]  Phone: [phone] Fax: [phone]

## 2021-10-27 NOTE — Progress Notes (Signed)
There were no vitals taken for this visit.   Subjective:    Patient ID: Tammy Mays, female    DOB: [birth year], 45 y.o.   MRN: [id number]  Chief Complaint  Patient presents with   Cough     Has been coughing at night since having the flu and has been keeping her up at night and  would like to have a refill of Tussinex. Seen ENT today was given an antibiotic and prednisone.     HPI: Tammy Mays is a 45 y.o. female  Cough This is a new (had flu in 2018 has a residual cough sec to the flu - seen an ENT today and says waking up in the am and took tamiflu last week.) problem. Episode onset: taking psuedophed has a sinus infection and was rx abx and prednisone. is getting allergy testing. Progression since onset: no congestion isnt able to sleep at night sec to cough.   Chief Complaint  Patient presents with   Cough     Has been coughing at night since having the flu and has been keeping her up at night and  would like to have a refill of Tussinex. Seen ENT today was given an antibiotic and prednisone.     Relevant past medical, surgical, family and social history reviewed and updated as indicated. Interim medical history since our last visit reviewed. Allergies and medications reviewed and updated.  Review of Systems  Respiratory:  Positive for cough.    Per HPI unless specifically indicated above     Objective:    There were no vitals taken for this visit.  Wt Readings from Last 3 Encounters:  08/12/21 222 lb 9.6 oz (101 kg)  04/14/21 210 lb (95.3 kg)  03/30/21 210 lb (95.3 kg)    Physical Exam  Unable to peform sec to virtual visit.   Results for orders placed or performed in visit on 02/15/21  [id number] 11+Oxyco+Alc+Crt-Bund  Result Value Ref Range   Ethanol Negative Cutoff=0.020 %   Amphetamines, Urine See Final Results Cutoff=1000 ng/mL   Barbiturate Negative Cutoff=200 ng/mL   BENZODIAZ UR QL Negative Cutoff=200 ng/mL   Cannabinoid Quant, Ur Negative Cutoff=50  ng/mL   Cocaine (Metabolite) Negative Cutoff=300 ng/mL   OPIATE SCREEN URINE Negative Cutoff=300 ng/mL   Oxycodone/Oxymorphone, Urine Negative Cutoff=300 ng/mL   Phencyclidine Negative Cutoff=25 ng/mL   Methadone Screen, Urine Negative Cutoff=300 ng/mL   Propoxyphene Negative Cutoff=300 ng/mL   Meperidine Negative Cutoff=200 ng/mL   Tramadol Negative Cutoff=200 ng/mL   Creatinine 21.3 20.0 - 300.0 mg/dL   pH, Urine 6.1 4.5 - 8.9  Drug Profile [phone]  Result Value Ref Range   Amphetamines Positive (A) Cutoff=1000   Amphetamine Positive (A)    Amphetamine GC/MS Conf 2,074 Cutoff=500 ng/mL   Methamphetamine Negative Cutoff=500        Current Outpatient Medications:    azelastine (ASTELIN) 0.1 % nasal spray, USE 1 TO 2 SPRAYS IN EACH NOSTRIL TWICE DAILY, Disp: 30 mL, Rfl: 11   cetirizine (ZYRTEC) 10 MG tablet, TAKE 1 TABLET(10 MG) BY MOUTH TWICE DAILY, Disp: 180 tablet, Rfl: 3   fexofenadine (ALLEGRA ALLERGY) 180 MG tablet, Take 1 tablet (180 mg total) by mouth daily., Disp: 90 tablet, Rfl: 1   fluticasone (FLONASE) 50 MCG/ACT nasal spray, SHAKE LIQUID AND USE 2 SPRAYS IN EACH NOSTRIL EVERY DAY, Disp: 16 g, Rfl: 11   ibuprofen (ADVIL) 800 MG tablet, TAKE 1 TABLET(800 MG) BY MOUTH THREE TIMES DAILY, Disp: 90  tablet, Rfl: 0   lisdexamfetamine (VYVANSE) 50 MG capsule, Take 1 capsule (50 mg total) by mouth daily., Disp: 30 capsule, Rfl: 0   LORazepam (ATIVAN) 1 MG tablet, TAKE 1 TABLET(1 MG) BY MOUTH DAILY AS NEEDED FOR ANXIETY, Disp: 30 tablet, Rfl: 0   montelukast (SINGULAIR) 10 MG tablet, TAKE 1 TABLET(10 MG) BY MOUTH AT BEDTIME, Disp: 30 tablet, Rfl: 3   predniSONE (DELTASONE) 10 MG tablet, Take 1 tablet (10 mg total) by mouth daily with breakfast. Take 1 tablet (10 mg total) by mouth daily with breakfast. Take 6 tabs for 3 days, 5 tabs for 3 days, and decrease by 1 tab every 3 days until course is complete., Disp: 63 tablet, Rfl: 0   cefdinir (OMNICEF) 300 MG capsule, Take by mouth.,  Disp: , Rfl:    lisdexamfetamine (VYVANSE) 50 MG capsule, Take 1 capsule (50 mg total) by mouth daily., Disp: 30 capsule, Rfl: 0   lisdexamfetamine (VYVANSE) 50 MG capsule, Take 1 capsule (50 mg total) by mouth daily., Disp: 30 capsule, Rfl: 0    Assessment & Plan:  Ac sinusitis patient would like a refill on her Tussionex which helped her in the past with her sleeping she is unable to sleep secondary to her cough she had the flu last week as in HPI pt advised to take Tylenol q 4- 6 hourly as needed. pt to take allegra q pm as needed and to call office if symptoms worsened pt verbalised understanding of such.     Problem List Items Addressed This Visit   None    No orders of the defined types were placed in this encounter.    No orders of the defined types were placed in this encounter.    Follow up plan: No follow-ups on file. This visit was completed via video visit through MyChart due to the restrictions of the COVID-19 pandemic. All issues as above were discussed and addressed. Physical exam was done as above through visual confirmation on video through MyChart. If it was felt that the patient should be evaluated in the office, they were directed there. The patient verbally consented to this visit. Location of the patient: home Location of the provider: work Those involved with this call:  Provider: Loura Pardon, MD CMA: Tristan Schroeder, CMA Front Desk/Registration: [employer] ime spent on call:  10 minutes with patient face to face via video conference. More than 50% of this time was spent in counseling and coordination of care. 10 minutes total spent in review of patient's record and preparation of their chart.

## 2021-10-28 NOTE — Telephone Encounter (Signed)
Patient seen yesterday for an appt.

## 2021-11-04 ENCOUNTER — Other Ambulatory Visit: Payer: Self-pay | Admitting: Nurse Practitioner

## 2021-11-05 MED ORDER — LISDEXAMFETAMINE DIMESYLATE 50 MG PO CAPS: 50.0000 mg | ORAL_CAPSULE | Freq: Every day | ORAL | 0 refills | Status: DC

## 2021-11-05 MED ORDER — LORAZEPAM 1 MG PO TABS: ORAL_TABLET | ORAL | 0 refills | Status: DC

## 2021-11-08 ENCOUNTER — Encounter (INDEPENDENT_AMBULATORY_CARE_PROVIDER_SITE_OTHER): Payer: Self-pay

## 2021-11-09 ENCOUNTER — Ambulatory Visit: Payer: PRIVATE HEALTH INSURANCE | Admitting: Obstetrics and Gynecology

## 2021-11-10 NOTE — Progress Notes (Signed)
BP 120/82    Pulse 79    Temp 98.1 F (36.7 C) (Oral)    Wt 218 lb 6.4 oz (99.1 kg)    SpO2 98%    BMI 36.34 kg/m    Subjective:    Patient ID: Tammy Mays, female    DOB: 04/16/1976, 46 y.o.   MRN: [id number]  HPI: Tammy Mays is a 46 y.o. female  Chief Complaint  Patient presents with   ADHD     ADHD FOLLOW UP ADHD status: stable Satisfied with current therapy: yes Medication compliance:  excellent compliance Controlled substance contract: yes Previous psychiatry evaluation: no Previous medications: no vyvanse (lisdexamfethamine)   Taking meds on weekends/vacations: yes Work/school performance:  good Difficulty sustaining attention/completing tasks: no Distracted by extraneous stimuli: no Does not listen when spoken to: no  Fidgets with hands or feet: no Unable to stay in seat: no Blurts out/interrupts others: no ADHD Medication Side Effects: no    Decreased appetite: no    Headache: no    Sleeping disturbance pattern: no    Irritability: no    Rebound effects (worse than baseline) off medication: no    Anxiousness: no    Dizziness: no    Tics: no  ANXIETY/STRESS Duration:controlled- denies concerns regarding anxiety today.  Anxious mood: yes  Excessive worrying: yes Irritability: no  Sweating: no Nausea: no Palpitations:no Hyperventilation: no Panic attacks: no Agoraphobia: no  Obscessions/compulsions: no Depressed mood: no Depression screen [facility] 2/9 11/11/2021 11/26/2020 05/15/2020 04/02/2020 02/06/2020  Decreased Interest 0 0 0 0 0  Down, Depressed, Hopeless 0 0 0 0 0  PHQ - 2 Score 0 0 0 0 0  Altered sleeping 3 0 2 2 1   Tired, decreased energy 1 0 0 0 0  Change in appetite 1 0 0 0 0  Feeling bad or failure about yourself  0 0 0 0 0  Trouble concentrating 0 0 0 0 0  Moving slowly or fidgety/restless 0 0 0 0 0  Suicidal thoughts 0 0 0 0 0  PHQ-9 Score 5 0 2 2 1   Difficult doing work/chores Somewhat difficult Not difficult at all - - -  Some recent  data might be hidden   Anhedonia: no Weight changes: no Insomnia: no hard to stay asleep  Hypersomnia: no Fatigue/loss of energy: yes Feelings of worthlessness: no Feelings of guilt: no Impaired concentration/indecisiveness: no Suicidal ideations: no  Crying spells: no Recent Stressors/Life Changes: no   Relationship problems: no   Family stress: no     Financial stress: no    Job stress: no    Recent death/loss: no  Patient states she was treated for a sinus infection recently. Finished her antibiotics on Sunday.  Still having pressure and symptoms.   Relevant past medical, surgical, family and social history reviewed and updated as indicated. Interim medical history since our last visit reviewed. Allergies and medications reviewed and updated.  Review of Systems  Eyes:  Negative for visual disturbance.  Respiratory:  Negative for cough, chest tightness and shortness of breath.   Cardiovascular:  Negative for chest pain, palpitations and leg swelling.  Neurological:  Negative for dizziness and headaches.  Psychiatric/Behavioral:  Positive for decreased concentration. The patient is nervous/anxious.    Per HPI unless specifically indicated above     Objective:    BP 120/82    Pulse 79    Temp 98.1 F (36.7 C) (Oral)    Wt 218 lb 6.4 oz (99.1 kg)  SpO2 98%    BMI 36.34 kg/m   Wt Readings from Last 3 Encounters:  11/11/21 218 lb 6.4 oz (99.1 kg)  08/12/21 222 lb 9.6 oz (101 kg)  04/14/21 210 lb (95.3 kg)    Physical Exam Vitals and nursing note reviewed.  Constitutional:      General: She is not in acute distress.    Appearance: Normal appearance. She is normal weight. She is not ill-appearing, toxic-appearing or diaphoretic.  HENT:     Head: Normocephalic.     Right Ear: External ear normal. No tenderness. A middle ear effusion is present. Tympanic membrane is not erythematous.     Left Ear: External ear normal. No tenderness. A middle ear effusion is present.  Tympanic membrane is not erythematous.     Nose: Nose normal.     Mouth/Throat:     Mouth: Mucous membranes are moist.     Pharynx: Oropharynx is clear.  Eyes:     General:        Right eye: No discharge.        Left eye: No discharge.     Extraocular Movements: Extraocular movements intact.     Conjunctiva/sclera: Conjunctivae normal.     Pupils: Pupils are equal, round, and reactive to light.  Cardiovascular:     Rate and Rhythm: Normal rate and regular rhythm.     Heart sounds: No murmur heard. Pulmonary:     Effort: Pulmonary effort is normal. No respiratory distress.     Breath sounds: Normal breath sounds. No wheezing or rales.  Musculoskeletal:     Cervical back: Normal range of motion and neck supple.  Skin:    General: Skin is warm and dry.     Capillary Refill: Capillary refill takes less than 2 seconds.  Neurological:     General: No focal deficit present.     Mental Status: She is alert and oriented to person, place, and time. Mental status is at baseline.  Psychiatric:        Mood and Affect: Mood normal.        Behavior: Behavior normal.        Thought Content: Thought content normal.        Judgment: Judgment normal.    Results for orders placed or performed in visit on 02/15/21  [id number] 11+Oxyco+Alc+Crt-Bund  Result Value Ref Range   Ethanol Negative Cutoff=0.020 %   Amphetamines, Urine See Final Results Cutoff=1000 ng/mL   Barbiturate Negative Cutoff=200 ng/mL   BENZODIAZ UR QL Negative Cutoff=200 ng/mL   Cannabinoid Quant, Ur Negative Cutoff=50 ng/mL   Cocaine (Metabolite) Negative Cutoff=300 ng/mL   OPIATE SCREEN URINE Negative Cutoff=300 ng/mL   Oxycodone/Oxymorphone, Urine Negative Cutoff=300 ng/mL   Phencyclidine Negative Cutoff=25 ng/mL   Methadone Screen, Urine Negative Cutoff=300 ng/mL   Propoxyphene Negative Cutoff=300 ng/mL   Meperidine Negative Cutoff=200 ng/mL   Tramadol Negative Cutoff=200 ng/mL   Creatinine 21.3 20.0 - 300.0 mg/dL   pH,  Urine 6.1 4.5 - 8.9  Drug Profile 312 [id number] [phone]  Result Value Ref Range   Amphetamines Positive (A) Cutoff=1000   Amphetamine Positive (A)    Amphetamine GC/MS Conf 2,074 Cutoff=500 ng/mL   Methamphetamine Negative Cutoff=500      Assessment & Plan:   Problem List Items Addressed This Visit       Respiratory   Acute non-recurrent sinusitis   Relevant Medications   doxycycline (VIBRA-TABS) 100 MG tablet     Other   Attention deficit hyperactivity disorder  Chronic.  Under good control on current regimen of Vyvanse 50mg  daily.  Controlled substance agreement and UDS up to date.  Continue current regimen. Continue to monitor. Call with any concerns.  Not due for refills today.  Follow up 3 months.        Anxiety - Primary    Chronic.  Controlled.  Continue with current medication regimen.  Not due for refills today.   Controlled substance agreement and UDS up to date.  Return to clinic in 3 months for reevaluation.  Call sooner if concerns arise.          Follow up plan: Return in about 3 months (around 02/09/2022) for ADHD FU.

## 2021-11-11 ENCOUNTER — Other Ambulatory Visit: Payer: Self-pay

## 2021-11-11 ENCOUNTER — Ambulatory Visit: Payer: BC Managed Care – PPO | Admitting: Nurse Practitioner

## 2021-11-11 ENCOUNTER — Encounter: Payer: Self-pay | Admitting: Nurse Practitioner

## 2021-11-11 VITALS — BP 120/82 | HR 79 | Temp 98.1°F | Wt 218.4 lb

## 2021-11-11 DIAGNOSIS — J0110 Acute frontal sinusitis, unspecified: Secondary | ICD-10-CM | POA: Diagnosis not present

## 2021-11-11 DIAGNOSIS — F909 Attention-deficit hyperactivity disorder, unspecified type: Secondary | ICD-10-CM

## 2021-11-11 DIAGNOSIS — F419 Anxiety disorder, unspecified: Secondary | ICD-10-CM | POA: Diagnosis not present

## 2021-11-11 MED ORDER — DOXYCYCLINE HYCLATE 100 MG PO TABS: 100.0000 mg | ORAL_TABLET | Freq: Two times a day (BID) | ORAL | 0 refills | Status: DC

## 2021-11-11 NOTE — Assessment & Plan Note (Signed)
Chronic.  Controlled.  Continue with current medication regimen.  Not due for refills today.   Controlled substance agreement and UDS up to date.  Return to clinic in 3 months for reevaluation.  Call sooner if concerns arise.

## 2021-11-11 NOTE — Assessment & Plan Note (Signed)
Chronic.  Under good control on current regimen of Vyvanse 50mg  daily.  Controlled substance agreement and UDS up to date.  Continue current regimen. Continue to monitor. Call with any concerns.  Not due for refills today.  Follow up 3 months.

## 2021-11-12 NOTE — Telephone Encounter (Signed)
Per Epic r/s to 12/09/2021

## 2021-11-22 DIAGNOSIS — J301 Allergic rhinitis due to pollen: Secondary | ICD-10-CM | POA: Diagnosis not present

## 2021-11-22 DIAGNOSIS — J305 Allergic rhinitis due to food: Secondary | ICD-10-CM | POA: Diagnosis not present

## 2021-11-23 ENCOUNTER — Other Ambulatory Visit: Payer: Self-pay | Admitting: Nurse Practitioner

## 2021-11-23 DIAGNOSIS — Z1231 Encounter for screening mammogram for malignant neoplasm of breast: Secondary | ICD-10-CM

## 2021-12-06 ENCOUNTER — Encounter: Payer: Self-pay | Admitting: Nurse Practitioner

## 2021-12-06 ENCOUNTER — Other Ambulatory Visit: Payer: Self-pay | Admitting: Nurse Practitioner

## 2021-12-06 DIAGNOSIS — H6993 Unspecified Eustachian tube disorder, bilateral: Secondary | ICD-10-CM

## 2021-12-06 DIAGNOSIS — H6983 Other specified disorders of Eustachian tube, bilateral: Secondary | ICD-10-CM | POA: Diagnosis not present

## 2021-12-06 DIAGNOSIS — J329 Chronic sinusitis, unspecified: Secondary | ICD-10-CM | POA: Diagnosis not present

## 2021-12-07 ENCOUNTER — Telehealth: Payer: Self-pay | Admitting: Nurse Practitioner

## 2021-12-07 MED ORDER — LORAZEPAM 1 MG PO TABS: ORAL_TABLET | ORAL | 0 refills | Status: DC

## 2021-12-07 MED ORDER — LISDEXAMFETAMINE DIMESYLATE 50 MG PO CAPS: 50.0000 mg | ORAL_CAPSULE | Freq: Every day | ORAL | 0 refills | Status: DC

## 2021-12-07 NOTE — Telephone Encounter (Signed)
Shawnte for [facility], states pt wants referral to [facility] not [facility] so she is disregarding the referral there.

## 2021-12-08 NOTE — Telephone Encounter (Signed)
Pt aware.

## 2021-12-09 ENCOUNTER — Other Ambulatory Visit: Payer: Self-pay

## 2021-12-09 ENCOUNTER — Other Ambulatory Visit (HOSPITAL_COMMUNITY)
Admission: RE | Admit: 2021-12-09 | Discharge: 2021-12-09 | Disposition: A | Discharge status: Home / Self Care | Payer: BC Managed Care – PPO | Source: Ambulatory Visit | Attending: Obstetrics and Gynecology | Admitting: Obstetrics and Gynecology

## 2021-12-09 ENCOUNTER — Ambulatory Visit (INDEPENDENT_AMBULATORY_CARE_PROVIDER_SITE_OTHER): Payer: BC Managed Care – PPO | Admitting: Obstetrics and Gynecology

## 2021-12-09 ENCOUNTER — Encounter: Payer: Self-pay | Admitting: Obstetrics and Gynecology

## 2021-12-09 VITALS — BP 130/76 | Ht 65.0 in | Wt 223.4 lb

## 2021-12-09 DIAGNOSIS — Z124 Encounter for screening for malignant neoplasm of cervix: Secondary | ICD-10-CM

## 2021-12-09 DIAGNOSIS — Z30430 Encounter for insertion of intrauterine contraceptive device: Secondary | ICD-10-CM

## 2021-12-09 DIAGNOSIS — Z30432 Encounter for removal of intrauterine contraceptive device: Secondary | ICD-10-CM

## 2021-12-09 DIAGNOSIS — Z01419 Encounter for gynecological examination (general) (routine) without abnormal findings: Secondary | ICD-10-CM

## 2021-12-09 DIAGNOSIS — Z1231 Encounter for screening mammogram for malignant neoplasm of breast: Secondary | ICD-10-CM

## 2021-12-09 NOTE — Progress Notes (Signed)
Gynecology Annual Exam  PCP: Jon Billings, NP  Chief Complaint:  Chief Complaint  Patient presents with   Gynecologic Exam    History of Present Illness: Patient is a 46 y.o. G1P1001 presents for annual exam. The patient has no complaints today.   LMP: No LMP recorded. (Menstrual status: IUD). Average Interval: regular, monthly Duration of flow: 3 days  Heavy Menses: sometimes Dysmenorrhea: no  She denies passage of large clots She denies sensations of gushing or flooding of blood. She denies accidents where she bleeds through her clothing. She denies that she changes a saturated pad or tampon more frequently than every hour.  She denies that pain from her periods limits her activities.  The patient does perform self breast exams.  There is no notable family history of breast or ovarian cancer in her family.  The patient has regular exercise: dancing 3 days a week  The patient denies current symptoms of depression.   PHQ-9: 2 GAD-7: 2   Review of Systems: Review of Systems  Constitutional:  Negative for chills, fever, malaise/fatigue and weight loss.  HENT:  Negative for congestion, hearing loss and sinus pain.   Eyes:  Negative for blurred vision and double vision.  Respiratory:  Negative for cough, sputum production, shortness of breath and wheezing.   Cardiovascular:  Negative for chest pain, palpitations, orthopnea and leg swelling.  Gastrointestinal:  Negative for abdominal pain, constipation, diarrhea, nausea and vomiting.  Genitourinary:  Negative for dysuria, flank pain, frequency, hematuria and urgency.  Musculoskeletal:  Negative for back pain, falls and joint pain.  Skin:  Negative for itching and rash.  Neurological:  Negative for dizziness and headaches.  Psychiatric/Behavioral:  Negative for depression, substance abuse and suicidal ideas. The patient is not nervous/anxious.    Past Medical History:  Past Medical History:  Diagnosis Date    Abnormal Pap smear of cervix    Anxiety    Attention deficit hyperactivity disorder    Depression    Insomnia     Past Surgical History:  Past Surgical History:  Procedure Laterality Date   BREAST BIOPSY Right 08/29/2019   neg, calcs   CERVICAL BIOPSY  W/ LOOP ELECTRODE EXCISION     LEEP     TONSILLECTOMY      Gynecologic History:  No LMP recorded. (Menstrual status: IUD). Menarche: 13  History of fibroids, polyps, or ovarian cysts? : no  History of PCOS? no Hstory of Endometriosis? no History of abnormal pap smears? yes Have you had any sexually transmitted infections in the past?  mo  Last Pap: Results were: 01/28/2020 NIL    She identifies as a female. She is sexually active with men.   She denies dyspareunia. She denies postcoital bleeding.    Obstetric History: G1P1001  Family History:  Family History  Problem Relation Age of Onset   Hepatitis C Mother    Breast cancer Neg Hx    Cancer Neg Hx    Diabetes Neg Hx    Heart disease Neg Hx    Hypertension Neg Hx    Stroke Neg Hx    COPD Neg Hx     Social History:  Social History   Socioeconomic History   Marital status: Married    Spouse name: Not on file   Number of children: Not on file   Years of education: Not on file   Highest education level: Not on file  Occupational History   Not on file  Tobacco Use  Smoking status: Former    Types: Cigarettes    Quit date: 06/29/1999    Years since quitting: 22.4   Smokeless tobacco: Never  Vaping Use   Vaping Use: Never used  Substance and Sexual Activity   Alcohol use: Yes    Alcohol/week: 0.0 standard drinks    Comment: occasional   Drug use: No   Sexual activity: Yes    Birth control/protection: I.U.D.  Other Topics Concern   Not on file  Social History Narrative   Not on file   Social Determinants of Health   Financial Resource Strain: Not on file  Food Insecurity: Not on file  Transportation Needs: Not on file  Physical Activity: Not  on file  Stress: Not on file  Social Connections: Not on file  Intimate Partner Violence: Not on file    Allergies:  No Known Allergies  Medications: Prior to Admission medications   Medication Sig Start Date End Date Taking? Authorizing Provider  lisdexamfetamine (VYVANSE) 50 MG capsule Take 1 capsule (50 mg total) by mouth daily. 12/07/21 01/06/22 Yes Jon Billings, NP  LORazepam (ATIVAN) 1 MG tablet TAKE 1 TABLET(1 MG) BY MOUTH DAILY AS NEEDED FOR ANXIETY 12/07/21  Yes Jon Billings, NP  azelastine (ASTELIN) 0.1 % nasal spray USE 1 TO 2 SPRAYS IN EACH NOSTRIL TWICE DAILY Patient not taking: Reported on 12/09/2021 08/03/21   Jon Billings, NP  cetirizine (ZYRTEC) 10 MG tablet TAKE 1 TABLET(10 MG) BY MOUTH TWICE DAILY Patient not taking: Reported on 11/11/2021 04/30/21   Jon Billings, NP  chlorpheniramine-HYDROcodone [facility] PENNKINETIC ER) 10-8 MG/5ML SUER Take 5 mLs by mouth at bedtime as needed for cough. Patient not taking: Reported on 12/09/2021 10/27/21   Charlynne Cousins, MD  doxycycline (VIBRA-TABS) 100 MG tablet Take 1 tablet (100 mg total) by mouth 2 (two) times daily. 11/11/21   Jon Billings, NP  fluticasone (FLONASE) 50 MCG/ACT nasal spray SHAKE LIQUID AND USE 2 SPRAYS IN [facility] NOSTRIL EVERY DAY Patient not taking: Reported on 11/11/2021 05/31/21   Jon Billings, NP  ibuprofen (ADVIL) 800 MG tablet TAKE 1 TABLET(800 MG) BY MOUTH THREE TIMES DAILY Patient not taking: Reported on 11/11/2021 02/07/21   Jon Billings, NP  lisdexamfetamine (VYVANSE) 50 MG capsule Take 1 capsule (50 mg total) by mouth daily. 02/16/21 03/18/21  Jon Billings, NP  lisdexamfetamine (VYVANSE) 50 MG capsule Take 1 capsule (50 mg total) by mouth daily. 04/16/21 05/16/21  Jon Billings, NP  montelukast (SINGULAIR) 10 MG tablet TAKE 1 TABLET(10 MG) BY MOUTH AT BEDTIME Patient not taking: Reported on 11/11/2021 04/14/21   Jon Billings, NP    Physical Exam Vitals: Blood pressure 130/76,  height 5\' 5"  (1.651 m), weight 223 lb 6.4 oz (101.3 kg).  Physical Exam Constitutional:      Appearance: She is well-developed.  Genitourinary:     Genitourinary Comments: External: Normal appearing vulva. No lesions noted.  Speculum examination: Normal appearing cervix. No blood in the vaginal vault. No discharge.  NO IUD strings seen Bimanual examination: Uterus midline, non-tender, normal in size, shape and contour.  No CMT. No adnexal masses. No adnexal tenderness. Pelvis not fixed.  Breast Exam: breast equal without skin changes, nipple discharge, breast lump or enlarged lymph nodes   HENT:     Head: Normocephalic and atraumatic.  Neck:     Thyroid: No thyromegaly.  Cardiovascular:     Rate and Rhythm: Normal rate and regular rhythm.     Heart sounds: Normal heart sounds.  Pulmonary:  Effort: Pulmonary effort is normal.     Breath sounds: Normal breath sounds.  Abdominal:     General: Bowel sounds are normal. There is no distension.     Palpations: Abdomen is soft. There is no mass.  Musculoskeletal:     Cervical back: Neck supple.  Neurological:     Mental Status: She is alert and oriented to person, place, and time.  Skin:    General: Skin is warm and dry.  Psychiatric:        Behavior: Behavior normal.        Thought Content: Thought content normal.        Judgment: Judgment normal.  Vitals reviewed.     Female chaperone present for pelvic and breast  portions of the physical exam  IUD PROCEDURE DISCONTINUED IUD placed approximately 8 years ago. Since that time, she states that she has been happy with the IUD. She wishes to have her IUD removed and replaced with a new  IUD.   Discussed risks of irregular bleeding, cramping, infection, malpositioning or misplacement of the IUD outside the uterus which may require further procedure such as laparoscopy, risk of failure <1%. Time out was performed.   Patient identified, informed consent performed, consent signed.      A bimanual exam showed the uterus to be anteverted.  Speculum placed in the vagina.  Cervix visualized.    IUD Removal Strings of IUD were not able to be identified and grasped.  Cervix stenotic and tools not able to be passed into the uterine cavity. IUD not removed. Procedure discontinued.   Assessment: 47 y.o. G1P1001 routine annual exam  Plan: Problem List Items Addressed This Visit   None Visit Diagnoses     Encounter for annual routine gynecological examination    -  Primary   Cervical cancer screening       Relevant Orders   Cytology - PAP   Breast cancer screening by mammogram       Encounter for IUD removal       Encounter for IUD insertion           1) Mammogram - recommend yearly screening mammogram.  Mammogram Was ordered today  2) STI screening was offered and declined  3) pap smear today  4) Contraception - Mirena not able to be exchanged due to lost strings- will schedule time in OR to exchange.  5) Routine healthcare maintenance including cholesterol, diabetes screening discussed managed by PCP   Adrian Prows MD, [place], [place] 12/09/2021 9:09 AM

## 2021-12-09 NOTE — Patient Instructions (Signed)
Institute of Medicine Recommended Dietary Allowances for Calcium and Vitamin D  Age (yr) Calcium Recommended Dietary Allowance (mg/day) Vitamin D Recommended Dietary Allowance (international units/day)  9-18 1,300 600  19-50 1,000 600  51-70 1,200 600  71 and older 1,200 800  Data from Institute of Medicine. Dietary reference intakes: calcium, vitamin D. [place], [place]: National Academies Press; 2011.   Exercising to Stay Healthy To become healthy and stay healthy, it is recommended that you do moderate-intensity and vigorous-intensity exercise. You can tell that you are exercising at a moderate intensity if your heart starts beating faster and you start breathing faster but can still hold a conversation. You can tell that you are exercising at a vigorous intensity if you are breathing much harder and faster and cannot hold a conversation while exercising. How can exercise benefit me? Exercising regularly is important. It has many health benefits, such as: Improving overall fitness, flexibility, and endurance. Increasing bone density. Helping with weight control. Decreasing body fat. Increasing muscle strength and endurance. Reducing stress and tension, anxiety, depression, or anger. Improving overall health. What guidelines should I follow while exercising? Before you start a new exercise program, talk with your health care provider. Do not exercise so much that you hurt yourself, feel dizzy, or get very short of breath. Wear comfortable clothes and wear shoes with good support. Drink plenty of water while you exercise to prevent dehydration or heat stroke. Work out until your breathing and your heartbeat get faster (moderate intensity). How often should I exercise? Choose an activity that you enjoy, and set realistic goals. Your health care provider can help you make an activity plan that is individually designed and works best for you. Exercise regularly as told by your health  care provider. This may include: Doing strength training two times a week, such as: Lifting weights. Using resistance bands. Push-ups. Sit-ups. Yoga. Doing a certain intensity of exercise for a given amount of time. Choose from these options: A total of 150 minutes of moderate-intensity exercise every week. A total of 75 minutes of vigorous-intensity exercise every week. A mix of moderate-intensity and vigorous-intensity exercise every week. Children, pregnant women, people who have not exercised regularly, people who are overweight, and older adults may need to talk with a health care provider about what activities are safe to perform. If you have a medical condition, be sure to talk with your health care provider before you start a new exercise program. What are some exercise ideas? Moderate-intensity exercise ideas include: Walking 1 mile (1.6 km) in about 15 minutes. Biking. Hiking. Golfing. Dancing. Water aerobics. Vigorous-intensity exercise ideas include: Walking 4.5 miles (7.2 km) or more in about 1 hour. Jogging or running 5 miles (8 km) in about 1 hour. Biking 10 miles (16.1 km) or more in about 1 hour. Lap swimming. Roller-skating or in-line skating. Cross-country skiing. Vigorous competitive sports, such as football, basketball, and soccer. Jumping rope. Aerobic dancing. What are some everyday activities that can help me get exercise? Yard work, such as: Pushing a lawn mower. Raking and bagging leaves. Washing your car. Pushing a stroller. Shoveling snow. Gardening. Washing windows or floors. How can I be more active in my day-to-day activities? Use stairs instead of an elevator. Take a walk during your lunch break. If you drive, park your car farther away from your work or school. If you take public transportation, get off one stop early and walk the rest of the way. Stand up or walk around during all of   your indoor phone calls. Get up, stretch, and walk  around every 30 minutes throughout the day. Enjoy exercise with a friend. Support to continue exercising will help you keep a regular routine of activity. Where to find more information You can find more information about exercising to stay healthy from: U.S. Department of Health and Human Services: www.hhs.gov Centers for Disease Control and Prevention (CDC): www.cdc.gov Summary Exercising regularly is important. It will improve your overall fitness, flexibility, and endurance. Regular exercise will also improve your overall health. It can help you control your weight, reduce stress, and improve your bone density. Do not exercise so much that you hurt yourself, feel dizzy, or get very short of breath. Before you start a new exercise program, talk with your health care provider. This information is not intended to replace advice given to you by your health care provider. Make sure you discuss any questions you have with your health care provider. Document Revised: 02/12/2021 Document Reviewed: 02/12/2021 Elsevier Patient Education  2022 Elsevier Inc. Budget-Friendly Healthy Eating There are many ways to save money at the grocery store and continue to eat healthy. You can be successful if you: Plan meals according to your budget. Make a grocery list and only purchase food according to your grocery list. Prepare food yourself at home. What are tips for following this plan? Reading food labels Compare food labels between brand name foods and the store brand. Often the nutritional value is the same, but the store brand is lower cost. Look for products that do not have added sugar, fat, or salt (sodium). These often cost the same but are healthier for you. Products may be labeled as: Sugar-free. Nonfat. Low-fat. Sodium-free. Low-sodium. Look for lean ground beef labeled as at least 92% lean and 8% fat. Shopping  Buy only the items on your grocery list and go only to the areas of the store  that have the items on your list. Use coupons only for foods and brands you normally buy. Avoid buying items you wouldn't normally buy simply because they are on sale. Check online and in newspapers for weekly deals. Buy healthy items from the bulk bins when available, such as herbs, spices, flour, pasta, nuts, and dried fruit. Buy fruits and vegetables that are in season. Prices are usually lower on in-season produce. Look at the unit price on the price tag. Use it to compare different brands and sizes to find out which item is the best deal. Choose healthy items that are often low-cost, such as carrots, potatoes, apples, bananas, and oranges. Dried or canned beans are a low-cost protein source. Buy in bulk and freeze extra food. Items you can buy in bulk include meats, fish, poultry, frozen fruits, and frozen vegetables. Avoid buying "ready-to-eat" foods, such as pre-cut fruits and vegetables and pre-made salads. If possible, shop around to discover where you can find the best prices. Consider other retailers such as dollar stores, larger wholesale stores, local fruit and vegetable stands, and farmers markets. Do not shop when you are hungry. If you shop while hungry, it may be hard to stick to your list and budget. Resist impulse buying. Use your grocery list as your official plan for the week. Buy a variety of vegetables and fruits by purchasing fresh, frozen, and canned items. Look at the top and bottom shelves for deals. Foods at eye level (eye level of an adult or child) are usually more expensive. Be efficient with your time when shopping. The more time you   spend at the store, the more money you are likely to spend. To save money when choosing more expensive foods like meats and dairy: Choose cheaper cuts of meat, such as bone-in chicken thighs and drumsticks instead of skinless and boneless chicken. When you are ready to prepare the chicken, you can remove the skin yourself to make it  healthier. Choose lean meats like chicken or turkey instead of beef. Choose canned seafood, such as tuna, salmon, or sardines. Buy eggs as a low-cost source of protein. Buy dried beans and peas, such as lentils, split peas, or kidney beans instead of meats. Dried beans and peas are a good alternative source of protein. Buy the larger tubs of yogurt instead of individual-sized containers. Choose water instead of sodas and other sweetened beverages. Avoid buying chips, cookies, and other "junk food." These items are usually expensive and not healthy. Cooking Make extra food and freeze the extras in meal-sized containers or in individual portions for fast meals and snacks. Pre-cook on days when you have extra time to prepare meals in advance. You can keep these meals in the fridge or freezer and reheat for a quick meal. When you come home from the grocery store, wash, peel, and cut fruits and vegetables so they are ready to use and eat. This will help reduce food waste. Meal planning Do not eat out or get fast food. Prepare food at home. Make a grocery list and make sure to bring it with you to the store. If you have a smart phone, you could use your phone to create your shopping list. Plan meals and snacks according to a grocery list and budget you create. Use leftovers in your meal plan for the week. Look for recipes where you can cook once and make enough food for two meals. Prepare budget-friendly types of meals like stews, casseroles, and stir-fry dishes. Try some meatless meals or try "no cook" meals like salads. Make sure that half your plate is filled with fruits or vegetables. Choose from fresh, frozen, or canned fruits and vegetables. If eating canned, remember to rinse them before eating. This will remove any excess salt added for packaging. Summary Eating healthy on a budget is possible if you plan your meals according to your budget, purchase according to your budget and grocery list,  and prepare food yourself. Tips for buying more food on a limited budget include buying generic brands, using coupons only for foods you normally buy, and buying healthy items from the bulk bins when available. Tips for buying cheaper food to replace expensive food include choosing cheaper, lean cuts of meat, and buying dried beans and peas. This information is not intended to replace advice given to you by your health care provider. Make sure you discuss any questions you have with your health care provider. Document Revised: 07/30/2020 Document Reviewed: 07/30/2020 Elsevier Patient Education  2022 Elsevier Inc. Bone Health Bones protect organs, store calcium, anchor muscles, and support the whole body. Keeping your bones strong is important, especially as you get older. You can take actions to help keep your bones strong and healthy. Why is keeping my bones healthy important? Keeping your bones healthy is important because your body constantly replaces bone cells. Cells get old, and new cells take their place. As we age, we lose bone cells because the body may not be able to make enough new cells to replace the old cells. The amount of bone cells and bone tissue you have is referred to as   bone mass. The higher your bone mass, the stronger your bones. The aging process leads to an overall loss of bone mass in the body, which can increase the likelihood of: Broken bones. A condition in which the bones become weak and brittle (osteoporosis). A large decline in bone mass occurs in older adults. In women, it occurs about the time of menopause. What actions can I take to keep my bones healthy? Good health habits are important for maintaining healthy bones. This includes eating nutritious foods and exercising regularly. To have healthy bones, you need to get enough of the right minerals and vitamins. Most nutrition experts recommend getting these nutrients from the foods that you eat. In some cases,  taking supplements may also be recommended. Doing certain types of exercise is also important for bone health. What are the nutritional recommendations for healthy bones? Eating a well-balanced diet with plenty of calcium and vitamin D will help to protect your bones. Nutritional recommendations vary from person to person. Ask your health care provider what is healthy for you. Here are some general guidelines. Get enough calcium Calcium is the most important (essential) mineral for bone health. Most people can get enough calcium from their diet, but supplements may be recommended for people who are at risk for osteoporosis. Good sources of calcium include: Dairy products, such as low-fat or nonfat milk, cheese, and yogurt. Dark green leafy vegetables, such as bok choy and broccoli. Foods that have calcium added to them (are fortified). Foods that may be fortified with calcium include orange juice, cereal, bread, soy beverages, and tofu products. Nuts, such as almonds. Follow these recommended amounts for daily calcium intake: Infants, 0-6 months: 200 mg. Infants, 6-12 months: 260 mg. Children, age 1-3: 700 mg. Children, age 4-8: 1,000 mg. Children, age 9-13: 1,300 mg. Teens, age 14-18: 1,300 mg. Adults, age 19-50: 1,000 mg. Adults, age 51-70: Men: 1,000 mg. Women: 1,200 mg. Adults, age 71 or older: 1,200 mg. Pregnant and breastfeeding females: Teens: 1,300 mg. Adults: 1,000 mg. Get enough vitamin D Vitamin D is the most essential vitamin for bone health. It helps the body absorb calcium. Sunlight stimulates the skin to make vitamin D, so be sure to get enough sunlight. If you live in a cold climate or you do not get outside often, your health care provider may recommend that you take vitamin D supplements. Good sources of vitamin D in your diet include: Egg yolks. Saltwater fish. Milk and cereal fortified with vitamin D. Follow these recommended amounts for daily vitamin D  intake: Infants, 0-12 months: 400 international units (IU). Children and teens, age 1-18: 600 international units. Adults, age 59 or younger: 600 international units. Adults, age 60 or older: 600-1,000 international units. Get other important nutrients Other nutrients that are important for bone health include: Phosphorus. This mineral is found in meat, poultry, dairy foods, nuts, and legumes. The recommended daily intake for adult men and adult women is 700 mg. Magnesium. This mineral is found in seeds, nuts, dark green vegetables, and legumes. The recommended daily intake for adult men is 400-420 mg. For adult women, it is 310-320 mg. Vitamin K. This vitamin is found in green leafy vegetables. The recommended daily intake is 120 mcg for adult men and 90 mcg for adult women. What type of physical activity is best for building and maintaining healthy bones? Weight-bearing and strength-building activities are important for building and maintaining healthy bones. Weight-bearing activities cause muscles and bones to work against gravity. Strength-building activities   increase the strength of the muscles that support bones. Weight-bearing and muscle-building activities include: Walking and hiking. Jogging and running. Dancing. Gym exercises. Lifting weights. Tennis and racquetball. Climbing stairs. Aerobics. Adults should get at least 30 minutes of moderate physical activity on most days. Children should get at least 60 minutes of moderate physical activity on most days. Ask your health care provider what type of exercise is best for you. How can I find out if my bone mass is low? Bone mass can be measured with an X-ray test called a bone mineral density (BMD) test. This test is recommended for all women who are age 65 or older. It may also be recommended for: Men who are age 70 or older. People who are at risk for osteoporosis because of: Having a long-term disease that weakens bones, such as  kidney disease or rheumatoid arthritis. Having menopause earlier than normal. Taking medicine that weakens bones, such as steroids, thyroid hormones, or hormone treatment for breast cancer or prostate cancer. Smoking. Drinking three or more alcoholic drinks a day. Being underweight. Sedentary lifestyle. If you find that you have a low bone mass, you may be able to prevent osteoporosis or further bone loss by changing your diet and lifestyle. Where can I find more information? Bone Health & Osteoporosis Foundation: www.nof.org/patients National Institutes of Health: www.bones.nih.gov International Osteoporosis Foundation: www.iofbonehealth.org Summary The aging process leads to an overall loss of bone mass in the body, which can increase the likelihood of broken bones and osteoporosis. Eating a well-balanced diet with plenty of calcium and vitamin D will help to protect your bones. Weight-bearing and strength-building activities are also important for building and maintaining strong bones. Bone mass can be measured with an X-ray test called a bone mineral density (BMD) test. This information is not intended to replace advice given to you by your health care provider. Make sure you discuss any questions you have with your health care provider. Document Revised: 03/31/2021 Document Reviewed: 03/31/2021 Elsevier Patient Education  2022 Elsevier Inc.  

## 2021-12-10 LAB — CYTOLOGY - PAP
Comment: NEGATIVE
Diagnosis: NEGATIVE
High risk HPV: NEGATIVE

## 2021-12-13 DIAGNOSIS — T8339XA Other mechanical complication of intrauterine contraceptive device, initial encounter: Secondary | ICD-10-CM | POA: Diagnosis not present

## 2021-12-14 ENCOUNTER — Telehealth: Payer: Self-pay

## 2021-12-14 ENCOUNTER — Other Ambulatory Visit: Payer: Self-pay | Admitting: Nurse Practitioner

## 2021-12-14 NOTE — Telephone Encounter (Signed)
Left a message for the patient to return the call.  

## 2021-12-15 MED ORDER — IBUPROFEN 800 MG PO TABS: 800.0000 mg | ORAL_TABLET | Freq: Three times a day (TID) | ORAL | 0 refills | Status: DC | PRN

## 2021-12-22 NOTE — Telephone Encounter (Signed)
Left a message for the patient to return the call.  

## 2021-12-29 ENCOUNTER — Ambulatory Visit
Admission: RE | Admit: 2021-12-29 | Discharge: 2021-12-29 | Disposition: A | Discharge status: Home / Self Care | Payer: BC Managed Care – PPO | Source: Ambulatory Visit | Attending: Nurse Practitioner | Admitting: Nurse Practitioner

## 2021-12-29 ENCOUNTER — Other Ambulatory Visit: Payer: Self-pay

## 2021-12-29 DIAGNOSIS — Z1231 Encounter for screening mammogram for malignant neoplasm of breast: Secondary | ICD-10-CM | POA: Insufficient documentation

## 2021-12-30 NOTE — Progress Notes (Signed)
Please let patient know her Mammogram did not show any evidence of a malignancy.  The recommendation is to repeat the Mammogram in 1 year.  

## 2022-01-03 ENCOUNTER — Other Ambulatory Visit: Payer: Self-pay | Admitting: Nurse Practitioner

## 2022-01-03 MED ORDER — LISDEXAMFETAMINE DIMESYLATE 50 MG PO CAPS: 50.0000 mg | ORAL_CAPSULE | Freq: Every day | ORAL | 0 refills | Status: DC

## 2022-01-03 MED ORDER — LORAZEPAM 1 MG PO TABS: ORAL_TABLET | ORAL | 0 refills | Status: DC

## 2022-02-02 ENCOUNTER — Other Ambulatory Visit: Payer: Self-pay | Admitting: Nurse Practitioner

## 2022-02-02 MED ORDER — LISDEXAMFETAMINE DIMESYLATE 50 MG PO CAPS: 50.0000 mg | ORAL_CAPSULE | Freq: Every day | ORAL | 0 refills | Status: DC

## 2022-02-02 MED ORDER — LORAZEPAM 1 MG PO TABS: ORAL_TABLET | ORAL | 0 refills | Status: DC

## 2022-02-02 MED ORDER — IBUPROFEN 800 MG PO TABS: 800.0000 mg | ORAL_TABLET | Freq: Three times a day (TID) | ORAL | 0 refills | Status: DC | PRN

## 2022-02-03 NOTE — Telephone Encounter (Signed)
Refilled 02/02/2022 #90. ?Requested Prescriptions  ?Pending Prescriptions Disp Refills  ?? ibuprofen (ADVIL) 800 MG tablet [Pharmacy Med Name: IBUPROFEN 800MG TABLETS] 90 tablet 0  ?  Sig: TAKE 1 TABLET(800 MG) BY MOUTH EVERY 8 HOURS AS NEEDED  ?  ? Analgesics:  NSAIDS Failed - 02/02/2022  7:00 AM  ?  ?  Failed - Manual Review: Labs are only required if the patient has taken medication for more than 8 weeks.  ?  ?  Failed - HGB in normal range and within 360 days  ?  Hemoglobin  ?Date Value Ref Range Status  ?04/02/2020 15.0 11.1 - 15.9 g/dL Final  ?   ?  ?  Failed - PLT in normal range and within 360 days  ?  Platelets  ?Date Value Ref Range Status  ?04/02/2020 250 150 - 450 x10E3/uL Final  ?   ?  ?  Failed - HCT in normal range and within 360 days  ?  Hematocrit  ?Date Value Ref Range Status  ?04/02/2020 44.5 34.0 - 46.6 % Final  ?   ?  ?  Failed - eGFR is 30 or above and within 360 days  ?  GFR calc Af Amer  ?Date Value Ref Range Status  ?04/02/2020 104 >59 mL/min/1.73 Final  ?  Comment:  ?  **[facility] currently reports eGFR in compliance with the current** ?  recommendations of the [employer]. [facility] will ?  update reporting as new guidelines are published from the NKF-ASN ?  Task force. ?  ? ?GFR calc non Af Amer  ?Date Value Ref Range Status  ?04/02/2020 90 >59 mL/min/1.73 Final  ?   ?  ?  Passed - Cr in normal range and within 360 days  ?  Creatinine  ?Date Value Ref Range Status  ?02/15/2021 21.3 20.0 - 300.0 mg/dL Final  ? ?Creatinine, Ser  ?Date Value Ref Range Status  ?04/02/2020 0.79 0.57 - 1.00 mg/dL Final  ?   ?  ?  Passed - Patient is not pregnant  ?  ?  Passed - Valid encounter within last 12 months  ?  Recent Outpatient Visits   ?      ? 2 months ago Anxiety  ? Mitchell, NP  ? 3 months ago Acute non-recurrent sinusitis, unspecified location  ? [facility] Vigg, Avanti, MD  ? 5 months ago Attention deficit hyperactivity disorder (ADHD),  unspecified ADHD type  ? Allison, NP  ? 9 months ago Dysfunction of both eustachian tubes  ? Henefer, NP  ? 10 months ago Dysfunction of both eustachian tubes  ? [place], [place] P, DO  ?  ?  ? ?  ?  ?  ? ?

## 2022-02-07 DIAGNOSIS — Z01812 Encounter for preprocedural laboratory examination: Secondary | ICD-10-CM | POA: Diagnosis not present

## 2022-02-07 DIAGNOSIS — Z30433 Encounter for removal and reinsertion of intrauterine contraceptive device: Secondary | ICD-10-CM | POA: Diagnosis not present

## 2022-02-07 DIAGNOSIS — Z113 Encounter for screening for infections with a predominantly sexual mode of transmission: Secondary | ICD-10-CM | POA: Diagnosis not present

## 2022-02-11 DIAGNOSIS — Z79899 Other long term (current) drug therapy: Secondary | ICD-10-CM | POA: Diagnosis not present

## 2022-02-11 DIAGNOSIS — H90A31 Mixed conductive and sensorineural hearing loss, unilateral, right ear with restricted hearing on the contralateral side: Secondary | ICD-10-CM | POA: Diagnosis not present

## 2022-02-11 DIAGNOSIS — Z570 Occupational exposure to noise: Secondary | ICD-10-CM | POA: Diagnosis not present

## 2022-02-11 DIAGNOSIS — R42 Dizziness and giddiness: Secondary | ICD-10-CM | POA: Diagnosis not present

## 2022-02-11 DIAGNOSIS — H9319 Tinnitus, unspecified ear: Secondary | ICD-10-CM | POA: Diagnosis not present

## 2022-02-11 DIAGNOSIS — H6983 Other specified disorders of Eustachian tube, bilateral: Secondary | ICD-10-CM | POA: Diagnosis not present

## 2022-02-11 DIAGNOSIS — R519 Headache, unspecified: Secondary | ICD-10-CM | POA: Diagnosis not present

## 2022-02-11 DIAGNOSIS — H918X9 Other specified hearing loss, unspecified ear: Secondary | ICD-10-CM | POA: Diagnosis not present

## 2022-02-11 DIAGNOSIS — H6690 Otitis media, unspecified, unspecified ear: Secondary | ICD-10-CM | POA: Diagnosis not present

## 2022-02-11 DIAGNOSIS — Z6835 Body mass index (BMI) 35.0-35.9, adult: Secondary | ICD-10-CM | POA: Diagnosis not present

## 2022-02-11 DIAGNOSIS — J309 Allergic rhinitis, unspecified: Secondary | ICD-10-CM | POA: Diagnosis not present

## 2022-03-02 ENCOUNTER — Other Ambulatory Visit: Payer: Self-pay | Admitting: Nurse Practitioner

## 2022-03-02 ENCOUNTER — Other Ambulatory Visit: Payer: Self-pay | Admitting: Anesthesiology

## 2022-03-02 DIAGNOSIS — H90A31 Mixed conductive and sensorineural hearing loss, unilateral, right ear with restricted hearing on the contralateral side: Secondary | ICD-10-CM

## 2022-03-03 MED ORDER — LORAZEPAM 1 MG PO TABS: ORAL_TABLET | ORAL | 0 refills | Status: DC

## 2022-03-03 MED ORDER — LISDEXAMFETAMINE DIMESYLATE 50 MG PO CAPS: 50.0000 mg | ORAL_CAPSULE | Freq: Every day | ORAL | 0 refills | Status: DC

## 2022-03-09 ENCOUNTER — Other Ambulatory Visit: Payer: Self-pay | Admitting: Internal Medicine

## 2022-03-09 ENCOUNTER — Encounter: Payer: Self-pay | Admitting: Internal Medicine

## 2022-03-09 ENCOUNTER — Ambulatory Visit: Payer: BC Managed Care – PPO | Admitting: Internal Medicine

## 2022-03-09 VITALS — BP 130/89 | HR 89 | Temp 97.9°F | Ht 65.0 in | Wt 220.0 lb

## 2022-03-09 DIAGNOSIS — Z0000 Encounter for general adult medical examination without abnormal findings: Secondary | ICD-10-CM

## 2022-03-09 DIAGNOSIS — Z136 Encounter for screening for cardiovascular disorders: Secondary | ICD-10-CM | POA: Diagnosis not present

## 2022-03-09 LAB — URINALYSIS, ROUTINE W REFLEX MICROSCOPIC
Bilirubin, UA: NEGATIVE
Glucose, UA: NEGATIVE
Ketones, UA: NEGATIVE
Leukocytes,UA: NEGATIVE
Nitrite, UA: NEGATIVE
Protein,UA: NEGATIVE
RBC, UA: NEGATIVE
Specific Gravity, UA: 1.02 (ref 1.005–1.030)
Urobilinogen, Ur: 0.2 mg/dL (ref 0.2–1.0)
pH, UA: 7 (ref 5.0–7.5)

## 2022-03-09 MED ORDER — FEXOFENADINE HCL 180 MG PO TABS: 180.0000 mg | ORAL_TABLET | Freq: Every day | ORAL | 1 refills | Status: DC

## 2022-03-09 NOTE — Progress Notes (Signed)
? ?Ht 5\' 5"  (1.651 m)   Wt 220 lb (99.8 kg)   BMI 36.61 kg/m?   ? ?Subjective:  ? ? Patient ID: Tammy Mays, female    DOB: Mar 09, 1976, 46 y.o.   MRN: [id number] ? ?Chief Complaint  ?Patient presents with  ? Annual Exam  ? ? ?HPI: ?Tammy Mays is a 46 y.o. female ? ?Pt is here for a physical.  ? ?Sinus Problem ?Chronicity: this friday has an MRI for chronic recurrent sinusitis she has a right ear infection as well - and has had a scarring noted. Associated symptoms include sinus pressure. Pertinent negatives include no chills, congestion, coughing, headaches or shortness of breath.  ? ?Chief Complaint  ?Patient presents with  ? Annual Exam  ? ? ?Relevant past medical, surgical, family and social history reviewed and updated as indicated. Interim medical history since our last visit reviewed. ?Allergies and medications reviewed and updated. ? ?Review of Systems  ?Constitutional:  Negative for activity change, appetite change, chills, fatigue and fever.  ?HENT:  Positive for postnasal drip, sinus pressure and sinus pain. Negative for congestion.   ?Eyes:  Negative for visual disturbance.  ?Respiratory:  Negative for apnea, cough, chest tightness, shortness of breath and wheezing.   ?Cardiovascular:  Negative for chest pain, palpitations and leg swelling.  ?Gastrointestinal:  Negative for abdominal distention, abdominal pain, blood in stool, constipation, diarrhea, nausea and rectal pain.  ?Endocrine: Negative for cold intolerance, heat intolerance, polydipsia, polyphagia and polyuria.  ?Genitourinary:  Negative for difficulty urinating, frequency, hematuria, menstrual problem and urgency.  ?Skin:  Negative for color change and rash.  ?Neurological:  Negative for dizziness, speech difficulty, weakness, light-headedness, numbness and headaches.  ?Psychiatric/Behavioral:  Negative for behavioral problems and confusion. The patient is not nervous/anxious.   ? ?Per HPI unless specifically indicated above ? ?    ?Objective:  ?  ?Ht 5\' 5"  (1.651 m)   Wt 220 lb (99.8 kg)   BMI 36.61 kg/m?   ?Wt Readings from Last 3 Encounters:  ?03/09/22 220 lb (99.8 kg)  ?12/09/21 223 lb 6.4 oz (101.3 kg)  ?11/11/21 218 lb 6.4 oz (99.1 kg)  ?  ?Physical Exam ?Vitals and nursing note reviewed.  ?Constitutional:   ?   General: She is not in acute distress. ?   Appearance: Normal appearance. She is not ill-appearing or diaphoretic.  ?HENT:  ?   Head: Normocephalic and atraumatic.  ?   Right Ear: Tympanic membrane and external ear normal. There is no impacted cerumen.  ?   Left Ear: External ear normal.  ?   Nose: No congestion or rhinorrhea.  ?   Mouth/Throat:  ?   Pharynx: No oropharyngeal exudate or posterior oropharyngeal erythema.  ?Eyes:  ?   Conjunctiva/sclera: Conjunctivae normal.  ?   Pupils: Pupils are equal, round, and reactive to light.  ?Cardiovascular:  ?   Rate and Rhythm: Normal rate and regular rhythm.  ?   Heart sounds: No murmur heard. ?  No friction rub. No gallop.  ?Pulmonary:  ?   Effort: No respiratory distress.  ?   Breath sounds: No stridor. No wheezing or rhonchi.  ?Chest:  ?   Chest wall: No tenderness.  ?Abdominal:  ?   General: Abdomen is flat. Bowel sounds are normal. There is no distension.  ?   Palpations: Abdomen is soft. There is no mass.  ?   Tenderness: There is no abdominal tenderness. There is no guarding.  ?Musculoskeletal:     ?  General: No swelling or deformity.  ?   Cervical back: Normal range of motion and neck supple. No rigidity or tenderness.  ?   Right lower leg: No edema.  ?   Left lower leg: No edema.  ?Skin: ?   General: Skin is warm and dry.  ?   Coloration: Skin is not jaundiced.  ?   Findings: No erythema.  ?Neurological:  ?   Mental Status: She is alert and oriented to person, place, and time. Mental status is at baseline.  ?Psychiatric:     ?   Mood and Affect: Mood normal.     ?   Behavior: Behavior normal.     ?   Thought Content: Thought content normal.     ?   Judgment: Judgment  normal.  ? ? ?Results for orders placed or performed in visit on 12/09/21  ?Cytology - PAP  ?Result Value Ref Range  ? High risk HPV Negative   ? Adequacy    ?  Satisfactory for evaluation; transformation zone component PRESENT.  ? Diagnosis    ?  - Negative for intraepithelial lesion or malignancy (NILM)  ? Comment Normal Reference Range HPV - Negative   ? ?   ? ? ?Current Outpatient Medications:  ?  azelastine (ASTELIN) 0.1 % nasal spray, USE 1 TO 2 SPRAYS IN EACH NOSTRIL TWICE DAILY (Patient not taking: Reported on 12/09/2021), Disp: 30 mL, Rfl: 11 ?  cetirizine (ZYRTEC) 10 MG tablet, TAKE 1 TABLET(10 MG) BY MOUTH TWICE DAILY (Patient not taking: Reported on 11/11/2021), Disp: 180 tablet, Rfl: 3 ?  chlorpheniramine-HYDROcodone (TUSSIONEX PENNKINETIC ER) 10-8 MG/5ML SUER, Take 5 mLs by mouth at bedtime as needed for cough. (Patient not taking: Reported on 12/09/2021), Disp: 115 mL, Rfl: 0 ?  doxycycline (VIBRA-TABS) 100 MG tablet, Take 1 tablet (100 mg total) by mouth 2 (two) times daily., Disp: 14 tablet, Rfl: 0 ?  fluticasone (FLONASE) 50 MCG/ACT nasal spray, SHAKE LIQUID AND USE 2 SPRAYS IN EACH NOSTRIL EVERY DAY (Patient not taking: Reported on 11/11/2021), Disp: 16 g, Rfl: 11 ?  ibuprofen (ADVIL) 800 MG tablet, Take 1 tablet (800 mg total) by mouth every 8 (eight) hours as needed., Disp: 90 tablet, Rfl: 0 ?  lisdexamfetamine (VYVANSE) 50 MG capsule, Take 1 capsule (50 mg total) by mouth daily., Disp: 30 capsule, Rfl: 0 ?  lisdexamfetamine (VYVANSE) 50 MG capsule, Take 1 capsule (50 mg total) by mouth daily., Disp: 30 capsule, Rfl: 0 ?  lisdexamfetamine (VYVANSE) 50 MG capsule, Take 1 capsule (50 mg total) by mouth daily., Disp: 30 capsule, Rfl: 0 ?  LORazepam (ATIVAN) 1 MG tablet, TAKE 1 TABLET(1 MG) BY MOUTH DAILY AS NEEDED FOR ANXIETY, Disp: 30 tablet, Rfl: 0 ?  montelukast (SINGULAIR) 10 MG tablet, TAKE 1 TABLET(10 MG) BY MOUTH AT BEDTIME (Patient not taking: Reported on 11/11/2021), Disp: 30 tablet, Rfl: 3   ? ? ?Assessment & Plan:  ?PHYSICAL :  Physical Wnl ?will check CMP, FLP, CBC,TSH ? ?Chronic recurrent sinusitis will need to fu with ENT as scheudled to see them at [facility] and is to have an MRI for such  ?Chroinc, stable.  ?Change anti histamines to allegra  ? ? ?Problem List Items Addressed This Visit   ?None ?  ? ?No orders of the defined types were placed in this encounter. ?  ? ?No orders of the defined types were placed in this encounter. ?  ? ?Follow up plan: ?No follow-ups on file. ? ?

## 2022-03-09 NOTE — Telephone Encounter (Signed)
Refilled 03/09/2022 #30 1 refill. ?Requested Prescriptions  ?Pending Prescriptions Disp Refills  ?? ALLERGY RELIEF 180 MG tablet [Pharmacy Med Name: FEXOFENADINE 180MG  TABLETS (OTC)] 90 tablet   ?  Sig: TAKE 1 TABLET(180 MG) BY MOUTH DAILY  ?  ? Ear, Nose, and Throat:  Antihistamines Passed - 03/09/2022  2:19 PM  ?  ?  Passed - Valid encounter within last 12 months  ?  Recent Outpatient Visits   ?      ? Today Annual physical exam  ? [facility] Vigg, Avanti, MD  ? 3 months ago Anxiety  ? [facility], NP  ? 4 months ago Acute non-recurrent sinusitis, unspecified location  ? [facility] Vigg, Avanti, MD  ? 6 months ago Attention deficit hyperactivity disorder (ADHD), unspecified ADHD type  ? [facility], NP  ? 10 months ago Dysfunction of both eustachian tubes  ? [facility], NP  ?  ?  ? ?  ?  ?  ? ?

## 2022-03-10 LAB — COMPREHENSIVE METABOLIC PANEL
ALT: 17 IU/L (ref 0–32)
AST: 14 IU/L (ref 0–40)
Albumin/Globulin Ratio: 2.5 — ABNORMAL HIGH (ref 1.2–2.2)
Albumin: 4.5 g/dL (ref 3.8–4.8)
Alkaline Phosphatase: 64 IU/L (ref 44–121)
BUN/Creatinine Ratio: 14 (ref 9–23)
BUN: 11 mg/dL (ref 6–24)
Bilirubin Total: 0.4 mg/dL (ref 0.0–1.2)
CO2: 22 mmol/L (ref 20–29)
Calcium: 9.4 mg/dL (ref 8.7–10.2)
Chloride: 103 mmol/L (ref 96–106)
Creatinine, Ser: 0.81 mg/dL (ref 0.57–1.00)
Globulin, Total: 1.8 g/dL (ref 1.5–4.5)
Glucose: 86 mg/dL (ref 70–99)
Potassium: 4.3 mmol/L (ref 3.5–5.2)
Sodium: 138 mmol/L (ref 134–144)
Total Protein: 6.3 g/dL (ref 6.0–8.5)
eGFR: 89 mL/min/{1.73_m2} (ref >59)

## 2022-03-10 LAB — CBC WITH DIFFERENTIAL/PLATELET
Basophils Absolute: 0 10*3/uL (ref 0.0–0.2)
Basos: 0 %
EOS (ABSOLUTE): 0.1 10*3/uL (ref 0.0–0.4)
Eos: 1 %
Hematocrit: 44.2 % (ref 34.0–46.6)
Hemoglobin: 15 g/dL (ref 11.1–15.9)
Immature Grans (Abs): 0 10*3/uL (ref 0.0–0.1)
Immature Granulocytes: 0 %
Lymphocytes Absolute: 2.6 10*3/uL (ref 0.7–3.1)
Lymphs: 37 %
MCH: 29.3 pg (ref 26.6–33.0)
MCHC: 33.9 g/dL (ref 31.5–35.7)
MCV: 86 fL (ref 79–97)
Monocytes Absolute: 0.6 10*3/uL (ref 0.1–0.9)
Monocytes: 8 %
Neutrophils Absolute: 3.7 10*3/uL (ref 1.4–7.0)
Neutrophils: 54 %
Platelets: 273 10*3/uL (ref 150–450)
RBC: 5.12 x10E6/uL (ref 3.77–5.28)
RDW: 12.4 % (ref 11.7–15.4)
WBC: 7 10*3/uL (ref 3.4–10.8)

## 2022-03-10 LAB — LIPID PANEL
Chol/HDL Ratio: 2.8 ratio (ref 0.0–4.4)
Cholesterol, Total: 165 mg/dL (ref 100–199)
HDL: 59 mg/dL (ref >39)
LDL Chol Calc (NIH): 90 mg/dL (ref 0–99)
Triglycerides: 89 mg/dL (ref 0–149)
VLDL Cholesterol Cal: 16 mg/dL (ref 5–40)

## 2022-03-10 LAB — TSH: TSH: 1.73 u[IU]/mL (ref 0.450–4.500)

## 2022-03-11 ENCOUNTER — Ambulatory Visit
Admission: RE | Admit: 2022-03-11 | Discharge: 2022-03-11 | Disposition: A | Discharge status: Home / Self Care | Payer: BC Managed Care – PPO | Source: Ambulatory Visit | Attending: Anesthesiology | Admitting: Anesthesiology

## 2022-03-11 DIAGNOSIS — H9071 Mixed conductive and sensorineural hearing loss, unilateral, right ear, with unrestricted hearing on the contralateral side: Secondary | ICD-10-CM | POA: Diagnosis not present

## 2022-03-11 DIAGNOSIS — H90A31 Mixed conductive and sensorineural hearing loss, unilateral, right ear with restricted hearing on the contralateral side: Secondary | ICD-10-CM

## 2022-03-11 DIAGNOSIS — H90A21 Sensorineural hearing loss, unilateral, right ear, with restricted hearing on the contralateral side: Secondary | ICD-10-CM | POA: Diagnosis not present

## 2022-03-11 DIAGNOSIS — J341 Cyst and mucocele of nose and nasal sinus: Secondary | ICD-10-CM | POA: Diagnosis not present

## 2022-03-11 DIAGNOSIS — J329 Chronic sinusitis, unspecified: Secondary | ICD-10-CM | POA: Diagnosis not present

## 2022-03-11 MED ORDER — GADOBENATE DIMEGLUMINE 529 MG/ML IV SOLN
20.0000 mL | Freq: Once | INTRAVENOUS | Status: AC | PRN
  Administered 2022-03-11: 20 mL via INTRAVENOUS

## 2022-03-14 ENCOUNTER — Encounter: Payer: Self-pay | Admitting: Internal Medicine

## 2022-04-01 ENCOUNTER — Other Ambulatory Visit: Payer: Self-pay | Admitting: Nurse Practitioner

## 2022-04-04 MED ORDER — LORAZEPAM 1 MG PO TABS: ORAL_TABLET | ORAL | 0 refills | Status: DC

## 2022-04-04 MED ORDER — LISDEXAMFETAMINE DIMESYLATE 50 MG PO CAPS: 50.0000 mg | ORAL_CAPSULE | Freq: Every day | ORAL | 0 refills | Status: DC

## 2022-04-15 DIAGNOSIS — H90A31 Mixed conductive and sensorineural hearing loss, unilateral, right ear with restricted hearing on the contralateral side: Secondary | ICD-10-CM | POA: Diagnosis not present

## 2022-04-15 DIAGNOSIS — Z45321 Encounter for adjustment and management of cochlear device: Secondary | ICD-10-CM | POA: Diagnosis not present

## 2022-04-15 DIAGNOSIS — Z888 Allergy status to other drugs, medicaments and biological substances status: Secondary | ICD-10-CM | POA: Diagnosis not present

## 2022-04-15 DIAGNOSIS — H903 Sensorineural hearing loss, bilateral: Secondary | ICD-10-CM | POA: Diagnosis not present

## 2022-05-01 ENCOUNTER — Other Ambulatory Visit: Payer: Self-pay | Admitting: Nurse Practitioner

## 2022-05-02 NOTE — Telephone Encounter (Signed)
Medication discontinued 03/09/2022. Requested Prescriptions  Pending Prescriptions Disp Refills  . cetirizine (ZYRTEC) 10 MG tablet [Pharmacy Med Name: CETIRIZINE 10MG  TABLETS] 180 tablet 3    Sig: TAKE 1 TABLET(10 MG) BY MOUTH TWICE DAILY     Ear, Nose, and Throat:  Antihistamines 2 Passed - 05/01/2022  9:55 AM      Passed - Cr in normal range and within 360 days    Creatinine  Date Value Ref Range Status  02/15/2021 21.3 20.0 - 300.0 mg/dL Final   Creatinine, Ser  Date Value Ref Range Status  03/09/2022 0.81 0.57 - 1.00 mg/dL Final         Passed - Valid encounter within last 12 months    Recent Outpatient Visits          1 month ago Annual physical exam   [facility] Vigg, Avanti, MD   5 months ago Anxiety   [facility] [place], [place], [place]   6 months ago Acute non-recurrent sinusitis, unspecified location   [facility] Vigg, Avanti, MD   8 months ago Attention deficit hyperactivity disorder (ADHD), unspecified ADHD type   [facility], NP   1 year ago Dysfunction of both eustachian tubes   [facility], NP

## 2022-05-04 ENCOUNTER — Other Ambulatory Visit: Payer: Self-pay | Admitting: Nurse Practitioner

## 2022-05-04 MED ORDER — LORAZEPAM 1 MG PO TABS: ORAL_TABLET | ORAL | 0 refills | Status: DC

## 2022-05-04 MED ORDER — LISDEXAMFETAMINE DIMESYLATE 50 MG PO CAPS: 50.0000 mg | ORAL_CAPSULE | Freq: Every day | ORAL | 0 refills | Status: DC

## 2022-05-16 ENCOUNTER — Encounter: Payer: Self-pay | Admitting: Nurse Practitioner

## 2022-05-16 MED ORDER — CETIRIZINE HCL 10 MG PO TABS: 10.0000 mg | ORAL_TABLET | Freq: Every day | ORAL | 1 refills | Status: DC

## 2022-05-30 ENCOUNTER — Encounter: Payer: Self-pay | Admitting: Nurse Practitioner

## 2022-06-02 ENCOUNTER — Other Ambulatory Visit: Payer: Self-pay | Admitting: Nurse Practitioner

## 2022-06-02 ENCOUNTER — Encounter: Payer: Self-pay | Admitting: Nurse Practitioner

## 2022-06-02 ENCOUNTER — Ambulatory Visit: Payer: BC Managed Care – PPO | Admitting: Nurse Practitioner

## 2022-06-02 ENCOUNTER — Telehealth (INDEPENDENT_AMBULATORY_CARE_PROVIDER_SITE_OTHER): Payer: BC Managed Care – PPO | Admitting: Nurse Practitioner

## 2022-06-02 DIAGNOSIS — F419 Anxiety disorder, unspecified: Secondary | ICD-10-CM

## 2022-06-02 DIAGNOSIS — F909 Attention-deficit hyperactivity disorder, unspecified type: Secondary | ICD-10-CM | POA: Diagnosis not present

## 2022-06-02 MED ORDER — LORAZEPAM 1 MG PO TABS: ORAL_TABLET | ORAL | 0 refills | Status: DC

## 2022-06-02 MED ORDER — LISDEXAMFETAMINE DIMESYLATE 50 MG PO CAPS
50.0000 mg | ORAL_CAPSULE | Freq: Every day | ORAL | 0 refills | Status: DC
Start: 2022-06-09 — End: 2022-07-08

## 2022-06-02 MED ORDER — TRIAMCINOLONE ACETONIDE 0.1 % EX OINT: 1.0000 | TOPICAL_OINTMENT | Freq: Two times a day (BID) | CUTANEOUS | 0 refills | Status: DC

## 2022-06-02 NOTE — Telephone Encounter (Signed)
Requested Prescriptions  Pending Prescriptions Disp Refills  . fluticasone (FLONASE) 50 MCG/ACT nasal spray [Pharmacy Med Name: FLUTICASONE NASAL SP (120) RX] 16 g 11    Sig: SHAKE LIQUID AND USE 2 SPRAYS IN EACH NOSTRIL EVERY DAY     Ear, Nose, and Throat: Nasal Preparations - Corticosteroids Passed - 06/02/2022  3:17 AM      Passed - Valid encounter within last 12 months    Recent Outpatient Visits          Today Anxiety   [facility] Larae Grooms, NP   2 months ago Annual physical exam   [facility] Vigg, Avanti, MD   6 months ago Anxiety   [facility] Larae Grooms, NP   7 months ago Acute non-recurrent sinusitis, unspecified location   [facility] Vigg, Avanti, MD   9 months ago Attention deficit hyperactivity disorder (ADHD), unspecified ADHD type   [facility] Larae Grooms, NP

## 2022-06-02 NOTE — Assessment & Plan Note (Signed)
Chronic.  Under good control on current regimen of Vyvanse 50mg  daily.  Refill sent today.  Controlled substance agreement and UDS up to date.  Continue current regimen. Continue to monitor. Call with any concerns.  Follow up 3 months.

## 2022-06-02 NOTE — Progress Notes (Signed)
There were no vitals taken for this visit.   Subjective:    Patient ID: Tammy Mays, female    DOB: 1976-08-27, 46 y.o.   MRN: [id number]  HPI: Tammy Mays is a 46 y.o. female  Chief Complaint  Patient presents with   Medication Refill    Vyvanse and lorazepam - refill please. Kenalog 0.1 % also needs refill on this, this medication was d/c but she would like a new rx for this      ADHD FOLLOW UP ADHD status: stable- well controlled. Satisfied with current therapy: yes Medication compliance:  excellent compliance Controlled substance contract: yes Previous psychiatry evaluation: no Previous medications: no vyvanse (lisdexamfethamine)   Taking meds on weekends/vacations: yes Work/school performance:  good Difficulty sustaining attention/completing tasks: no Distracted by extraneous stimuli: no Does not listen when spoken to: no  Fidgets with hands or feet: no Unable to stay in seat: no Blurts out/interrupts others: no ADHD Medication Side Effects: no    Decreased appetite: no    Headache: no    Sleeping disturbance pattern: no    Irritability: no    Rebound effects (worse than baseline) off medication: no    Anxiousness: no    Dizziness: no    Tics: no  ANXIETY/STRESS Duration:controlled- denies concerns regarding anxiety today. Well controlled with current medication. Anxious mood: yes  Excessive worrying: yes Irritability: no  Sweating: no Nausea: no Palpitations:no Hyperventilation: no Panic attacks: no Agoraphobia: no  Obscessions/compulsions: no Depressed mood: no    06/02/2022    3:41 PM 03/09/2022    1:27 PM 11/11/2021   10:41 AM 11/26/2020   11:13 AM 05/15/2020    8:46 AM  Depression screen PHQ 2/9  Decreased Interest 0 0 0 0 0  Down, Depressed, Hopeless 0 0 0 0 0  PHQ - 2 Score 0 0 0 0 0  Altered sleeping 1 2 3  0 2  Tired, decreased energy 0 0 1 0 0  Change in appetite 0 0 1 0 0  Feeling bad or failure about yourself  0 0 0 0 0  Trouble  concentrating 0 1 0 0 0  Moving slowly or fidgety/restless 0 0 0 0 0  Suicidal thoughts 0 0 0 0 0  PHQ-9 Score 1 3 5  0 2  Difficult doing work/chores Not difficult at all Not difficult at all Somewhat difficult Not difficult at all    Anhedonia: no Weight changes: no Insomnia: no hard to stay asleep  Hypersomnia: no Fatigue/loss of energy: yes Feelings of worthlessness: no Feelings of guilt: no Impaired concentration/indecisiveness: no Suicidal ideations: no  Crying spells: no Recent Stressors/Life Changes: no   Relationship problems: no   Family stress: no     Financial stress: no    Job stress: no    Recent death/loss: no  Patient states she was treated for a sinus infection recently. Finished her antibiotics on Sunday.  Still having pressure and symptoms.   Relevant past medical, surgical, family and social history reviewed and updated as indicated. Interim medical history since our last visit reviewed. Allergies and medications reviewed and updated.  Review of Systems  Eyes:  Negative for visual disturbance.  Respiratory:  Negative for cough, chest tightness and shortness of breath.   Cardiovascular:  Negative for chest pain, palpitations and leg swelling.  Neurological:  Negative for dizziness and headaches.  Psychiatric/Behavioral:  Positive for decreased concentration. The patient is nervous/anxious.     Per HPI unless specifically indicated  above     Objective:    There were no vitals taken for this visit.  Wt Readings from Last 3 Encounters:  03/09/22 220 lb (99.8 kg)  12/09/21 223 lb 6.4 oz (101.3 kg)  11/11/21 218 lb 6.4 oz (99.1 kg)    Physical Exam Vitals and nursing note reviewed.  HENT:     Head: Normocephalic.     Right Ear: Hearing normal.     Left Ear: Hearing normal.     Nose: Nose normal.  Eyes:     Pupils: Pupils are equal, round, and reactive to light.  Pulmonary:     Effort: Pulmonary effort is normal. No respiratory distress.   Neurological:     Mental Status: She is alert.  Psychiatric:        Mood and Affect: Mood normal.        Behavior: Behavior normal.        Thought Content: Thought content normal.        Judgment: Judgment normal.     Results for orders placed or performed in visit on 03/09/22  TSH  Result Value Ref Range   TSH 1.730 0.450 - 4.500 uIU/mL  Lipid panel  Result Value Ref Range   Cholesterol, Total 165 100 - 199 mg/dL   Triglycerides 89 0 - 149 mg/dL   HDL 59 >39 mg/dL   VLDL Cholesterol Cal 16 5 - 40 mg/dL   LDL Chol Calc (NIH) 90 0 - 99 mg/dL   Chol/HDL Ratio 2.8 0.0 - 4.4 ratio  CBC with Differential/Platelet  Result Value Ref Range   WBC 7.0 3.4 - 10.8 x10E3/uL   RBC 5.12 3.77 - 5.28 x10E6/uL   Hemoglobin 15.0 11.1 - 15.9 g/dL   Hematocrit 44.2 34.0 - 46.6 %   MCV 86 79 - 97 fL   MCH 29.3 26.6 - 33.0 pg   MCHC 33.9 31.5 - 35.7 g/dL   RDW 12.4 11.7 - 15.4 %   Platelets 273 150 - 450 x10E3/uL   Neutrophils 54 Not Estab. %   Lymphs 37 Not Estab. %   Monocytes 8 Not Estab. %   Eos 1 Not Estab. %   Basos 0 Not Estab. %   Neutrophils Absolute 3.7 1.4 - 7.0 x10E3/uL   Lymphocytes Absolute 2.6 0.7 - 3.1 x10E3/uL   Monocytes Absolute 0.6 0.1 - 0.9 x10E3/uL   EOS (ABSOLUTE) 0.1 0.0 - 0.4 x10E3/uL   Basophils Absolute 0.0 0.0 - 0.2 x10E3/uL   Immature Granulocytes 0 Not Estab. %   Immature Grans (Abs) 0.0 0.0 - 0.1 x10E3/uL  Comprehensive metabolic panel  Result Value Ref Range   Glucose 86 70 - 99 mg/dL   BUN 11 6 - 24 mg/dL   Creatinine, Ser 0.81 0.57 - 1.00 mg/dL   eGFR 89 >59 mL/min/1.73   BUN/Creatinine Ratio 14 9 - 23   Sodium 138 134 - 144 mmol/L   Potassium 4.3 3.5 - 5.2 mmol/L   Chloride 103 96 - 106 mmol/L   CO2 22 20 - 29 mmol/L   Calcium 9.4 8.7 - 10.2 mg/dL   Total Protein 6.3 6.0 - 8.5 g/dL   Albumin 4.5 3.8 - 4.8 g/dL   Globulin, Total 1.8 1.5 - 4.5 g/dL   Albumin/Globulin Ratio 2.5 (H) 1.2 - 2.2   Bilirubin Total 0.4 0.0 - 1.2 mg/dL   Alkaline  Phosphatase 64 44 - 121 IU/L   AST 14 0 - 40 IU/L   ALT 17 0 - 32  IU/L  Urinalysis, Routine w reflex microscopic  Result Value Ref Range   Specific Gravity, UA 1.020 1.005 - 1.030   pH, UA 7.0 5.0 - 7.5   Color, UA Yellow Yellow   Appearance Ur Clear Clear   Leukocytes,UA Negative Negative   Protein,UA Negative Negative/Trace   Glucose, UA Negative Negative   Ketones, UA Negative Negative   RBC, UA Negative Negative   Bilirubin, UA Negative Negative   Urobilinogen, Ur 0.2 0.2 - 1.0 mg/dL   Nitrite, UA Negative Negative      Assessment & Plan:   Problem List Items Addressed This Visit       Other   Attention deficit hyperactivity disorder    Chronic.  Under good control on current regimen of Vyvanse 58m daily.  Refill sent today.  Controlled substance agreement and UDS up to date.  Continue current regimen. Continue to monitor. Call with any concerns.  Follow up 3 months.        Anxiety - Primary    Chronic.  Controlled.  Continue with current medication regimen.  Aware of risks of long term Benzodiazepine use.  Okay with continuing medication.  Refills sent today.  Controlled substance agreement and UDS up to date.  Return to clinic in 3 months for reevaluation.  Call sooner if concerns arise.        Relevant Medications   LORazepam (ATIVAN) 1 MG tablet (Start on 06/09/2022)     Follow up plan: Return in about 3 months (around 09/02/2022) for ADHD FU.   This visit was completed via MyChart due to the restrictions of the COVID-19 pandemic. All issues as above were discussed and addressed. Physical exam was done as above through visual confirmation on MyChart. If it was felt that the patient should be evaluated in the office, they were directed there. The patient verbally consented to this visit. Location of the patient: Car Location of the provider: Office Those involved with this call:  Provider: KJon Billings NP CMA: VValinda Hoar CMcAdooDesk/Registration:  ILynnell CatalanThis encounter was conducted via video.  I spent 20 dedicated to the care of this patient on the date of this encounter to include previsit review of symptoms, medications, and follow up, face to face time with the patient, and post visit ordering of testing.

## 2022-06-02 NOTE — Assessment & Plan Note (Signed)
Chronic.  Controlled.  Continue with current medication regimen.  Aware of risks of long term Benzodiazepine use.  Okay with continuing medication.  Refills sent today.  Controlled substance agreement and UDS up to date.  Return to clinic in 3 months for reevaluation.  Call sooner if concerns arise.

## 2022-06-06 ENCOUNTER — Ambulatory Visit: Payer: BC Managed Care – PPO | Admitting: Nurse Practitioner

## 2022-06-11 IMAGING — MG MM DIGITAL SCREENING BILAT W/ TOMO AND CAD
6 of 10 series · 6 of 30 positions shown · non-contrast
Comparison: Previous exam(s).

CLINICAL DATA: Screening.

EXAM:
DIGITAL SCREENING BILATERAL MAMMOGRAM WITH TOMOSYNTHESIS AND CAD
TECHNIQUE: Bilateral screening digital craniocaudal and mediolateral oblique
mammograms were obtained. Bilateral screening digital breast
tomosynthesis was performed. The images were evaluated with
computer-aided detection.

[L CC synth-2D (1 of 2)]
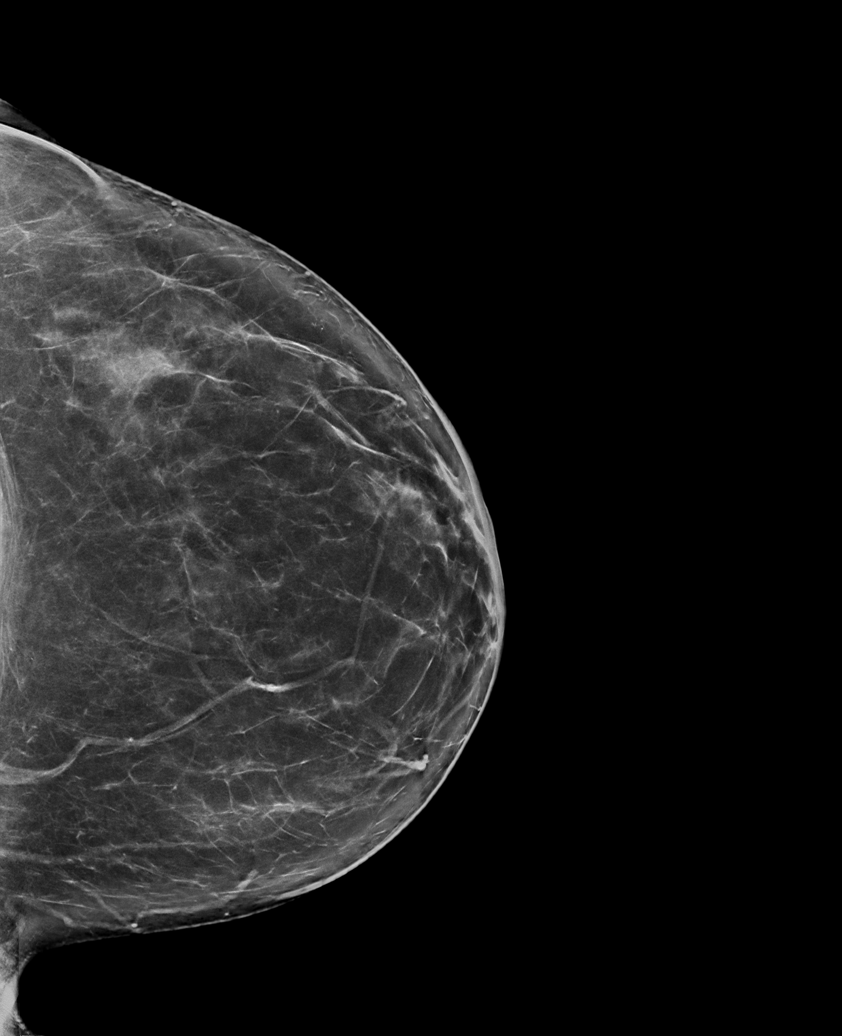

[L CC synth-2D (2 of 2)]
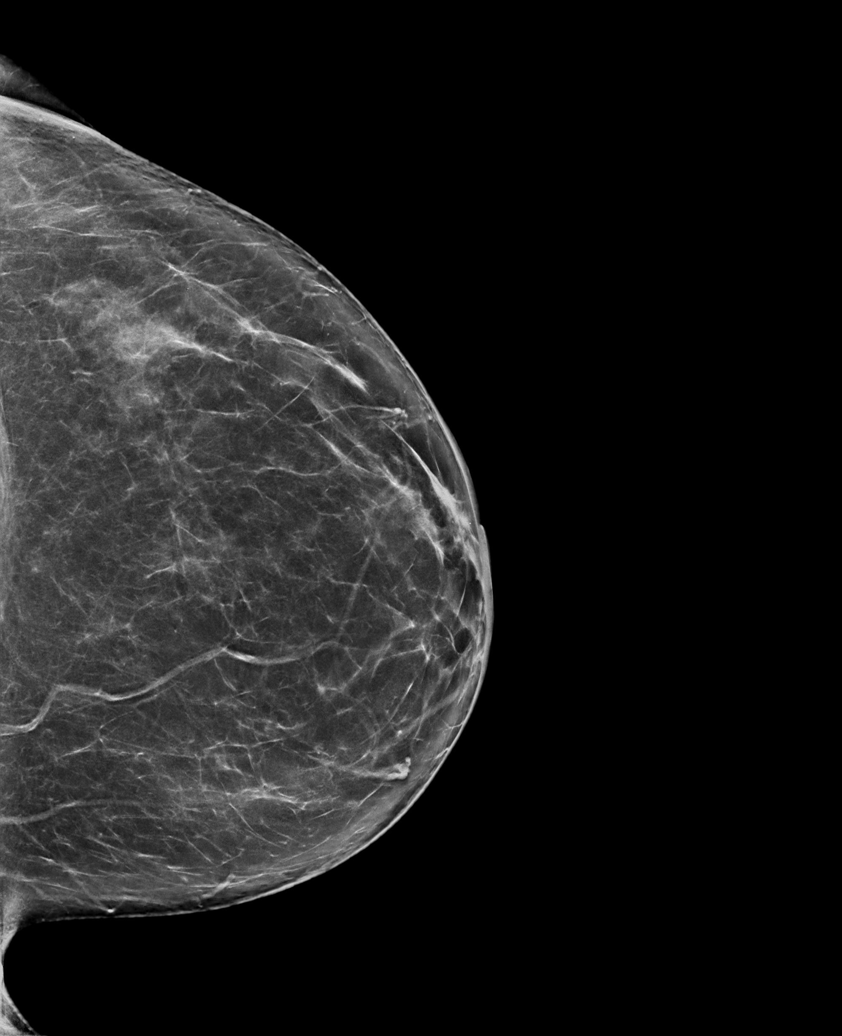

[R CC synth-2D]
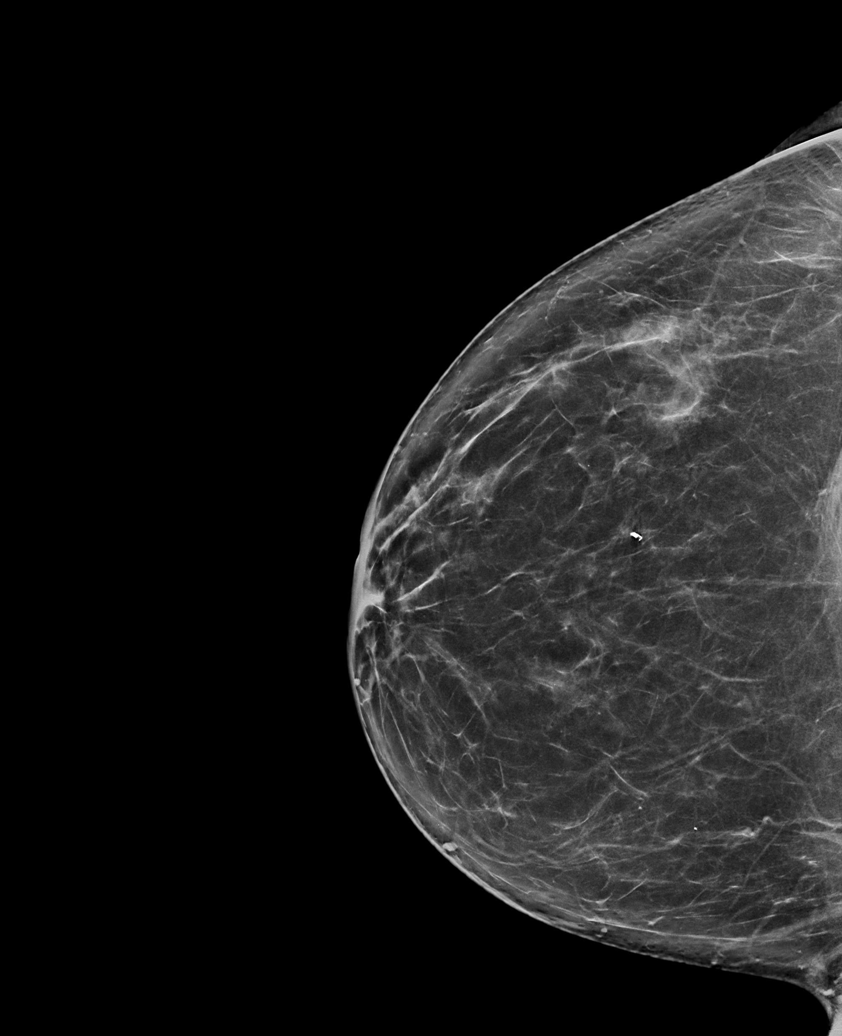

[R MLO synth-2D]
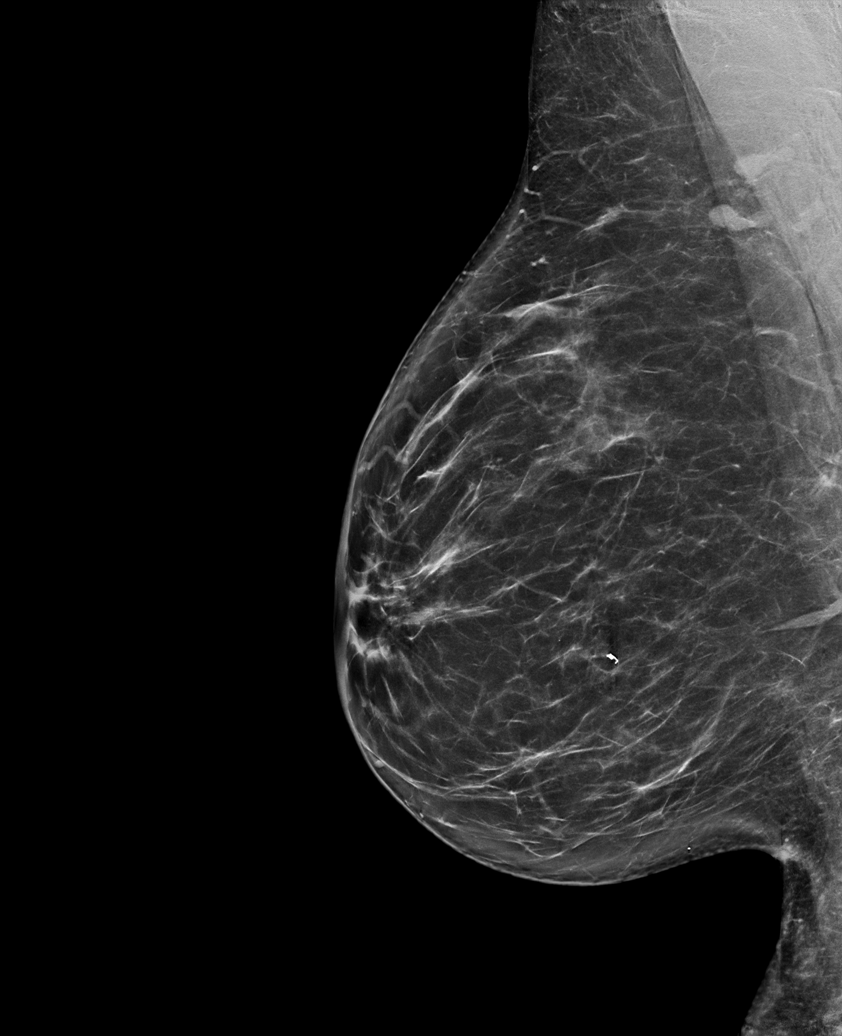

[L MLO synth-2D]
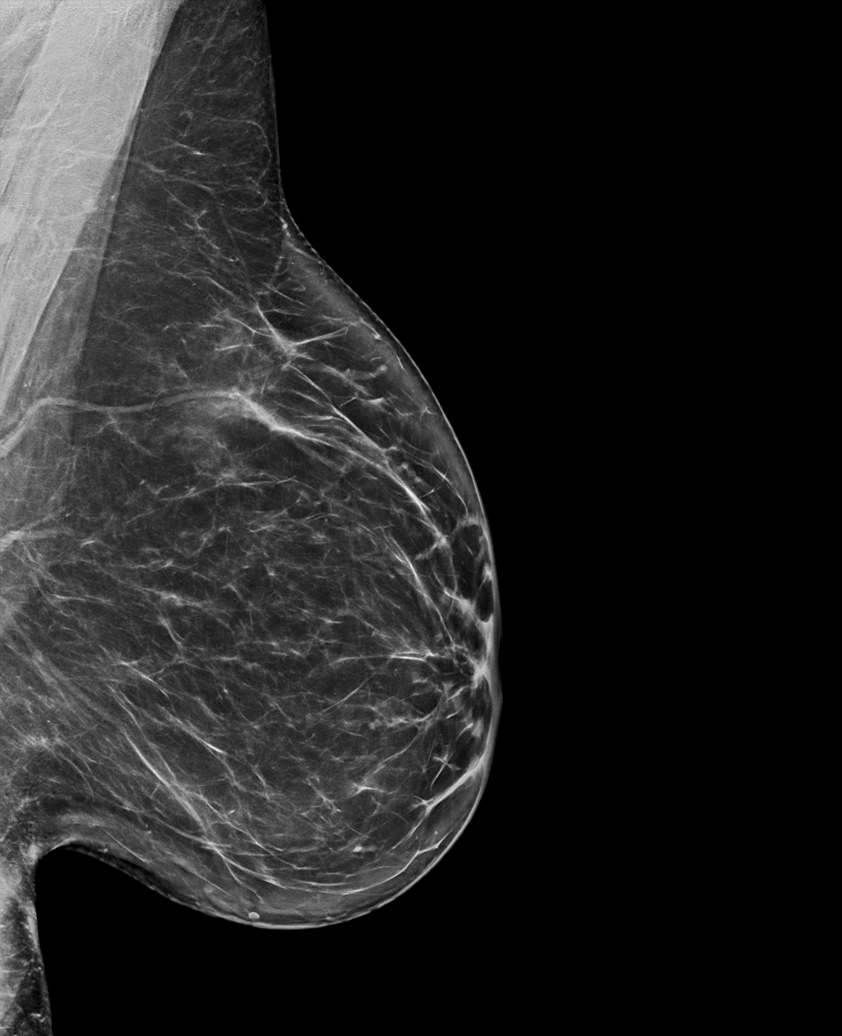

[R MLO tomo · tomo slice 43/86.0]
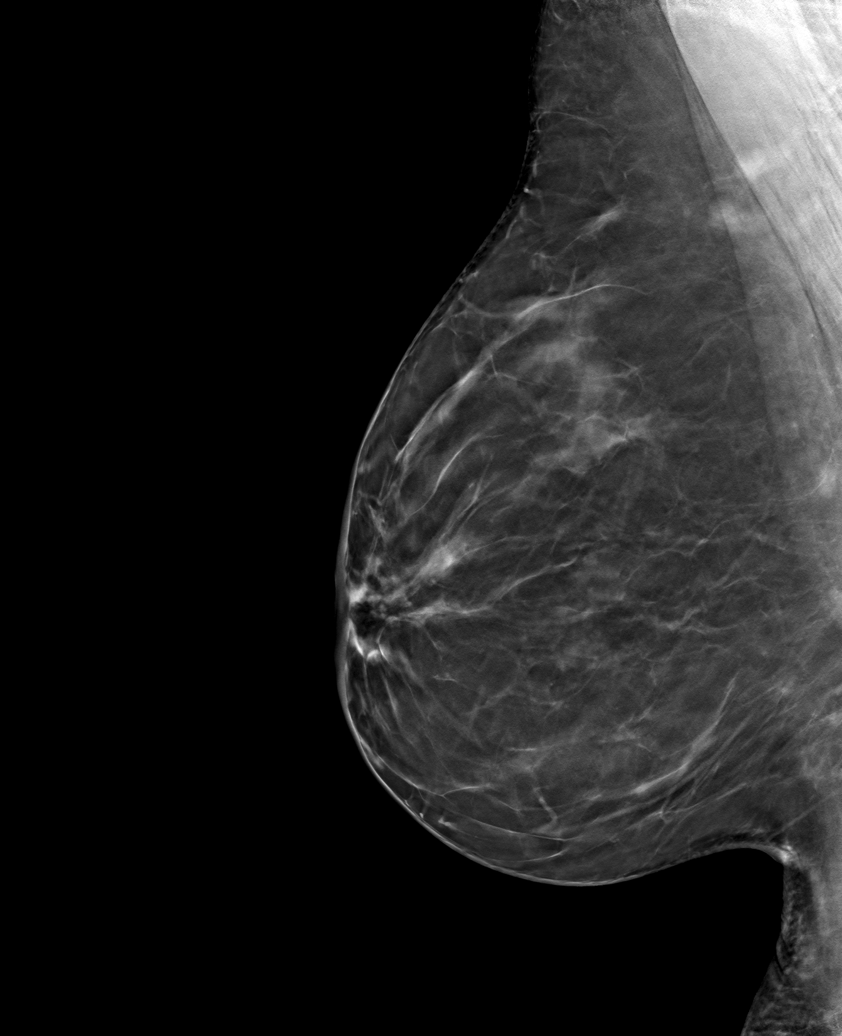

[6 of 30 positions shown; findings below may reference images not displayed]

ACR Breast Density Category b: There are scattered areas of
fibroglandular density.
FINDINGS: There are no findings suspicious for malignancy.
IMPRESSION: No mammographic evidence of malignancy. A result letter of this
screening mammogram will be mailed directly to the patient.

RECOMMENDATION:
Screening mammogram in one year. (Code:51-O-LD2)

BI-RADS CATEGORY  1: Negative.

## 2022-07-08 ENCOUNTER — Other Ambulatory Visit: Payer: Self-pay | Admitting: Nurse Practitioner

## 2022-07-08 MED ORDER — LORAZEPAM 1 MG PO TABS: ORAL_TABLET | ORAL | 0 refills | Status: DC

## 2022-07-08 MED ORDER — LISDEXAMFETAMINE DIMESYLATE 50 MG PO CAPS: 50.0000 mg | ORAL_CAPSULE | Freq: Every day | ORAL | 0 refills | Status: DC

## 2022-07-08 NOTE — Telephone Encounter (Signed)
LOV 06/02/22 virtual  No future appt

## 2022-08-09 ENCOUNTER — Other Ambulatory Visit: Payer: Self-pay | Admitting: Nurse Practitioner

## 2022-08-09 MED ORDER — LISDEXAMFETAMINE DIMESYLATE 50 MG PO CAPS: 50.0000 mg | ORAL_CAPSULE | Freq: Every day | ORAL | 0 refills | Status: DC

## 2022-08-09 MED ORDER — IBUPROFEN 800 MG PO TABS: 800.0000 mg | ORAL_TABLET | Freq: Three times a day (TID) | ORAL | 0 refills | Status: DC | PRN

## 2022-08-09 MED ORDER — LORAZEPAM 1 MG PO TABS: ORAL_TABLET | ORAL | 0 refills | Status: DC

## 2022-08-22 ENCOUNTER — Other Ambulatory Visit: Payer: Self-pay | Admitting: Nurse Practitioner

## 2022-08-23 ENCOUNTER — Other Ambulatory Visit: Payer: Self-pay | Admitting: Nurse Practitioner

## 2022-08-23 NOTE — Telephone Encounter (Signed)
Requested Prescriptions  Pending Prescriptions Disp Refills  . azelastine (ASTELIN) 0.1 % nasal spray [Pharmacy Med Name: AZELASTINE 0.1%(137MCG) NASAL-200SP] 30 mL 0    Sig: USE 1 TO 2 SPRAYS IN EACH NOSTRIL TWICE DAILY     Ear, Nose, and Throat: Nasal Preparations - Antiallergy Passed - 08/22/2022  3:23 PM      Passed - Valid encounter within last 12 months    Recent Outpatient Visits          2 months ago [place], NP   5 months ago Annual physical exam   [facility] Vigg, Avanti, MD   9 months ago [place], NP   10 months ago Acute non-recurrent sinusitis, unspecified location   [facility] Vigg, Avanti, MD   1 year ago Attention deficit hyperactivity disorder (ADHD), unspecified ADHD type   [facility] Jon Billings, NP

## 2022-08-24 NOTE — Telephone Encounter (Signed)
Unable to refill per protocol, last refill by provider 08/23/22 for 30 mL. Will refuse duplicate request.  Requested Prescriptions  Pending Prescriptions Disp Refills  . azelastine (ASTELIN) 0.1 % nasal spray [Pharmacy Med Name: AZELASTINE 0.1%(137MCG) NASAL-200SP] 90 mL 0    Sig: USE 1 TO 2 SPRAYS IN EACH NOSTRIL TWICE DAILY     Ear, Nose, and Throat: Nasal Preparations - Antiallergy Passed - 08/23/2022  2:59 PM      Passed - Valid encounter within last 12 months    Recent Outpatient Visits          2 months ago [place], NP   5 months ago Annual physical exam   [facility] Vigg, Avanti, MD   9 months ago [place], NP   10 months ago Acute non-recurrent sinusitis, unspecified location   [facility] Vigg, Avanti, MD   1 year ago Attention deficit hyperactivity disorder (ADHD), unspecified ADHD type   [facility] Jon Billings, NP

## 2022-09-06 ENCOUNTER — Other Ambulatory Visit: Payer: Self-pay | Admitting: Nurse Practitioner

## 2022-09-07 ENCOUNTER — Other Ambulatory Visit: Payer: Self-pay

## 2022-09-07 NOTE — Telephone Encounter (Signed)
Pt requesting refill for lorazepam 1 mg filled on 08/09/2022 #30  0 RF, also req vyvanse filled on 08/09/2022 #30 0 RF. Last seen on 06/02/2022, no follow up scheduled please advise.

## 2022-09-08 ENCOUNTER — Encounter: Payer: Self-pay | Admitting: Nurse Practitioner

## 2022-09-08 ENCOUNTER — Other Ambulatory Visit: Payer: Self-pay | Admitting: Nurse Practitioner

## 2022-09-12 ENCOUNTER — Ambulatory Visit (INDEPENDENT_AMBULATORY_CARE_PROVIDER_SITE_OTHER): Payer: BC Managed Care – PPO | Admitting: Nurse Practitioner

## 2022-09-12 ENCOUNTER — Encounter: Payer: Self-pay | Admitting: Nurse Practitioner

## 2022-09-12 VITALS — BP 130/88 | HR 77 | Temp 98.7°F | Wt 227.0 lb

## 2022-09-12 DIAGNOSIS — F909 Attention-deficit hyperactivity disorder, unspecified type: Secondary | ICD-10-CM

## 2022-09-12 DIAGNOSIS — F419 Anxiety disorder, unspecified: Secondary | ICD-10-CM

## 2022-09-12 MED ORDER — LORAZEPAM 1 MG PO TABS: ORAL_TABLET | ORAL | 0 refills | Status: DC

## 2022-09-12 MED ORDER — LISDEXAMFETAMINE DIMESYLATE 50 MG PO CAPS: 50.0000 mg | ORAL_CAPSULE | Freq: Every day | ORAL | 0 refills | Status: DC

## 2022-09-12 NOTE — Progress Notes (Unsigned)
BP 130/88   Pulse 77   Temp 98.7 F (37.1 C) (Oral)   Wt 227 lb (103 kg)   SpO2 97%   BMI 37.77 kg/m    Subjective:    Patient ID: Tammy Mays, female    DOB: 10-06-1976, 46 y.o.   MRN: [id number]  HPI: Tammy Mays is a 46 y.o. female  Chief Complaint  Patient presents with   ADHD   Anxiety    3 month follow up    Shoulder Pain     L shoulder pain, onset 2 weeks. Denies recent fall/injury      ADHD FOLLOW UP ADHD status: stable- well controlled. Satisfied with current therapy: yes Medication compliance:  excellent compliance Controlled substance contract: yes Previous psychiatry evaluation: no Previous medications: no vyvanse (lisdexamfethamine)   Taking meds on weekends/vacations: yes Work/school performance:  good Difficulty sustaining attention/completing tasks: no Distracted by extraneous stimuli: no Does not listen when spoken to: no  Fidgets with hands or feet: no Unable to stay in seat: no Blurts out/interrupts others: no ADHD Medication Side Effects: no    Decreased appetite: no    Headache: no    Sleeping disturbance pattern: no    Irritability: no    Rebound effects (worse than baseline) off medication: no    Anxiousness: no    Dizziness: no    Tics: no  ANXIETY/STRESS Duration:controlled- denies concerns regarding anxiety today. Well controlled with current medication. Anxious mood: yes  Excessive worrying: yes Irritability: no  Sweating: no Nausea: no Palpitations:no Hyperventilation: no Panic attacks: no Agoraphobia: no  Obscessions/compulsions: no Depressed mood: no    09/12/2022    4:53 PM 06/02/2022    3:41 PM 03/09/2022    1:27 PM 11/11/2021   10:41 AM 11/26/2020   11:13 AM  Depression screen PHQ 2/9  Decreased Interest 0 0 0 0 0  Down, Depressed, Hopeless 0 0 0 0 0  PHQ - 2 Score 0 0 0 0 0  Altered sleeping _0 0  Tired, decreased energy 0 0 0 1 0  Change in appetite 0 0 0 1 0  Feeling bad or failure about yourself  0  0 0 0 0  Trouble concentrating 1 0 1 0 0  Moving slowly or fidgety/restless 0 0 0 0 0  Suicidal thoughts 0 0 0 0 0  PHQ-9 Score _1 0  Difficult doing work/chores Somewhat difficult Not difficult at all Not difficult at all Somewhat difficult Not difficult at all   Anhedonia: no Weight changes: no Insomnia: no hard to stay asleep  Hypersomnia: no Fatigue/loss of energy: yes Feelings of worthlessness: no Feelings of guilt: no Impaired concentration/indecisiveness: no Suicidal ideations: no  Crying spells: no Recent Stressors/Life Changes: no   Relationship problems: no   Family stress: no     Financial stress: no    Job stress: no    Recent death/loss: no  Patient states she was treated for a sinus infection recently. Finished her antibiotics on Sunday.  Still having pressure and symptoms.   Relevant past medical, surgical, family and social history reviewed and updated as indicated. Interim medical history since our last visit reviewed. Allergies and medications reviewed and updated.  Review of Systems  Eyes:  Negative for visual disturbance.  Respiratory:  Negative for cough, chest tightness and shortness of breath.   Cardiovascular:  Negative for chest pain, palpitations and leg swelling.  Neurological:  Negative for dizziness and  headaches.  Psychiatric/Behavioral:  Positive for decreased concentration. The patient is nervous/anxious.     Per HPI unless specifically indicated above     Objective:    BP 130/88   Pulse 77   Temp 98.7 F (37.1 C) (Oral)   Wt 227 lb (103 kg)   SpO2 97%   BMI 37.77 kg/m   Wt Readings from Last 3 Encounters:  09/12/22 227 lb (103 kg)  03/09/22 220 lb (99.8 kg)  12/09/21 223 lb 6.4 oz (101.3 kg)    Physical Exam Vitals and nursing note reviewed.  Constitutional:      General: She is not in acute distress.    Appearance: Normal appearance. She is normal weight. She is not ill-appearing, toxic-appearing or diaphoretic.  HENT:      Head: Normocephalic.     Right Ear: External ear normal.     Left Ear: External ear normal.     Nose: Nose normal.     Mouth/Throat:     Mouth: Mucous membranes are moist.     Pharynx: Oropharynx is clear.  Eyes:     General:        Right eye: No discharge.        Left eye: No discharge.     Extraocular Movements: Extraocular movements intact.     Conjunctiva/sclera: Conjunctivae normal.     Pupils: Pupils are equal, round, and reactive to light.  Cardiovascular:     Rate and Rhythm: Normal rate and regular rhythm.     Heart sounds: No murmur heard. Pulmonary:     Effort: Pulmonary effort is normal. No respiratory distress.     Breath sounds: Normal breath sounds. No wheezing or rales.  Musculoskeletal:     Cervical back: Normal range of motion and neck supple.  Skin:    General: Skin is warm and dry.     Capillary Refill: Capillary refill takes less than 2 seconds.  Neurological:     General: No focal deficit present.     Mental Status: She is alert and oriented to person, place, and time. Mental status is at baseline.  Psychiatric:        Mood and Affect: Mood normal.        Behavior: Behavior normal.        Thought Content: Thought content normal.        Judgment: Judgment normal.     Results for orders placed or performed in visit on 03/09/22  TSH  Result Value Ref Range   TSH 1.730 0.450 - 4.500 uIU/mL  Lipid panel  Result Value Ref Range   Cholesterol, Total 165 100 - 199 mg/dL   Triglycerides 89 0 - 149 mg/dL   HDL 59 >39 mg/dL   VLDL Cholesterol Cal 16 5 - 40 mg/dL   LDL Chol Calc (NIH) 90 0 - 99 mg/dL   Chol/HDL Ratio 2.8 0.0 - 4.4 ratio  CBC with Differential/Platelet  Result Value Ref Range   WBC 7.0 3.4 - 10.8 x10E3/uL   RBC 5.12 3.77 - 5.28 x10E6/uL   Hemoglobin 15.0 11.1 - 15.9 g/dL   Hematocrit 44.2 34.0 - 46.6 %   MCV 86 79 - 97 fL   MCH 29.3 26.6 - 33.0 pg   MCHC 33.9 31.5 - 35.7 g/dL   RDW 12.4 11.7 - 15.4 %   Platelets 273 150 - 450  x10E3/uL   Neutrophils 54 Not Estab. %   Lymphs 37 Not Estab. %   Monocytes 8 Not  Estab. %   Eos 1 Not Estab. %   Basos 0 Not Estab. %   Neutrophils Absolute 3.7 1.4 - 7.0 x10E3/uL   Lymphocytes Absolute 2.6 0.7 - 3.1 x10E3/uL   Monocytes Absolute 0.6 0.1 - 0.9 x10E3/uL   EOS (ABSOLUTE) 0.1 0.0 - 0.4 x10E3/uL   Basophils Absolute 0.0 0.0 - 0.2 x10E3/uL   Immature Granulocytes 0 Not Estab. %   Immature Grans (Abs) 0.0 0.0 - 0.1 x10E3/uL  Comprehensive metabolic panel  Result Value Ref Range   Glucose 86 70 - 99 mg/dL   BUN 11 6 - 24 mg/dL   Creatinine, Ser 0.81 0.57 - 1.00 mg/dL   eGFR 89 >59 mL/min/1.73   BUN/Creatinine Ratio 14 9 - 23   Sodium 138 134 - 144 mmol/L   Potassium 4.3 3.5 - 5.2 mmol/L   Chloride 103 96 - 106 mmol/L   CO2 22 20 - 29 mmol/L   Calcium 9.4 8.7 - 10.2 mg/dL   Total Protein 6.3 6.0 - 8.5 g/dL   Albumin 4.5 3.8 - 4.8 g/dL   Globulin, Total 1.8 1.5 - 4.5 g/dL   Albumin/Globulin Ratio 2.5 (H) 1.2 - 2.2   Bilirubin Total 0.4 0.0 - 1.2 mg/dL   Alkaline Phosphatase 64 44 - 121 IU/L   AST 14 0 - 40 IU/L   ALT 17 0 - 32 IU/L  Urinalysis, Routine w reflex microscopic  Result Value Ref Range   Specific Gravity, UA 1.020 1.005 - 1.030   pH, UA 7.0 5.0 - 7.5   Color, UA Yellow Yellow   Appearance Ur Clear Clear   Leukocytes,UA Negative Negative   Protein,UA Negative Negative/Trace   Glucose, UA Negative Negative   Ketones, UA Negative Negative   RBC, UA Negative Negative   Bilirubin, UA Negative Negative   Urobilinogen, Ur 0.2 0.2 - 1.0 mg/dL   Nitrite, UA Negative Negative      Assessment & Plan:   Problem List Items Addressed This Visit       Other   Attention deficit hyperactivity disorder    Chronic.  Under good control on current regimen of Vyvanse 26m daily.  Refill sent today.  Controlled substance agreement and UDS updated today.   Continue current regimen. Continue to monitor. Call with any concerns.  Follow up 3 months.         Relevant Orders   [id number]+Oxyco+Alc+Crt-Bund   Anxiety - Primary    Chronic.  Controlled.  Continue with current medication regimen.  Aware of risks of long term Benzodiazepine use.  Okay with continuing medication.  Refills sent today.  Controlled substance agreement and UDS updated today.  Return to clinic in 3 months for reevaluation.  Call sooner if concerns arise.        Relevant Medications   LORazepam (ATIVAN) 1 MG tablet   Other Relevant Orders   [id number]+Oxyco+Alc+Crt-Bund     Follow up plan: Return in about 3 months (around 12/13/2022) for ADHD FU.

## 2022-09-13 NOTE — Assessment & Plan Note (Signed)
Chronic.  Under good control on current regimen of Vyvanse 50mg  daily.  Refill sent today.  Controlled substance agreement and UDS updated today.   Continue current regimen. Continue to monitor. Call with any concerns.  Follow up 3 months.

## 2022-09-13 NOTE — Assessment & Plan Note (Signed)
Chronic.  Controlled.  Continue with current medication regimen.  Aware of risks of long term Benzodiazepine use.  Okay with continuing medication.  Refills sent today.  Controlled substance agreement and UDS updated today.  Return to clinic in 3 months for reevaluation.  Call sooner if concerns arise.

## 2022-09-15 LAB — DRUG PROFILE 799016
Amphetamine GC/MS Conf: >3000 ng/mL
Amphetamine: POSITIVE — AB
Amphetamines: POSITIVE — AB
Methamphetamine: NEGATIVE

## 2022-09-15 LAB — DRUG SCREEN 764883 11+OXYCO+ALC+CRT-BUND
BENZODIAZ UR QL: NEGATIVE ng/mL
Barbiturate: NEGATIVE ng/mL
Cannabinoid Quant, Ur: NEGATIVE ng/mL
Cocaine (Metabolite): NEGATIVE ng/mL
Creatinine: 73.9 mg/dL (ref 20.0–300.0)
Ethanol: NEGATIVE %
Meperidine: NEGATIVE ng/mL
Methadone Screen, Urine: NEGATIVE ng/mL
OPIATE SCREEN URINE: NEGATIVE ng/mL
Oxycodone/Oxymorphone, Urine: NEGATIVE ng/mL
Phencyclidine: NEGATIVE ng/mL
Propoxyphene: NEGATIVE ng/mL
Tramadol: NEGATIVE ng/mL
pH, Urine: 6.6 (ref 4.5–8.9)

## 2022-10-11 ENCOUNTER — Other Ambulatory Visit: Payer: Self-pay | Admitting: Nurse Practitioner

## 2022-10-12 MED ORDER — LISDEXAMFETAMINE DIMESYLATE 50 MG PO CAPS: 50.0000 mg | ORAL_CAPSULE | Freq: Every day | ORAL | 0 refills | Status: DC

## 2022-10-12 MED ORDER — AZELASTINE HCL 0.1 % NA SOLN: NASAL | 0 refills | Status: DC

## 2022-10-12 MED ORDER — LORAZEPAM 1 MG PO TABS: ORAL_TABLET | ORAL | 0 refills | Status: DC

## 2022-10-15 ENCOUNTER — Other Ambulatory Visit: Payer: Self-pay | Admitting: Nurse Practitioner

## 2022-10-15 ENCOUNTER — Encounter: Payer: Self-pay | Admitting: Nurse Practitioner

## 2022-10-17 MED ORDER — OSELTAMIVIR PHOSPHATE 75 MG PO CAPS: 75.0000 mg | ORAL_CAPSULE | Freq: Every day | ORAL | 0 refills | Status: DC

## 2022-11-11 ENCOUNTER — Other Ambulatory Visit: Payer: Self-pay | Admitting: Nurse Practitioner

## 2022-11-11 ENCOUNTER — Encounter: Payer: Self-pay | Admitting: Nurse Practitioner

## 2022-11-11 MED ORDER — LISDEXAMFETAMINE DIMESYLATE 50 MG PO CAPS: 50.0000 mg | ORAL_CAPSULE | Freq: Every day | ORAL | 0 refills | Status: DC

## 2022-11-11 MED ORDER — LORAZEPAM 1 MG PO TABS: ORAL_TABLET | ORAL | 0 refills | Status: DC

## 2022-11-14 ENCOUNTER — Encounter: Payer: Self-pay | Admitting: Nurse Practitioner

## 2022-11-15 MED ORDER — LISDEXAMFETAMINE DIMESYLATE 50 MG PO CAPS: 50.0000 mg | ORAL_CAPSULE | Freq: Every day | ORAL | 0 refills | Status: DC

## 2022-11-17 MED ORDER — AMPHETAMINE-DEXTROAMPHET ER 10 MG PO CP24: 10.0000 mg | ORAL_CAPSULE | Freq: Every day | ORAL | 0 refills | Status: DC

## 2022-11-17 MED ORDER — AMPHETAMINE-DEXTROAMPHETAMINE 10 MG PO TABS: 10.0000 mg | ORAL_TABLET | Freq: Every day | ORAL | 0 refills | Status: DC

## 2022-12-13 ENCOUNTER — Telehealth (INDEPENDENT_AMBULATORY_CARE_PROVIDER_SITE_OTHER): Payer: BC Managed Care – PPO | Admitting: Nurse Practitioner

## 2022-12-13 ENCOUNTER — Encounter: Payer: Self-pay | Admitting: Nurse Practitioner

## 2022-12-13 DIAGNOSIS — Z1231 Encounter for screening mammogram for malignant neoplasm of breast: Secondary | ICD-10-CM

## 2022-12-13 DIAGNOSIS — F419 Anxiety disorder, unspecified: Secondary | ICD-10-CM | POA: Diagnosis not present

## 2022-12-13 DIAGNOSIS — F909 Attention-deficit hyperactivity disorder, unspecified type: Secondary | ICD-10-CM

## 2022-12-13 MED ORDER — AMPHETAMINE-DEXTROAMPHETAMINE 10 MG PO TABS: 10.0000 mg | ORAL_TABLET | Freq: Every day | ORAL | 0 refills | Status: DC

## 2022-12-13 MED ORDER — LORAZEPAM 1 MG PO TABS: ORAL_TABLET | ORAL | 2 refills | Status: DC

## 2022-12-13 MED ORDER — AMPHETAMINE-DEXTROAMPHET ER 10 MG PO CP24: 10.0000 mg | ORAL_CAPSULE | Freq: Every day | ORAL | 0 refills | Status: DC

## 2022-12-13 NOTE — Assessment & Plan Note (Signed)
Chronic.  Controlled.  Continue with current medication regimen.  Aware of risks of long term Benzodiazepine use.  Okay with continuing medication.  Refills sent today.  PDMP checked.  Controlled substance agreement and UDS up to date.  Return to clinic in 3 months for reevaluation.  Call sooner if concerns arise.

## 2022-12-13 NOTE — Assessment & Plan Note (Signed)
Chronic. Ongoing.  Would like to go back to vyvanse when it is available.  Will continue with adderall at this time.  PDMP checked.  Refill sent in. Follow up in 3 months.  Call sooner if concerns arise.

## 2022-12-13 NOTE — Progress Notes (Signed)
Appointment has been made

## 2022-12-13 NOTE — Progress Notes (Signed)
There were no vitals taken for this visit.   Subjective:    Patient ID: Tammy Mays, female    DOB: June 27, 1976, 47 y.o.   MRN: [id number]  HPI: Tammy Mays is a 47 y.o. female  Chief Complaint  Patient presents with   ADHD    Patient states she would like to discuss going back to Vyvanse if it is in stock.    Anxiety     ADHD FOLLOW UP Patient is currently on adderall due to a shortage. Hse would like to go back to the vyvanse when it is available.  ADHD status: stable- well controlled. Satisfied with current therapy: yes Medication compliance:  excellent compliance Controlled substance contract: yes Previous psychiatry evaluation: no Previous medications: no vyvanse (lisdexamfethamine)   Taking meds on weekends/vacations: yes Work/school performance:  good Difficulty sustaining attention/completing tasks: no Distracted by extraneous stimuli: no Does not listen when spoken to: no  Fidgets with hands or feet: no Unable to stay in seat: no Blurts out/interrupts others: no ADHD Medication Side Effects: no    Decreased appetite: no    Headache: no    Sleeping disturbance pattern: no    Irritability: no    Rebound effects (worse than baseline) off medication: no    Anxiousness: no    Dizziness: no    Tics: no  ANXIETY/STRESS Duration:controlled- denies concerns regarding anxiety today. Well controlled with current medication. Anxious mood: yes  Excessive worrying: yes Irritability: no  Sweating: no Nausea: no Palpitations:no Hyperventilation: no Panic attacks: no Agoraphobia: no  Obscessions/compulsions: no Depressed mood: no    12/13/2022    9:05 AM 09/12/2022    4:53 PM 06/02/2022    3:41 PM 03/09/2022    1:27 PM 11/11/2021   10:41 AM  Depression screen PHQ 2/9  Decreased Interest 0 0 0 0 0  Down, Depressed, Hopeless 0 0 0 0 0  PHQ - 2 Score 0 0 0 0 0  Altered sleeping 1 2 1 2 3  $ Tired, decreased energy 0 0 0 0 1  Change in appetite 0 0 0 0 1  Feeling  bad or failure about yourself  0 0 0 0 0  Trouble concentrating 1 1 0 1 0  Moving slowly or fidgety/restless 0 0 0 0 0  Suicidal thoughts 0 0 0 0 0  PHQ-9 Score 2 3 1 3 5  $ Difficult doing work/chores Not difficult at all Somewhat difficult Not difficult at all Not difficult at all Somewhat difficult   Anhedonia: no Weight changes: no Insomnia: no hard to stay asleep  Hypersomnia: no Fatigue/loss of energy: yes Feelings of worthlessness: no Feelings of guilt: no Impaired concentration/indecisiveness: no Suicidal ideations: no  Crying spells: no Recent Stressors/Life Changes: no   Relationship problems: no   Family stress: no     Financial stress: no    Job stress: no    Recent death/loss: no    Relevant past medical, surgical, family and social history reviewed and updated as indicated. Interim medical history since our last visit reviewed. Allergies and medications reviewed and updated.  Review of Systems  Eyes:  Negative for visual disturbance.  Respiratory:  Negative for cough, chest tightness and shortness of breath.   Cardiovascular:  Negative for chest pain, palpitations and leg swelling.  Neurological:  Negative for dizziness and headaches.  Psychiatric/Behavioral:  Positive for decreased concentration. The patient is nervous/anxious.     Per HPI unless specifically indicated above  Objective:    There were no vitals taken for this visit.  Wt Readings from Last 3 Encounters:  09/12/22 227 lb (103 kg)  03/09/22 220 lb (99.8 kg)  12/09/21 223 lb 6.4 oz (101.3 kg)    Physical Exam Vitals and nursing note reviewed.  Constitutional:      General: She is not in acute distress.    Appearance: Normal appearance. She is normal weight. She is not ill-appearing, toxic-appearing or diaphoretic.  HENT:     Head: Normocephalic.     Right Ear: External ear normal.     Left Ear: External ear normal.     Nose: Nose normal.     Mouth/Throat:     Mouth: Mucous  membranes are moist.     Pharynx: Oropharynx is clear.  Eyes:     General:        Right eye: No discharge.        Left eye: No discharge.     Extraocular Movements: Extraocular movements intact.     Conjunctiva/sclera: Conjunctivae normal.     Pupils: Pupils are equal, round, and reactive to light.  Cardiovascular:     Rate and Rhythm: Normal rate and regular rhythm.     Heart sounds: No murmur heard. Pulmonary:     Effort: Pulmonary effort is normal. No respiratory distress.     Breath sounds: Normal breath sounds. No wheezing or rales.  Musculoskeletal:     Cervical back: Normal range of motion and neck supple.  Skin:    General: Skin is warm and dry.     Capillary Refill: Capillary refill takes less than 2 seconds.  Neurological:     General: No focal deficit present.     Mental Status: She is alert and oriented to person, place, and time. Mental status is at baseline.  Psychiatric:        Mood and Affect: Mood normal.        Behavior: Behavior normal.        Thought Content: Thought content normal.        Judgment: Judgment normal.     Results for orders placed or performed in visit on 09/12/22  [id number]+Oxyco+Alc+Crt-Bund  Result Value Ref Range   Ethanol Negative Cutoff=0.020 %   Amphetamines, Urine See Final Results Cutoff=1000 ng/mL   Barbiturate Negative Cutoff=200 ng/mL   BENZODIAZ UR QL Negative Cutoff=200 ng/mL   Cannabinoid Quant, Ur Negative Cutoff=50 ng/mL   Cocaine (Metabolite) Negative Cutoff=300 ng/mL   OPIATE SCREEN URINE Negative Cutoff=300 ng/mL   Oxycodone/Oxymorphone, Urine Negative Cutoff=300 ng/mL   Phencyclidine Negative Cutoff=25 ng/mL   Methadone Screen, Urine Negative Cutoff=300 ng/mL   Propoxyphene Negative Cutoff=300 ng/mL   Meperidine Negative Cutoff=200 ng/mL   Tramadol Negative Cutoff=200 ng/mL   Creatinine 73.9 20.0 - 300.0 mg/dL   pH, Urine 6.6 4.5 - 8.9  Drug Profile [phone]  Result Value Ref Range   Amphetamines Positive (A)  Cutoff=1000   Amphetamine Positive (A)    Amphetamine GC/MS Conf >3000 Cutoff=500 ng/mL   Methamphetamine Negative Cutoff=500      Assessment & Plan:   Problem List Items Addressed This Visit       Other   Attention deficit hyperactivity disorder    Chronic. Ongoing.  Would like to go back to vyvanse when it is available.  Will continue with adderall at this time.  PDMP checked.  Refill sent in. Follow up in 3 months.  Call sooner if concerns arise.  Anxiety    Chronic.  Controlled.  Continue with current medication regimen.  Aware of risks of long term Benzodiazepine use.  Okay with continuing medication.  Refills sent today.  PDMP checked.  Controlled substance agreement and UDS up to date.  Return to clinic in 3 months for reevaluation.  Call sooner if concerns arise.        Relevant Medications   LORazepam (ATIVAN) 1 MG tablet   Other Visit Diagnoses     Encounter for screening mammogram for malignant neoplasm of breast    -  Primary   Relevant Orders   MM 3D SCREEN BREAST BILATERAL        Follow up plan: Return in about 3 months (around 03/13/2023) for Physical and Fasting labs.   This visit was completed via MyChart due to the restrictions of the COVID-19 pandemic. All issues as above were discussed and addressed. Physical exam was done as above through visual confirmation on MyChart. If it was felt that the patient should be evaluated in the office, they were directed there. The patient verbally consented to this visit. Location of the patient: Home Location of the provider: Office Those involved with this call:  Provider: Jon Billings, NP CMA: Yvonna Alanis, CMA Front Desk/Registration: Lynnell Catalan This encounter was conducted via video.  I spent 20 dedicated to the care of this patient on the date of this encounter to include previsit review of symptoms, plan of care and follow up, face to face time with the patient, and post visit ordering of testing.

## 2022-12-16 ENCOUNTER — Other Ambulatory Visit: Payer: Self-pay | Admitting: Nurse Practitioner

## 2022-12-16 NOTE — Telephone Encounter (Signed)
Requested Prescriptions  Pending Prescriptions Disp Refills   cetirizine (ZYRTEC) 10 MG tablet [Pharmacy Med Name: CETIRIZINE 10MG TABLETS] 90 tablet 0    Sig: TAKE 1 TABLET(10 MG) BY MOUTH DAILY     Ear, Nose, and Throat:  Antihistamines 2 Passed - 12/16/2022 10:12 AM      Passed - Cr in normal range and within 360 days    Creatinine  Date Value Ref Range Status  09/12/2022 73.9 20.0 - 300.0 mg/dL Final   Creatinine, Ser  Date Value Ref Range Status  03/09/2022 0.81 0.57 - 1.00 mg/dL Final         Passed - Valid encounter within last 12 months    Recent Outpatient Visits           3 days ago Encounter for screening mammogram for malignant neoplasm of breast   [place] Jon Billings, NP   3 months ago [place], NP   6 months ago [place] Jon Billings, NP   9 months ago Annual physical exam   [place] Vigg, Avanti, MD   1 year ago [place] Jon Billings, NP       Future Appointments             In 2 months Jon Billings, NP [facility]             azelastine (ASTELIN) 0.1 % nasal spray [Pharmacy Med Name: AZELASTINE 0.1%(137MCG) NASAL-200SP] 30 mL 0    Sig: INSTILL 1 SPRAY EACH NOSTRIL EVERY DAY AS DIRECTED     Ear, Nose, and Throat: Nasal Preparations - Antiallergy Passed - 12/16/2022 10:12 AM      Passed - Valid encounter within last 12 months    Recent Outpatient Visits           3 days ago Encounter for screening mammogram for malignant neoplasm of breast   [place], NP   3 months ago [place], NP   6 months ago [place], NP   9 months ago Annual physical exam   [place] Vigg, Avanti, MD   1 year ago [place] Jon Billings, NP       Future Appointments             In 2 months Jon Billings, NP [place], [facility]

## 2022-12-19 NOTE — Telephone Encounter (Signed)
Requested Prescriptions  Refused Prescriptions Disp Refills   azelastine (ASTELIN) 0.1 % nasal spray [Pharmacy Med Name: AZELASTINE 0.1%(137MCG) NASAL-200SP] 90 mL     Sig: USE 1 SPRAY IN EACH NOSTRIL EVERY DAY AS DIRECTED     Ear, Nose, and Throat: Nasal Preparations - Antiallergy Passed - 12/16/2022  2:25 PM      Passed - Valid encounter within last 12 months    Recent Outpatient Visits           6 days ago Encounter for screening mammogram for malignant neoplasm of breast   [place], NP   3 months ago [place], NP   6 months ago [place], NP   9 months ago Annual physical exam   [place] Vigg, Avanti, MD   1 year ago [place] Jon Billings, NP       Future Appointments             In 2 months Jon Billings, NP [place], [facility]

## 2023-01-11 ENCOUNTER — Other Ambulatory Visit: Payer: Self-pay | Admitting: Nurse Practitioner

## 2023-01-11 MED ORDER — AMPHETAMINE-DEXTROAMPHET ER 10 MG PO CP24: 10.0000 mg | ORAL_CAPSULE | Freq: Every day | ORAL | 0 refills | Status: DC

## 2023-01-11 MED ORDER — AMPHETAMINE-DEXTROAMPHETAMINE 10 MG PO TABS: 10.0000 mg | ORAL_TABLET | Freq: Every day | ORAL | 0 refills | Status: DC

## 2023-01-11 MED ORDER — IBUPROFEN 800 MG PO TABS: 800.0000 mg | ORAL_TABLET | Freq: Three times a day (TID) | ORAL | 0 refills | Status: DC | PRN

## 2023-01-24 ENCOUNTER — Ambulatory Visit
Admission: RE | Admit: 2023-01-24 | Discharge: 2023-01-24 | Disposition: A | Discharge status: Home / Self Care | Payer: BC Managed Care – PPO | Source: Ambulatory Visit | Attending: Nurse Practitioner | Admitting: Nurse Practitioner

## 2023-01-24 DIAGNOSIS — Z1231 Encounter for screening mammogram for malignant neoplasm of breast: Secondary | ICD-10-CM | POA: Diagnosis not present

## 2023-01-25 NOTE — Progress Notes (Signed)
Please let patient know her Mammogram did not show any evidence of a malignancy.  The recommendation is to repeat the Mammogram in 1 year.  

## 2023-01-26 ENCOUNTER — Telehealth: Payer: Self-pay

## 2023-01-26 ENCOUNTER — Other Ambulatory Visit: Payer: Self-pay | Admitting: Nurse Practitioner

## 2023-01-26 ENCOUNTER — Encounter: Payer: Self-pay | Admitting: Nurse Practitioner

## 2023-01-26 DIAGNOSIS — Z1211 Encounter for screening for malignant neoplasm of colon: Secondary | ICD-10-CM

## 2023-01-26 NOTE — Telephone Encounter (Signed)
Patient will call back she was getting an eye exam.  Thanks, Sharyn Lull, CMA

## 2023-01-30 ENCOUNTER — Other Ambulatory Visit: Payer: Self-pay | Admitting: *Deleted

## 2023-01-30 ENCOUNTER — Telehealth: Payer: Self-pay | Admitting: *Deleted

## 2023-01-30 DIAGNOSIS — Z1211 Encounter for screening for malignant neoplasm of colon: Secondary | ICD-10-CM

## 2023-01-30 NOTE — Telephone Encounter (Signed)
Gastroenterology Pre-Procedure Review  Request Date: 02/27/2023 Requesting Physician: Dr. Allen Norris  PATIENT REVIEW QUESTIONS: The patient responded to the following health history questions as indicated:    1. Are you having any GI issues? no 2. Do you have a personal history of Polyps? no 3. Do you have a family history of Colon Cancer or Polyps? no 4. Diabetes Mellitus? no 5. Joint replacements in the past 12 months?no 6. Major health problems in the past 3 months?no 7. Any artificial heart valves, MVP, or defibrillator?no    MEDICATIONS & ALLERGIES:    Patient reports the following regarding taking any anticoagulation/antiplatelet therapy:   Plavix, Coumadin, Eliquis, Xarelto, Lovenox, Pradaxa, Brilinta, or Effient? no Aspirin? no  Patient confirms/reports the following medications:  Current Outpatient Medications  Medication Sig Dispense Refill   amphetamine-dextroamphetamine (ADDERALL XR) 10 MG 24 hr capsule Take 1 capsule (10 mg total) by mouth daily. 30 capsule 0   amphetamine-dextroamphetamine (ADDERALL) 10 MG tablet Take 1 tablet (10 mg total) by mouth daily. 30 tablet 0   azelastine (ASTELIN) 0.1 % nasal spray INSTILL 1 SPRAY EACH NOSTRIL EVERY DAY AS DIRECTED 30 mL 0   cetirizine (ZYRTEC) 10 MG tablet TAKE 1 TABLET(10 MG) BY MOUTH DAILY 90 tablet 0   fluticasone (FLONASE) 50 MCG/ACT nasal spray SHAKE LIQUID AND USE 2 SPRAYS IN EACH NOSTRIL EVERY DAY 16 g 11   ibuprofen (ADVIL) 800 MG tablet Take 1 tablet (800 mg total) by mouth every 8 (eight) hours as needed. 90 tablet 0   LORazepam (ATIVAN) 1 MG tablet TAKE 1 TABLET(1 MG) BY MOUTH DAILY AS NEEDED FOR ANXIETY 30 tablet 2   No current facility-administered medications for this visit.    Patient confirms/reports the following allergies:  No Known Allergies  No orders of the defined types were placed in this encounter.   AUTHORIZATION INFORMATION Primary Insurance: 1D#: Group #:  Secondary Insurance: 1D#: Group  #:  SCHEDULE INFORMATION: Date: 02/26/2023   Time: Location: [facility]

## 2023-02-11 ENCOUNTER — Other Ambulatory Visit: Payer: Self-pay | Admitting: Nurse Practitioner

## 2023-02-13 MED ORDER — AMPHETAMINE-DEXTROAMPHET ER 10 MG PO CP24: 10.0000 mg | ORAL_CAPSULE | Freq: Every day | ORAL | 0 refills | Status: DC

## 2023-02-13 MED ORDER — AMPHETAMINE-DEXTROAMPHETAMINE 10 MG PO TABS: 10.0000 mg | ORAL_TABLET | Freq: Every day | ORAL | 0 refills | Status: DC

## 2023-02-16 ENCOUNTER — Encounter: Payer: Self-pay | Admitting: Gastroenterology

## 2023-02-20 ENCOUNTER — Encounter: Payer: Self-pay | Admitting: *Deleted

## 2023-02-20 ENCOUNTER — Telehealth: Payer: Self-pay | Admitting: *Deleted

## 2023-02-20 NOTE — Telephone Encounter (Signed)
Per Selena Batten at [facility],  Pateint was on 02/27/2023 and now reschedule to 03/13/2023.  New instructions will be sent.

## 2023-03-07 ENCOUNTER — Other Ambulatory Visit: Payer: Self-pay | Admitting: *Deleted

## 2023-03-07 MED ORDER — NA SULFATE-K SULFATE-MG SULF 17.5-3.13-1.6 GM/177ML PO SOLN: 1.0000 | Freq: Once | ORAL | 0 refills | Status: AC

## 2023-03-09 ENCOUNTER — Ambulatory Visit: Payer: BC Managed Care – PPO | Admitting: Nurse Practitioner

## 2023-03-13 ENCOUNTER — Other Ambulatory Visit: Payer: Self-pay

## 2023-03-13 ENCOUNTER — Encounter: Payer: Self-pay | Admitting: Gastroenterology

## 2023-03-13 ENCOUNTER — Ambulatory Visit
Admission: RE | Admit: 2023-03-13 | Discharge: 2023-03-13 | Disposition: A | Discharge status: Home / Self Care | Payer: BC Managed Care – PPO | Attending: Gastroenterology | Admitting: Gastroenterology

## 2023-03-13 ENCOUNTER — Ambulatory Visit: Payer: BC Managed Care – PPO | Admitting: Anesthesiology

## 2023-03-13 ENCOUNTER — Ambulatory Visit
Admission: RE | Disposition: A | Discharge status: Home / Self Care | Payer: Self-pay | Source: Home / Self Care | Attending: Gastroenterology

## 2023-03-13 ENCOUNTER — Other Ambulatory Visit: Payer: Self-pay | Admitting: Nurse Practitioner

## 2023-03-13 DIAGNOSIS — D123 Benign neoplasm of transverse colon: Secondary | ICD-10-CM | POA: Insufficient documentation

## 2023-03-13 DIAGNOSIS — F909 Attention-deficit hyperactivity disorder, unspecified type: Secondary | ICD-10-CM | POA: Diagnosis not present

## 2023-03-13 DIAGNOSIS — K635 Polyp of colon: Secondary | ICD-10-CM

## 2023-03-13 DIAGNOSIS — K640 First degree hemorrhoids: Secondary | ICD-10-CM | POA: Diagnosis not present

## 2023-03-13 DIAGNOSIS — I10 Essential (primary) hypertension: Secondary | ICD-10-CM | POA: Diagnosis not present

## 2023-03-13 DIAGNOSIS — Z1211 Encounter for screening for malignant neoplasm of colon: Secondary | ICD-10-CM | POA: Diagnosis not present

## 2023-03-13 DIAGNOSIS — K5730 Diverticulosis of large intestine without perforation or abscess without bleeding: Secondary | ICD-10-CM | POA: Diagnosis not present

## 2023-03-13 DIAGNOSIS — Z87891 Personal history of nicotine dependence: Secondary | ICD-10-CM | POA: Insufficient documentation

## 2023-03-13 DIAGNOSIS — F32A Depression, unspecified: Secondary | ICD-10-CM | POA: Insufficient documentation

## 2023-03-13 DIAGNOSIS — F419 Anxiety disorder, unspecified: Secondary | ICD-10-CM | POA: Insufficient documentation

## 2023-03-13 HISTORY — PX: COLONOSCOPY WITH PROPOFOL: SHX5780

## 2023-03-13 HISTORY — DX: Allergy, unspecified, initial encounter: T78.40XA

## 2023-03-13 HISTORY — DX: Presence of external hearing-aid: Z97.4

## 2023-03-13 HISTORY — PX: POLYPECTOMY: SHX5525

## 2023-03-13 LAB — POCT PREGNANCY, URINE: Preg Test, Ur: NEGATIVE

## 2023-03-13 SURGERY — COLONOSCOPY WITH PROPOFOL
Anesthesia: General | Site: Rectum

## 2023-03-13 MED ORDER — SODIUM CHLORIDE 0.9 % IV SOLN: INTRAVENOUS | Status: DC

## 2023-03-13 MED ORDER — LACTATED RINGERS IV SOLN: INTRAVENOUS | Status: DC

## 2023-03-13 MED ORDER — STERILE WATER FOR IRRIGATION IR SOLN
Status: DC | PRN
  Administered 2023-03-13: 100 mL

## 2023-03-13 MED ORDER — LIDOCAINE HCL (CARDIAC) PF 100 MG/5ML IV SOSY
PREFILLED_SYRINGE | INTRAVENOUS | Status: DC | PRN
  Administered 2023-03-13: 40 mg via INTRAVENOUS

## 2023-03-13 MED ORDER — PROPOFOL 10 MG/ML IV BOLUS
INTRAVENOUS | Status: DC | PRN
  Administered 2023-03-13: 50 mg via INTRAVENOUS
  Administered 2023-03-13: 100 mg via INTRAVENOUS
  Administered 2023-03-13 (×2): 50 mg via INTRAVENOUS

## 2023-03-13 SURGICAL SUPPLY — 7 items
FORCEPS BIOP RAD 4 LRG CAP 4 (CUTTING FORCEPS) IMPLANT
GOWN CVR UNV OPN BCK APRN NK (MISCELLANEOUS) ×2 IMPLANT
GOWN ISOL THUMB LOOP REG UNIV (MISCELLANEOUS) ×2
KIT PRC NS LF DISP ENDO (KITS) ×1 IMPLANT
KIT PROCEDURE OLYMPUS (KITS) ×1
MANIFOLD NEPTUNE II (INSTRUMENTS) ×1 IMPLANT
WATER STERILE IRR 250ML POUR (IV SOLUTION) ×1 IMPLANT

## 2023-03-13 NOTE — H&P (Signed)
Midge Minium, MD [facility] [address] [place], [place] [address] Phone: [phone] Fax : [phone]  Primary Care Physician:  Larae Grooms, NP Primary Gastroenterologist:  Dr. Servando Snare  Pre-Procedure History & Physical: HPI:  Tammy Mays is a 47 y.o. female is here for a screening colonoscopy.   Past Medical History:  Diagnosis Date   Abnormal Pap smear of cervix    Allergies    Anxiety    Attention deficit hyperactivity disorder    Depression    Insomnia    Wears hearing aid    Bilateral    Past Surgical History:  Procedure Laterality Date   BREAST BIOPSY Right 08/29/2019   neg, calcs   CERVICAL BIOPSY  W/ LOOP ELECTRODE EXCISION     LEEP     TONSILLECTOMY      Prior to Admission medications   Medication Sig Start Date End Date Taking? Authorizing Provider  amphetamine-dextroamphetamine (ADDERALL XR) 10 MG 24 hr capsule Take 1 capsule (10 mg total) by mouth daily. 02/13/23  Yes Larae Grooms, NP  amphetamine-dextroamphetamine (ADDERALL) 10 MG tablet Take 1 tablet (10 mg total) by mouth daily. 02/13/23  Yes Larae Grooms, NP  Ascorbic Acid (VITAMIN C) 1000 MG tablet Take 1,000 mg by mouth daily.   Yes [provider]  azelastine (ASTELIN) 0.1 % nasal spray INSTILL 1 SPRAY EACH NOSTRIL EVERY DAY AS DIRECTED 12/16/22  Yes Larae Grooms, NP  cetirizine (ZYRTEC) 10 MG tablet TAKE 1 TABLET(10 MG) BY MOUTH DAILY 12/16/22  Yes Larae Grooms, NP  cholecalciferol (VITAMIN D3) 25 MCG (1000 UNIT) tablet Take 1,000 Units by mouth daily.   Yes [provider]  fluticasone (FLONASE) 50 MCG/ACT nasal spray SHAKE LIQUID AND USE 2 SPRAYS IN EACH NOSTRIL EVERY DAY 06/02/22  Yes Larae Grooms, NP  ibuprofen (ADVIL) 800 MG tablet Take 1 tablet (800 mg total) by mouth every 8 (eight) hours as needed. 01/11/23  Yes Larae Grooms, NP  LORazepam (ATIVAN) 1 MG tablet TAKE 1 TABLET(1 MG) BY MOUTH DAILY AS NEEDED FOR ANXIETY 12/13/22  Yes Larae Grooms, NP  vitamin B-12 (CYANOCOBALAMIN) 100 MCG tablet Take 100 mcg by mouth daily.   Yes [provider]  zinc gluconate 50 MG tablet Take 50 mg by mouth daily.   Yes [provider]    Allergies as of 01/30/2023   (No Known Allergies)    Family History  Problem Relation Age of Onset   Hepatitis C Mother    Breast cancer Neg Hx    Cancer Neg Hx    Diabetes Neg Hx    Heart disease Neg Hx    Hypertension Neg Hx    Stroke Neg Hx    COPD Neg Hx     Social History   Socioeconomic History   Marital status: Married    Spouse name: Not on file   Number of children: Not on file   Years of education: Not on file   Highest education level: Not on file  Occupational History   Not on file  Tobacco Use   Smoking status: Former    Types: Cigarettes    Quit date: 06/29/1999    Years since quitting: 23.7   Smokeless tobacco: Never  Vaping Use   Vaping Use: Never used  Substance and Sexual Activity   Alcohol use: Yes    Alcohol/week: 0.0 standard drinks of alcohol    Comment: occasional   Drug use: No   Sexual activity: Yes  Birth control/protection: I.U.D.  Other Topics Concern   Not on file  Social History Narrative   Not on file   Social Determinants of Health   Financial Resource Strain: Not on file  Food Insecurity: Not on file  Transportation Needs: Not on file  Physical Activity: Not on file  Stress: Not on file  Social Connections: Not on file  Intimate Partner Violence: Not on file    Review of Systems: See HPI, otherwise negative ROS  Physical Exam: Ht 5\' 5"  (1.651 m)   Wt 99.3 kg   BMI 36.44 kg/m  General:   Alert,  pleasant and cooperative in NAD Head:  Normocephalic and atraumatic. Neck:  Supple; no masses or thyromegaly. Lungs:  Clear throughout to auscultation.    Heart:  Regular rate and rhythm. Abdomen:  Soft, nontender and nondistended. Normal bowel sounds, without guarding, and without rebound.   Neurologic:  Alert and   oriented x4;  grossly normal neurologically.  Impression/Plan: Tammy Mays is now here to undergo a screening colonoscopy.  Risks, benefits, and alternatives regarding colonoscopy have been reviewed with the patient.  Questions have been answered.  All parties agreeable.

## 2023-03-13 NOTE — Anesthesia Postprocedure Evaluation (Signed)
Anesthesia Post Note  Patient: Tammy Mays  Procedure(s) Performed: COLONOSCOPY WITH BIOPSY (Rectum) POLYPECTOMY (Rectum)  Patient location during evaluation: PACU Anesthesia Type: General Level of consciousness: awake and alert Pain management: pain level controlled Vital Signs Assessment: post-procedure vital signs reviewed and stable Respiratory status: spontaneous breathing, nonlabored ventilation, respiratory function stable and patient connected to nasal cannula oxygen Cardiovascular status: blood pressure returned to baseline and stable Postop Assessment: no apparent nausea or vomiting Anesthetic complications: no   No notable events documented.   Last Vitals:  Vitals:   03/13/23 1015 03/13/23 1016  BP: 120/74   Pulse:    Resp: 17 18  Temp:    SpO2:      Last Pain:  Vitals:   03/13/23 1015  TempSrc:   PainSc: 0-No pain                 Britanie Harshman C Haneefah Venturini

## 2023-03-13 NOTE — Anesthesia Preprocedure Evaluation (Signed)
Anesthesia Evaluation  Patient identified by MRN, date of birth, ID band Patient awake    Reviewed: Allergy & Precautions, H&P , NPO status , Patient's Chart, lab work & pertinent test results  Airway Mallampati: I  TM Distance: >3 FB Neck ROM: Full    Dental no notable dental hx.    Pulmonary neg pulmonary ROS, former smoker   Pulmonary exam normal breath sounds clear to auscultation       Cardiovascular hypertension, Normal cardiovascular exam Rhythm:Regular Rate:Normal     Neuro/Psych   Anxiety Depression    negative neurological ROS  negative psych ROS   GI/Hepatic negative GI ROS, Neg liver ROS,,,  Endo/Other  negative endocrine ROS    Renal/GU negative Renal ROS  negative genitourinary   Musculoskeletal negative musculoskeletal ROS (+)    Abdominal   Peds negative pediatric ROS (+)  Hematology negative hematology ROS (+)   Anesthesia Other Findings Depression  Insomnia Attention deficit hyperactivity disorder Abnormal Pap smear of cervix Anxiety  Allergies Wears hearing aid     Reproductive/Obstetrics negative OB ROS                             Anesthesia Physical Anesthesia Plan  ASA: 2  Anesthesia Plan: General   Post-op Pain Management:    Induction: Intravenous  PONV Risk Score and Plan:   Airway Management Planned: Natural Airway and Nasal Cannula  Additional Equipment:   Intra-op Plan:   Post-operative Plan:   Informed Consent: I have reviewed the patients History and Physical, chart, labs and discussed the procedure including the risks, benefits and alternatives for the proposed anesthesia with the patient or authorized representative who has indicated his/her understanding and acceptance.     Dental Advisory Given  Plan Discussed with: Anesthesiologist, CRNA and Surgeon  Anesthesia Plan Comments: (Patient consented for risks of anesthesia including  but not limited to:  - adverse reactions to medications - risk of airway placement if required - damage to eyes, teeth, lips or other oral mucosa - nerve damage due to positioning  - sore throat or hoarseness - Damage to heart, brain, nerves, lungs, other parts of body or loss of life  Patient voiced understanding.)       Anesthesia Quick Evaluation

## 2023-03-13 NOTE — Op Note (Addendum)
[facility] Gastroenterology Patient Name: Tammy Mays Procedure Date: 03/13/2023 9:42 AM MRN: [id number] Account #: [id number] Date of Birth: [birth year] Admit Type: Outpatient Age: 47 Room: [facility] OR ROOM 01 Gender: Female Note Status: Finalized Instrument Name: [id number] Procedure:             Colonoscopy Indications:           Screening for colorectal malignant neoplasm Providers:             Midge Minium MD, MD Referring MD:          Larae Grooms (Referring MD) Medicines:             Propofol per Anesthesia Complications:         No immediate complications. Procedure:             Pre-Anesthesia Assessment:                        - Prior to the procedure, a History and Physical was                         performed, and patient medications and allergies were                         reviewed. The patient's tolerance of previous                         anesthesia was also reviewed. The risks and benefits                         of the procedure and the sedation options and risks                         were discussed with the patient. All questions were                         answered, and informed consent was obtained. Prior                         Anticoagulants: The patient has taken no anticoagulant                         or antiplatelet agents. ASA Grade Assessment: II - A                         patient with mild systemic disease. After reviewing                         the risks and benefits, the patient was deemed in                         satisfactory condition to undergo the procedure.                        After obtaining informed consent, the colonoscope was                         passed under direct vision. Throughout the procedure,  the patient's blood pressure, pulse, and oxygen                         saturations were monitored continuously. The                         Colonoscope was introduced through the anus and                          advanced to the the cecum, identified by appendiceal                         orifice and ileocecal valve. The colonoscopy was                         performed without difficulty. The patient tolerated                         the procedure well. The quality of the bowel                         preparation was excellent. Findings:      The perianal and digital rectal examinations were normal.      A 3 mm polyp was found in the descending colon. The polyp was sessile.       The polyp was removed with a cold biopsy forceps. Resection and       retrieval were complete.      A few small-mouthed diverticula were found in the sigmoid colon.      Non-bleeding internal hemorrhoids were found during retroflexion. The       hemorrhoids were Grade I (internal hemorrhoids that do not prolapse). Impression:            - One 3 mm polyp in the descending colon, removed with                         a cold biopsy forceps. Resected and retrieved.                        - Diverticulosis in the sigmoid colon.                        - Non-bleeding internal hemorrhoids. Recommendation:        - Discharge patient to home.                        - Resume previous diet.                        - Continue present medications.                        - Await pathology results.                        - If the pathology report reveals adenomatous tissue,                         then repeat the colonoscopy for surveillance in 7  years. Procedure Code(s):     --- Professional ---                        [phone], Colonoscopy, flexible; with biopsy, single or                         multiple Diagnosis Code(s):     --- Professional ---                        Z12.11, Encounter for screening for malignant neoplasm                         of colon                        D12.4, Benign neoplasm of descending colon CPT copyright 2022 American [employer] Association. All rights reserved. The  codes documented in this report are preliminary and upon coder review may  be revised to meet current compliance requirements. Midge Minium MD, MD 03/13/2023 10:06:59 AM This report has been signed electronically. Number of Addenda: 0 Note Initiated On: 03/13/2023 9:42 AM Scope Withdrawal Time: 0 hours 8 minutes 45 seconds  Total Procedure Duration: 0 hours 12 minutes 42 seconds  Estimated Blood Loss:  Estimated blood loss: none.      [facility]

## 2023-03-13 NOTE — Transfer of Care (Signed)
Immediate Anesthesia Transfer of Care Note  Patient: Tammy Mays  Procedure(s) Performed: COLONOSCOPY WITH BIOPSY (Rectum) POLYPECTOMY (Rectum)  Patient Location: PACU  Anesthesia Type: General  Level of Consciousness: awake, alert  and patient cooperative  Airway and Oxygen Therapy: Patient Spontanous Breathing and Patient connected to supplemental oxygen  Post-op Assessment: Post-op Vital signs reviewed, Patient's Cardiovascular Status Stable, Respiratory Function Stable, Patent Airway and No signs of Nausea or vomiting  Post-op Vital Signs: Reviewed and stable  Complications: No notable events documented.

## 2023-03-14 ENCOUNTER — Ambulatory Visit: Payer: BC Managed Care – PPO | Admitting: Nurse Practitioner

## 2023-03-14 ENCOUNTER — Encounter: Payer: Self-pay | Admitting: Gastroenterology

## 2023-03-14 ENCOUNTER — Encounter: Payer: Self-pay | Admitting: Nurse Practitioner

## 2023-03-14 NOTE — Telephone Encounter (Signed)
Requested medication (s) are due for refill today: Yes  Requested medication (s) are on the active medication list: Yes  Last refill:  12/13/22  Future visit scheduled: Yes  Notes to clinic:  See request.    Requested Prescriptions  Pending Prescriptions Disp Refills   LORazepam (ATIVAN) 1 MG tablet [Pharmacy Med Name: LORAZEPAM 1MG  TABLETS] 30 tablet     Sig: TAKE 1 TABLET(1 MG) BY MOUTH DAILY AS NEEDED FOR ANXIETY     Not Delegated - Psychiatry: Anxiolytics/Hypnotics 2 Failed - 03/13/2023 11:57 AM      Failed - This refill cannot be delegated      Passed - Urine Drug Screen completed in last 360 days      Passed - Patient is not pregnant      Passed - Valid encounter within last 6 months    Recent Outpatient Visits           3 months ago Encounter for screening mammogram for malignant neoplasm of breast   [place] [facility] Larae Grooms, NP   6 months ago Anxiety   [place] [facility] Larae Grooms, NP   9 months ago Anxiety   [place] [facility] Larae Grooms, NP   1 year ago Annual physical exam   [place] [facility] Vigg, Avanti, MD   1 year ago Anxiety   [facility] Larae Grooms, NP       Future Appointments             In 2 days Larae Grooms, NP [facility]

## 2023-03-16 ENCOUNTER — Ambulatory Visit (INDEPENDENT_AMBULATORY_CARE_PROVIDER_SITE_OTHER): Payer: BC Managed Care – PPO | Admitting: Nurse Practitioner

## 2023-03-16 ENCOUNTER — Other Ambulatory Visit (HOSPITAL_COMMUNITY)
Admission: RE | Admit: 2023-03-16 | Discharge: 2023-03-16 | Disposition: A | Discharge status: Home / Self Care | Payer: BC Managed Care – PPO | Source: Ambulatory Visit | Attending: Nurse Practitioner | Admitting: Nurse Practitioner

## 2023-03-16 ENCOUNTER — Encounter: Payer: Self-pay | Admitting: Nurse Practitioner

## 2023-03-16 VITALS — BP 138/93 | HR 77 | Temp 98.9°F | Ht 65.35 in | Wt 230.3 lb

## 2023-03-16 DIAGNOSIS — F909 Attention-deficit hyperactivity disorder, unspecified type: Secondary | ICD-10-CM | POA: Diagnosis not present

## 2023-03-16 DIAGNOSIS — F419 Anxiety disorder, unspecified: Secondary | ICD-10-CM

## 2023-03-16 DIAGNOSIS — Z136 Encounter for screening for cardiovascular disorders: Secondary | ICD-10-CM

## 2023-03-16 DIAGNOSIS — Z0000 Encounter for general adult medical examination without abnormal findings: Secondary | ICD-10-CM

## 2023-03-16 MED ORDER — LISDEXAMFETAMINE DIMESYLATE 50 MG PO CAPS: 50.0000 mg | ORAL_CAPSULE | Freq: Every day | ORAL | 0 refills | Status: DC

## 2023-03-16 MED ORDER — LORAZEPAM 1 MG PO TABS: ORAL_TABLET | ORAL | 2 refills | Status: DC

## 2023-03-16 NOTE — Assessment & Plan Note (Signed)
Chronic.  Controlled.  Continue with current medication regimen.  Aware of risks of long term Benzodiazepine use.  Okay with continuing medication.  Refills sent today.  PDMP checked.  Controlled substance agreement and UDS up to date.  Return to clinic in 3 months for reevaluation.  Call sooner if concerns arise.   

## 2023-03-16 NOTE — Assessment & Plan Note (Signed)
Chronic.  Doing okay with Adderall but prefers Vyvanse.  Adderall increases patient's blood pressure.  Will switch back to vyvanse 50mg  daily.  Refills sent for 3 months.  Follow up in 3 months.  Call sooner if concerns arise.

## 2023-03-16 NOTE — Progress Notes (Signed)
BP (!) 138/93   Pulse 77   Temp 98.9 F (37.2 C) (Oral)   Ht 5' 5.35" (1.66 m)   Wt 230 lb 4.8 oz (104.5 kg)   LMP 03/06/2023 (Approximate)   SpO2 99%   BMI 37.91 kg/m    Subjective:    Patient ID: Tammy Mays, female    DOB: [birth year], 47 y.o.   MRN: [id number]  HPI: Tammy Mays is a 47 y.o. female presenting on 03/16/2023 for comprehensive medical examination. Current medical complaints include:none  She currently lives with: Menopausal Symptoms: no  ADHD FOLLOW UP Patient is currently on adderall due to a shortage. Hse would like to go back to the vyvanse when it is available.  ADHD status: stable- well controlled. Satisfied with current therapy: yes Medication compliance:  excellent compliance Controlled substance contract: yes Previous psychiatry evaluation: no Previous medications: no vyvanse (lisdexamfethamine)   Taking meds on weekends/vacations: yes Work/school performance:  good Difficulty sustaining attention/completing tasks: no Distracted by extraneous stimuli: no Does not listen when spoken to: no  Fidgets with hands or feet: no Unable to stay in seat: no Blurts out/interrupts others: no ADHD Medication Side Effects: no    Decreased appetite: no    Headache: no    Sleeping disturbance pattern: no    Irritability: no    Rebound effects (worse than baseline) off medication: no    Anxiousness: no    Dizziness: no    Tics: no   ANXIETY/STRESS Duration:controlled- denies concerns regarding anxiety today. Well controlled with current medication. Anxious mood: yes  Excessive worrying: yes Irritability: no  Sweating: no Nausea: no Palpitations:no Hyperventilation: no Panic attacks: no Agoraphobia: no  Obscessions/compulsions: no Depressed mood: no     12/13/2022    9:05 AM 09/12/2022    4:53 PM 06/02/2022    3:41 PM 03/09/2022    1:27 PM 11/11/2021   10:41 AM  Depression screen PHQ 2/9  Decreased Interest 0 0 0 0 0  Down, Depressed, Hopeless 0  0 0 0 0  PHQ - 2 Score 0 0 0 0 0  Altered sleeping 1 2 1 2 3   Tired, decreased energy 0 0 0 0 1  Change in appetite 0 0 0 0 1  Feeling bad or failure about yourself  0 0 0 0 0  Trouble concentrating 1 1 0 1 0  Moving slowly or fidgety/restless 0 0 0 0 0  Suicidal thoughts 0 0 0 0 0  PHQ-9 Score 2 3 1 3 5   Difficult doing work/chores Not difficult at all Somewhat difficult Not difficult at all Not difficult at all Somewhat difficult    Anhedonia: no Weight changes: no Insomnia: no hard to stay asleep  Hypersomnia: no Fatigue/loss of energy: yes Feelings of worthlessness: no Feelings of guilt: no Impaired concentration/indecisiveness: no Suicidal ideations: no  Crying spells: no Recent Stressors/Life Changes: no   Relationship problems: no   Family stress: no     Financial stress: no    Job stress: no    Recent death/loss: no    Depression Screen done today and results listed below:     03/16/2023    1:18 PM 12/13/2022    9:05 AM 09/12/2022    4:53 PM 06/02/2022    3:41 PM 03/09/2022    1:27 PM  Depression screen PHQ 2/9  Decreased Interest 0 0 0 0 0  Down, Depressed, Hopeless 0 0 0 0 0  PHQ - 2 Score 0 0  0 0 0  Altered sleeping 1 1 2 1 2   Tired, decreased energy 0 0 0 0 0  Change in appetite 0 0 0 0 0  Feeling bad or failure about yourself  0 0 0 0 0  Trouble concentrating 0 1 1 0 1  Moving slowly or fidgety/restless 0 0 0 0 0  Suicidal thoughts 0 0 0 0 0  PHQ-9 Score 1 2 3 1 3   Difficult doing work/chores Not difficult at all Not difficult at all Somewhat difficult Not difficult at all Not difficult at all    The patient does not have a history of falls. I did complete a risk assessment for falls. A plan of care for falls was documented.   Past Medical History:  Past Medical History:  Diagnosis Date   Abnormal Pap smear of cervix    Allergies    Anxiety    Attention deficit hyperactivity disorder    Depression    Insomnia    Wears hearing aid    Bilateral     Surgical History:  Past Surgical History:  Procedure Laterality Date   BREAST BIOPSY Right 08/29/2019   neg, calcs   CERVICAL BIOPSY  W/ LOOP ELECTRODE EXCISION     COLONOSCOPY WITH PROPOFOL N/A 03/13/2023   Procedure: COLONOSCOPY WITH BIOPSY;  Surgeon: Midge Minium, MD;  Location: [facility];  Service: Endoscopy;  Laterality: N/A;   LEEP     POLYPECTOMY N/A 03/13/2023   Procedure: POLYPECTOMY;  Surgeon: Midge Minium, MD;  Location: [facility];  Service: Endoscopy;  Laterality: N/A;   TONSILLECTOMY      Medications:  Current Outpatient Medications on File Prior to Visit  Medication Sig   amphetamine-dextroamphetamine (ADDERALL XR) 10 MG 24 hr capsule Take 1 capsule (10 mg total) by mouth daily.   amphetamine-dextroamphetamine (ADDERALL) 10 MG tablet Take 1 tablet (10 mg total) by mouth daily.   azelastine (ASTELIN) 0.1 % nasal spray INSTILL 1 SPRAY EACH NOSTRIL EVERY DAY AS DIRECTED   cetirizine (ZYRTEC) 10 MG tablet TAKE 1 TABLET(10 MG) BY MOUTH DAILY   fluticasone (FLONASE) 50 MCG/ACT nasal spray SHAKE LIQUID AND USE 2 SPRAYS IN EACH NOSTRIL EVERY DAY   ibuprofen (ADVIL) 800 MG tablet Take 1 tablet (800 mg total) by mouth every 8 (eight) hours as needed.   No current facility-administered medications on file prior to visit.    Allergies:  No Known Allergies  Social History:  Social History   Socioeconomic History   Marital status: Married    Spouse name: Not on file   Number of children: Not on file   Years of education: Not on file   Highest education level: Not on file  Occupational History   Not on file  Tobacco Use   Smoking status: Former    Types: Cigarettes    Quit date: 06/29/1999    Years since quitting: 23.7   Smokeless tobacco: Never  Vaping Use   Vaping Use: Never used  Substance and Sexual Activity   Alcohol use: Yes    Alcohol/week: 0.0 standard drinks of alcohol    Comment: occasional   Drug use: No   Sexual activity: Yes     Birth control/protection: I.U.D.  Other Topics Concern   Not on file  Social History Narrative   Not on file   Social Determinants of Health   Financial Resource Strain: Not on file  Food Insecurity: Not on file  Transportation Needs: Not on file  Physical  Activity: Not on file  Stress: Not on file  Social Connections: Not on file  Intimate Partner Violence: Not on file   Social History   Tobacco Use  Smoking Status Former   Types: Cigarettes   Quit date: 06/29/1999   Years since quitting: 23.7  Smokeless Tobacco Never   Social History   Substance and Sexual Activity  Alcohol Use Yes   Alcohol/week: 0.0 standard drinks of alcohol   Comment: occasional    Family History:  Family History  Problem Relation Age of Onset   Hepatitis C Mother    Breast cancer Neg Hx    Cancer Neg Hx    Diabetes Neg Hx    Heart disease Neg Hx    Hypertension Neg Hx    Stroke Neg Hx    COPD Neg Hx     Past medical history, surgical history, medications, allergies, family history and social history reviewed with patient today and changes made to appropriate areas of the chart.   Review of Systems  Psychiatric/Behavioral:  Negative for depression and suicidal ideas. The patient is nervous/anxious.    All other ROS negative except what is listed above and in the HPI.      Objective:    BP (!) 138/93   Pulse 77   Temp 98.9 F (37.2 C) (Oral)   Ht 5' 5.35" (1.66 m)   Wt 230 lb 4.8 oz (104.5 kg)   LMP 03/06/2023 (Approximate)   SpO2 99%   BMI 37.91 kg/m   Wt Readings from Last 3 Encounters:  03/16/23 230 lb 4.8 oz (104.5 kg)  03/13/23 266 lb (120.7 kg)  09/12/22 227 lb (103 kg)    Physical Exam Vitals and nursing note reviewed. Exam conducted with a chaperone present Wilhemena Durie, CMA).  Constitutional:      General: She is awake. She is not in acute distress.    Appearance: She is well-developed. She is not ill-appearing.  HENT:     Head: Normocephalic and  atraumatic.     Right Ear: Hearing, tympanic membrane, ear canal and external ear normal. No drainage.     Left Ear: Hearing, tympanic membrane, ear canal and external ear normal. No drainage.     Nose: Nose normal.     Right Sinus: No maxillary sinus tenderness or frontal sinus tenderness.     Left Sinus: No maxillary sinus tenderness or frontal sinus tenderness.     Mouth/Throat:     Mouth: Mucous membranes are moist.     Pharynx: Oropharynx is clear. Uvula midline. No pharyngeal swelling, oropharyngeal exudate or posterior oropharyngeal erythema.  Eyes:     General: Lids are normal.        Right eye: No discharge.        Left eye: No discharge.     Extraocular Movements: Extraocular movements intact.     Conjunctiva/sclera: Conjunctivae normal.     Pupils: Pupils are equal, round, and reactive to light.     Visual Fields: Right eye visual fields normal and left eye visual fields normal.  Neck:     Thyroid: No thyromegaly.     Vascular: No carotid bruit.     Trachea: Trachea normal.  Cardiovascular:     Rate and Rhythm: Normal rate and regular rhythm.     Heart sounds: Normal heart sounds. No murmur heard.    No gallop.  Pulmonary:     Effort: Pulmonary effort is normal. No accessory muscle usage or respiratory distress.  Breath sounds: Normal breath sounds.  Chest:  Breasts:    Right: Normal.     Left: Normal.  Abdominal:     General: Bowel sounds are normal.     Palpations: Abdomen is soft. There is no hepatomegaly or splenomegaly.     Tenderness: There is no abdominal tenderness.  Genitourinary:    Vagina: Normal.     Cervix: Normal.     Uterus: Normal.      Comments: IUD strings present Musculoskeletal:        General: Normal range of motion.     Cervical back: Normal range of motion and neck supple.     Right lower leg: No edema.     Left lower leg: No edema.  Lymphadenopathy:     Head:     Right side of head: No submental, submandibular, tonsillar,  preauricular or posterior auricular adenopathy.     Left side of head: No submental, submandibular, tonsillar, preauricular or posterior auricular adenopathy.     Cervical: No cervical adenopathy.     Upper Body:     Right upper body: No supraclavicular, axillary or pectoral adenopathy.     Left upper body: No supraclavicular, axillary or pectoral adenopathy.  Skin:    General: Skin is warm and dry.     Capillary Refill: Capillary refill takes less than 2 seconds.     Findings: No rash.  Neurological:     Mental Status: She is alert and oriented to person, place, and time.     Gait: Gait is intact.  Psychiatric:        Attention and Perception: Attention normal.        Mood and Affect: Mood normal.        Speech: Speech normal.        Behavior: Behavior normal. Behavior is cooperative.        Thought Content: Thought content normal.        Judgment: Judgment normal.     Results for orders placed or performed during the hospital encounter of 03/13/23  Pregnancy, urine POC  Result Value Ref Range   Preg Test, Ur NEGATIVE NEGATIVE      Assessment & Plan:   Problem List Items Addressed This Visit       Other   Attention deficit hyperactivity disorder    Chronic.  Doing okay with Adderall but prefers Vyvanse.  Adderall increases patient's blood pressure.  Will switch back to vyvanse 50mg  daily.  Refills sent for 3 months.  Follow up in 3 months.  Call sooner if concerns arise.       Anxiety    Chronic.  Controlled.  Continue with current medication regimen.  Aware of risks of long term Benzodiazepine use.  Okay with continuing medication.  Refills sent today.  PDMP checked.  Controlled substance agreement and UDS up to date.  Return to clinic in 3 months for reevaluation.  Call sooner if concerns arise.        Relevant Medications   LORazepam (ATIVAN) 1 MG tablet   Other Visit Diagnoses     Annual physical exam    -  Primary   Health maintenance reviewed during visit  today.  Labs ordered. Mammogram and Colonoscopy up to date.  PAP done in office.   Relevant Orders   CBC with Differential/Platelet   Comprehensive metabolic panel   Lipid panel   TSH   Urinalysis, Routine w reflex microscopic   Cytology - PAP   Screening for  ischemic heart disease       Relevant Orders   Lipid panel        Follow up plan: Return in about 3 months (around 06/16/2023) for Medication Management.   LABORATORY TESTING:  - Pap smear: up to date  IMMUNIZATIONS:   - Tdap: Tetanus vaccination status reviewed: last tetanus booster within 10 years. - Influenza: Postponed to flu season - Pneumovax: Not applicable - Prevnar: Not applicable - COVID: Not applicable - HPV: Not applicable - Shingrix vaccine: Not applicable  SCREENING: -Mammogram: Up to date  - Colonoscopy: Up to date  - Bone Density: Not applicable  -Hearing Test: Not applicable  -Spirometry: Not applicable   PATIENT COUNSELING:   Advised to take 1 mg of folate supplement per day if capable of pregnancy.   Sexuality: Discussed sexually transmitted diseases, partner selection, use of condoms, avoidance of unintended pregnancy  and contraceptive alternatives.   Advised to avoid cigarette smoking.  I discussed with the patient that most people either abstain from alcohol or drink within safe limits (<=14/week and <=4 drinks/occasion for males, <=7/weeks and <= 3 drinks/occasion for females) and that the risk for alcohol disorders and other health effects rises proportionally with the number of drinks per week and how often a drinker exceeds daily limits.  Discussed cessation/primary prevention of drug use and availability of treatment for abuse.   Diet: Encouraged to adjust caloric intake to maintain  or achieve ideal body weight, to reduce intake of dietary saturated fat and total fat, to limit sodium intake by avoiding high sodium foods and not adding table salt, and to maintain adequate dietary  potassium and calcium preferably from fresh fruits, vegetables, and low-fat dairy products.    stressed the importance of regular exercise  Injury prevention: Discussed safety belts, safety helmets, smoke detector, smoking near bedding or upholstery.   Dental health: Discussed importance of regular tooth brushing, flossing, and dental visits.    NEXT PREVENTATIVE PHYSICAL DUE IN 1 YEAR. Return in about 3 months (around 06/16/2023) for Medication Management.

## 2023-03-20 LAB — CYTOLOGY - PAP: Diagnosis: NEGATIVE

## 2023-03-20 NOTE — Progress Notes (Signed)
Hi Kristy.  Your PAP was normal.

## 2023-04-12 ENCOUNTER — Other Ambulatory Visit: Payer: Self-pay | Admitting: Nurse Practitioner

## 2023-04-13 MED ORDER — CETIRIZINE HCL 10 MG PO TABS: 10.0000 mg | ORAL_TABLET | Freq: Every day | ORAL | 3 refills | Status: DC

## 2023-04-13 MED ORDER — AZELASTINE HCL 0.1 % NA SOLN: NASAL | 3 refills | Status: DC

## 2023-04-19 ENCOUNTER — Other Ambulatory Visit: Payer: Self-pay | Admitting: Nurse Practitioner

## 2023-04-20 ENCOUNTER — Other Ambulatory Visit: Payer: Self-pay | Admitting: Nurse Practitioner

## 2023-04-20 NOTE — Telephone Encounter (Signed)
Please call patient and let her know she can't take the Phentermine and Vyvanse which is why I denied the refill.  Both medications can cause heart problems.  She will have to pick which one she would like to continue.

## 2023-04-20 NOTE — Telephone Encounter (Signed)
Requested medications are due for refill today.  yes  Requested medications are on the active medications list.  yes  Last refill. 03/16/2023 #30 0 rf  Future visit scheduled.   yes  Notes to clinic.  Refill not delegated.    Requested Prescriptions  Pending Prescriptions Disp Refills   lisdexamfetamine (VYVANSE) 50 MG capsule [Pharmacy Med Name: LISDEXAMFETAMINE 50 MG CAPSULE] 30 capsule 0    Sig: Take 1 capsule (50 mg total) by mouth daily.     Not Delegated - Psychiatry:  Stimulants/ADHD Failed - 04/20/2023  2:53 PM      Failed - This refill cannot be delegated      Failed - Last BP in normal range    BP Readings from Last 1 Encounters:  03/16/23 (!) 138/93         Passed - Urine Drug Screen completed in last 360 days      Passed - Last Heart Rate in normal range    Pulse Readings from Last 1 Encounters:  03/16/23 77         Passed - Valid encounter within last 6 months    Recent Outpatient Visits           1 month ago Annual physical exam   [place] [facility] Larae Grooms, NP   4 months ago Encounter for screening mammogram for malignant neoplasm of breast   [place] [facility] Larae Grooms, NP   7 months ago Anxiety   [place] [facility] Larae Grooms, NP   10 months ago Anxiety   [place] [facility] Larae Grooms, NP   1 year ago Annual physical exam   [place] [facility] Vigg, Avanti, MD       Future Appointments             In 1 month Mecum, Oswaldo Conroy, PA-C [facility]

## 2023-04-27 ENCOUNTER — Other Ambulatory Visit: Payer: BC Managed Care – PPO

## 2023-04-27 ENCOUNTER — Telehealth: Payer: Self-pay

## 2023-04-27 DIAGNOSIS — Z0000 Encounter for general adult medical examination without abnormal findings: Secondary | ICD-10-CM

## 2023-04-27 DIAGNOSIS — Z136 Encounter for screening for cardiovascular disorders: Secondary | ICD-10-CM | POA: Diagnosis not present

## 2023-04-27 LAB — URINALYSIS, ROUTINE W REFLEX MICROSCOPIC
Bilirubin, UA: NEGATIVE
Glucose, UA: NEGATIVE
Ketones, UA: NEGATIVE
Leukocytes,UA: NEGATIVE
Nitrite, UA: NEGATIVE
Protein,UA: NEGATIVE
RBC, UA: NEGATIVE
Specific Gravity, UA: <1.005 — ABNORMAL LOW (ref 1.005–1.030)
Urobilinogen, Ur: 0.2 mg/dL (ref 0.2–1.0)
pH, UA: 6 (ref 5.0–7.5)

## 2023-04-27 NOTE — Telephone Encounter (Signed)
Patient colonoscopy path results are scanned in on 04/06/2023 and not sent to you. Please review

## 2023-04-28 LAB — CBC WITH DIFFERENTIAL/PLATELET
Basophils Absolute: 0 10*3/uL (ref 0.0–0.2)
Basos: 1 %
EOS (ABSOLUTE): 0.2 10*3/uL (ref 0.0–0.4)
Eos: 3 %
Hematocrit: 43.3 % (ref 34.0–46.6)
Hemoglobin: 14.6 g/dL (ref 11.1–15.9)
Immature Grans (Abs): 0 10*3/uL (ref 0.0–0.1)
Immature Granulocytes: 0 %
Lymphocytes Absolute: 2.8 10*3/uL (ref 0.7–3.1)
Lymphs: 37 %
MCH: 29.6 pg (ref 26.6–33.0)
MCHC: 33.7 g/dL (ref 31.5–35.7)
MCV: 88 fL (ref 79–97)
Monocytes Absolute: 0.9 10*3/uL (ref 0.1–0.9)
Monocytes: 11 %
Neutrophils Absolute: 3.6 10*3/uL (ref 1.4–7.0)
Neutrophils: 48 %
Platelets: 264 10*3/uL (ref 150–450)
RBC: 4.93 x10E6/uL (ref 3.77–5.28)
RDW: 12.9 % (ref 11.7–15.4)
WBC: 7.6 10*3/uL (ref 3.4–10.8)

## 2023-04-28 LAB — COMPREHENSIVE METABOLIC PANEL
ALT: 15 IU/L (ref 0–32)
AST: 15 IU/L (ref 0–40)
Albumin: 4.5 g/dL (ref 3.9–4.9)
Alkaline Phosphatase: 77 IU/L (ref 44–121)
BUN/Creatinine Ratio: 15 (ref 9–23)
BUN: 10 mg/dL (ref 6–24)
Bilirubin Total: 0.6 mg/dL (ref 0.0–1.2)
CO2: 24 mmol/L (ref 20–29)
Calcium: 9.3 mg/dL (ref 8.7–10.2)
Chloride: 102 mmol/L (ref 96–106)
Creatinine, Ser: 0.66 mg/dL (ref 0.57–1.00)
Globulin, Total: 2 g/dL (ref 1.5–4.5)
Glucose: 73 mg/dL (ref 70–99)
Potassium: 3.8 mmol/L (ref 3.5–5.2)
Sodium: 139 mmol/L (ref 134–144)
Total Protein: 6.5 g/dL (ref 6.0–8.5)
eGFR: 107 mL/min/{1.73_m2} (ref >59)

## 2023-04-28 LAB — TSH: TSH: 3.64 u[IU]/mL (ref 0.450–4.500)

## 2023-04-28 LAB — LIPID PANEL
Chol/HDL Ratio: 2.5 ratio (ref 0.0–4.4)
Cholesterol, Total: 160 mg/dL (ref 100–199)
HDL: 65 mg/dL (ref >39)
LDL Chol Calc (NIH): 75 mg/dL (ref 0–99)
Triglycerides: 110 mg/dL (ref 0–149)
VLDL Cholesterol Cal: 20 mg/dL (ref 5–40)

## 2023-04-28 NOTE — Progress Notes (Signed)
Hi Tammy Mays. Your lab work looks good.  No concerns at this time. Continue with your current medication regimen.  Follow up as discussed.  Please let me know if you have any questions.

## 2023-04-29 ENCOUNTER — Encounter: Payer: Self-pay | Admitting: Gastroenterology

## 2023-05-26 ENCOUNTER — Other Ambulatory Visit: Payer: Self-pay | Admitting: Nurse Practitioner

## 2023-05-29 MED ORDER — LISDEXAMFETAMINE DIMESYLATE 50 MG PO CAPS: 50.0000 mg | ORAL_CAPSULE | Freq: Every day | ORAL | 0 refills | Status: DC

## 2023-06-15 ENCOUNTER — Other Ambulatory Visit: Payer: Self-pay | Admitting: Nurse Practitioner

## 2023-06-16 ENCOUNTER — Encounter: Payer: Self-pay | Admitting: Physician Assistant

## 2023-06-16 ENCOUNTER — Ambulatory Visit: Payer: BC Managed Care – PPO | Admitting: Physician Assistant

## 2023-06-16 VITALS — BP 145/87 | HR 92 | Ht 65.0 in | Wt 230.6 lb

## 2023-06-16 DIAGNOSIS — F419 Anxiety disorder, unspecified: Secondary | ICD-10-CM | POA: Diagnosis not present

## 2023-06-16 DIAGNOSIS — F909 Attention-deficit hyperactivity disorder, unspecified type: Secondary | ICD-10-CM

## 2023-06-16 DIAGNOSIS — J309 Allergic rhinitis, unspecified: Secondary | ICD-10-CM

## 2023-06-16 MED ORDER — LORAZEPAM 1 MG PO TABS
ORAL_TABLET | ORAL | 2 refills | Status: DC
Start: 2023-06-16 — End: 2023-09-15

## 2023-06-16 MED ORDER — LISDEXAMFETAMINE DIMESYLATE 50 MG PO CAPS: 50.0000 mg | ORAL_CAPSULE | Freq: Every day | ORAL | 0 refills | Status: DC

## 2023-06-16 MED ORDER — FLUTICASONE PROPIONATE 50 MCG/ACT NA SUSP
NASAL | 5 refills | Status: AC
Start: 2023-06-16 — End: ?

## 2023-06-16 MED ORDER — CETIRIZINE HCL 10 MG PO TABS
10.0000 mg | ORAL_TABLET | Freq: Every day | ORAL | 1 refills | Status: AC
Start: 2023-06-16 — End: ?

## 2023-06-16 MED ORDER — LISDEXAMFETAMINE DIMESYLATE 50 MG PO CAPS
50.0000 mg | ORAL_CAPSULE | Freq: Every day | ORAL | 0 refills | Status: DC
Start: 2023-07-29 — End: 2023-09-18

## 2023-06-16 MED ORDER — AZELASTINE HCL 0.1 % NA SOLN
NASAL | 3 refills | Status: AC
Start: 2023-06-16 — End: ?

## 2023-06-16 NOTE — Assessment & Plan Note (Signed)
Chronic, historic condition Appears controlled with current regimen comprised of Lorazepam 1 mg PO every day PRN  PDMP reviewed, no evidence of worrisome controlled substance use patterns at this time  Refills provided today  Follow up in 3 months or sooner if concerns arise

## 2023-06-16 NOTE — Progress Notes (Signed)
Acute Office Visit   Patient: Tammy Mays   DOB: 1976-02-12   47 y.o. Female  MRN: [id number] Visit Date: 06/29/2023  Today's healthcare provider: Oswaldo Conroy Elvan Ebron, PA-C  Introduced myself to the patient as a Secondary school teacher and provided education on APPs in clinical practice.    Chief Complaint  Patient presents with   ADHD   Anxiety   Medication Refill    Patient is requesting new prescriptions on all her medications, as she has since switched pharmacies at today's visit.    Subjective    HPI HPI     Medication Refill    Additional comments: Patient is requesting new prescriptions on all her medications, as she has since switched pharmacies at today's visit.       Last edited by Malen Gauze, CMA on 29-Jun-2023  1:16 PM.        ADHD FOLLOW UP ADHD status: stable Satisfied with current therapy: yes Medication compliance:  good compliance Controlled substance contract: yes Previous psychiatry evaluation: no Current  medications:  vyvanse (lisdexamfethamine)   Taking meds on weekends/vacations: yes Work/school performance:  good Difficulty sustaining attention/completing tasks: no Distracted by extraneous stimuli: no Does not listen when spoken to: no  Fidgets with hands or feet: no Unable to stay in seat: no Blurts out/interrupts others: no ADHD Medication Side Effects: no    Decreased appetite: no    Headache: no    Sleeping disturbance pattern: no    Irritability: no    Rebound effects (worse than baseline) off medication: no    Anxiousness: no    Dizziness: no    Tics: no   ANXIETY/STRESS Duration:stable Anxious mood: no  Excessive worrying: no Irritability: no  Sweating: no Nausea: no Palpitations:no Hyperventilation: no Panic attacks: no Agoraphobia: no  Obscessions/compulsions: no Depressed mood: no    06-29-23    1:29 PM 03/16/2023    1:18 PM 12/13/2022    9:05 AM 09/12/2022    4:53 PM 06/02/2022    3:41 PM  Depression screen PHQ 2/9   Decreased Interest 0 0 0 0 0  Down, Depressed, Hopeless 0 0 0 0 0  PHQ - 2 Score 0 0 0 0 0  Altered sleeping 2 1 1 2 1   Tired, decreased energy 0 0 0 0 0  Change in appetite 0 0 0 0 0  Feeling bad or failure about yourself  0 0 0 0 0  Trouble concentrating 0 0 1 1 0  Moving slowly or fidgety/restless 0 0 0 0 0  Suicidal thoughts 0 0 0 0 0  PHQ-9 Score 2 1 2 3 1   Difficult doing work/chores Not difficult at all Not difficult at all Not difficult at all Somewhat difficult Not difficult at all   Anhedonia: no Weight changes: no Insomnia: no   Hypersomnia: no Fatigue/loss of energy: no Feelings of worthlessness: no Feelings of guilt: no Impaired concentration/indecisiveness: no Suicidal ideations: no  Crying spells: no Recent Stressors/Life Changes: no   Relationship problems: no   Family stress: no     Financial stress: no    Job stress: no    Recent death/loss: no     2023/06/29    1:30 PM 03/16/2023    1:18 PM 12/13/2022    9:07 AM 09/12/2022    4:53 PM  GAD 7 : Generalized Anxiety Score  Nervous, Anxious, on Edge 0 0 0 1  Control/stop worrying 0 0 0 1  Worry too much - different things 1 0 1 1  Trouble relaxing 1 1 0 0  Restless 0 0 1 0  Easily annoyed or irritable 0 0 0 0  Afraid - awful might happen 0 0 0 0  Total GAD 7 Score 2 1 2 3   Anxiety Difficulty Not difficult at all Not difficult at all Not difficult at all Not difficult at all       Medications: Outpatient Medications Prior to Visit  Medication Sig   amphetamine-dextroamphetamine (ADDERALL) 10 MG tablet Take 1 tablet (10 mg total) by mouth daily.   ibuprofen (ADVIL) 800 MG tablet Take 1 tablet (800 mg total) by mouth every 8 (eight) hours as needed.   [DISCONTINUED] azelastine (ASTELIN) 0.1 % nasal spray INSTILL 1 SPRAY EACH NOSTRIL EVERY DAY AS DIRECTED   [DISCONTINUED] cetirizine (ZYRTEC) 10 MG tablet Take 1 tablet (10 mg total) by mouth daily.   [DISCONTINUED] fluticasone (FLONASE) 50 MCG/ACT  nasal spray SHAKE LIQUID AND USE 2 SPRAYS IN EACH NOSTRIL EVERY DAY   [DISCONTINUED] lisdexamfetamine (VYVANSE) 50 MG capsule Take 1 capsule (50 mg total) by mouth daily.   [DISCONTINUED] LORazepam (ATIVAN) 1 MG tablet TAKE 1 TABLET(1 MG) BY MOUTH DAILY AS NEEDED FOR ANXIETY   No facility-administered medications prior to visit.    Review of Systems  HENT:  Positive for rhinorrhea.   Eyes:  Positive for discharge and itching.  Cardiovascular:  Negative for chest pain, palpitations and leg swelling.  Psychiatric/Behavioral:  Negative for behavioral problems, confusion, decreased concentration, dysphoric mood, hallucinations, sleep disturbance and suicidal ideas. The patient is not nervous/anxious and is not hyperactive.         Objective    BP (!) 145/87   Pulse 92   Ht 5\' 5"  (1.651 m)   Wt 230 lb 9.6 oz (104.6 kg)   SpO2 100%   BMI 38.37 kg/m     Physical Exam Vitals reviewed.  Constitutional:      General: She is awake.     Appearance: Normal appearance. She is well-developed and well-groomed.  HENT:     Head: Normocephalic and atraumatic.  Cardiovascular:     Rate and Rhythm: Normal rate and regular rhythm.     Heart sounds: Normal heart sounds. No murmur heard.    No friction rub. No gallop.  Pulmonary:     Effort: Pulmonary effort is normal.  Musculoskeletal:     Cervical back: Normal range of motion.  Neurological:     General: No focal deficit present.     Mental Status: She is alert and oriented to person, place, and time.     GCS: GCS eye subscore is 4. GCS verbal subscore is 5. GCS motor subscore is 6.     Cranial Nerves: No cranial nerve deficit, dysarthria or facial asymmetry.     Gait: Gait is intact.  Psychiatric:        Attention and Perception: Attention and perception normal.        Mood and Affect: Mood and affect normal.        Speech: Speech normal.        Behavior: Behavior normal. Behavior is cooperative.       No results found for any  visits on 06/16/23.  Assessment & Plan      Return in about 3 months (around 09/16/2023) for anxiety, ADHD follow up .     Problem List Items Addressed This Visit       Respiratory  Allergic sinusitis    Chronic, ongoing, exacerbated at this time due to being out of medications She recently tried to switch medications to [employer] from [facility] but has not been able to pick up  Will send refills of Zyrtec 10 mg PO every day, Azelastine nasal spray and Flonase nasal spray to [place] [facility] per preference Follow up as needed for persistent or progressing symptoms        Relevant Medications   cetirizine (ZYRTEC) 10 MG tablet   azelastine (ASTELIN) 0.1 % nasal spray   fluticasone (FLONASE) 50 MCG/ACT nasal spray     Other   Attention deficit hyperactivity disorder - Primary    Chronic, ongoing, stable She reports symptoms are well controlled with Vyvanse 50 mg pO every day at this time She prefers this over Adderall but had to switch during shortage. Will send Vyvanse refills x 3 month supply today Continue current regimen PDMP reviewed, no evidence of worrisome controlled substance use patterns at this time  Follow up in 3 months or sooner if concerns arise        Relevant Medications   lisdexamfetamine (VYVANSE) 50 MG capsule (Start on 07/29/2023)   lisdexamfetamine (VYVANSE) 50 MG capsule (Start on 06/29/2023)   lisdexamfetamine (VYVANSE) 50 MG capsule (Start on 08/28/2023)   Anxiety    Chronic, historic condition Appears controlled with current regimen comprised of Lorazepam 1 mg PO every day PRN  PDMP reviewed, no evidence of worrisome controlled substance use patterns at this time  Refills provided today  Follow up in 3 months or sooner if concerns arise        Relevant Medications   LORazepam (ATIVAN) 1 MG tablet     Return in about 3 months (around 09/16/2023) for anxiety, ADHD follow up .   I, Anaijah Augsburger E Arieon Corcoran, PA-C, have reviewed all documentation for this  visit. The documentation on 06/16/23 for the exam, diagnosis, procedures, and orders are all accurate and complete.   Jacquelin Hawking, MHS, PA-C [facility]

## 2023-06-16 NOTE — Assessment & Plan Note (Addendum)
Chronic, ongoing, stable She reports symptoms are well controlled with Vyvanse 50 mg pO every day at this time She prefers this over Adderall but had to switch during shortage. Will send Vyvanse refills x 3 month supply today Continue current regimen PDMP reviewed, no evidence of worrisome controlled substance use patterns at this time  Follow up in 3 months or sooner if concerns arise

## 2023-06-16 NOTE — Assessment & Plan Note (Signed)
Chronic, ongoing, exacerbated at this time due to being out of medications She recently tried to switch medications to [place] from [place] but has not been able to pick up  Will send refills of Zyrtec 10 mg PO every day, Azelastine nasal spray and Flonase nasal spray to [address] per preference Follow up as needed for persistent or progressing symptoms

## 2023-08-01 DIAGNOSIS — I10 Essential (primary) hypertension: Secondary | ICD-10-CM | POA: Diagnosis not present

## 2023-08-28 ENCOUNTER — Encounter: Payer: Self-pay | Admitting: Nurse Practitioner

## 2023-08-29 MED ORDER — OSELTAMIVIR PHOSPHATE 75 MG PO CAPS: 75.0000 mg | ORAL_CAPSULE | Freq: Every day | ORAL | 0 refills | Status: DC

## 2023-09-01 DIAGNOSIS — I10 Essential (primary) hypertension: Secondary | ICD-10-CM | POA: Diagnosis not present

## 2023-09-14 ENCOUNTER — Other Ambulatory Visit: Payer: Self-pay | Admitting: Physician Assistant

## 2023-09-14 DIAGNOSIS — F419 Anxiety disorder, unspecified: Secondary | ICD-10-CM

## 2023-09-14 NOTE — Telephone Encounter (Signed)
Requested medication (s) are due for refill today: Due 09/16/23  Requested medication (s) are on the active medication list: yes    Last refill: 06/16/23 #30  2 refills  Future visit scheduled yes  09/18/23  Notes to clinic:Not delegated, please review. Thank you.  Requested Prescriptions  Pending Prescriptions Disp Refills   LORazepam (ATIVAN) 1 MG tablet [Pharmacy Med Name: LORAZEPAM 1 MG TABLET] 30 tablet 0    Sig: TAKE 1 TABLET(1 MG) BY MOUTH DAILY AS NEEDED FOR ANXIETY     Not Delegated - Psychiatry: Anxiolytics/Hypnotics 2 Failed - 09/14/2023  8:55 AM      Failed - This refill cannot be delegated      Failed - Urine Drug Screen completed in last 360 days      Passed - Patient is not pregnant      Passed - Valid encounter within last 6 months    Recent Outpatient Visits           3 months ago Attention deficit hyperactivity disorder (ADHD), unspecified ADHD type   [place] [facility] Mecum, Oswaldo Conroy, PA-C   6 months ago Annual physical exam   [place] [facility] Larae Grooms, NP   9 months ago Encounter for screening mammogram for malignant neoplasm of breast   [place] [facility] Larae Grooms, NP   1 year ago Anxiety   [place] [facility] Larae Grooms, NP   1 year ago Anxiety   [place] [facility] Larae Grooms, NP       Future Appointments             In 4 days Larae Grooms, NP [place] [facility]

## 2023-09-18 ENCOUNTER — Ambulatory Visit: Payer: BC Managed Care – PPO | Admitting: Nurse Practitioner

## 2023-09-18 ENCOUNTER — Encounter: Payer: Self-pay | Admitting: Nurse Practitioner

## 2023-09-18 VITALS — BP 136/80 | HR 94 | Temp 98.6°F | Ht 65.0 in | Wt 230.0 lb

## 2023-09-18 DIAGNOSIS — F419 Anxiety disorder, unspecified: Secondary | ICD-10-CM

## 2023-09-18 DIAGNOSIS — F909 Attention-deficit hyperactivity disorder, unspecified type: Secondary | ICD-10-CM | POA: Diagnosis not present

## 2023-09-18 MED ORDER — LORAZEPAM 1 MG PO TABS
ORAL_TABLET | ORAL | 1 refills | Status: DC
Start: 2023-09-18 — End: 2023-12-18

## 2023-09-18 MED ORDER — LISDEXAMFETAMINE DIMESYLATE 50 MG PO CAPS: 50.0000 mg | ORAL_CAPSULE | Freq: Every day | ORAL | 0 refills | Status: DC

## 2023-09-18 MED ORDER — LISDEXAMFETAMINE DIMESYLATE 50 MG PO CAPS
50.0000 mg | ORAL_CAPSULE | Freq: Every day | ORAL | 0 refills | Status: DC
Start: 2023-09-30 — End: 2023-12-20

## 2023-09-18 NOTE — Progress Notes (Signed)
BP 136/80   Pulse 94   Temp 98.6 F (37 C) (Oral)   Ht 5\' 5"  (1.651 m)   Wt 230 lb (104.3 kg)   SpO2 97%   BMI 38.27 kg/m    Subjective:    Patient ID: Tammy Mays, female    DOB: 03-18-1976, 47 y.o.   MRN: [id number]  HPI: Tammy Mays is a 47 y.o. female  Chief Complaint  Patient presents with   3 month follow up   Anxiety   ADHD     ADHD FOLLOW UP ADHD status: stable- well controlled. Satisfied with current therapy: yes Medication compliance:  excellent compliance Controlled substance contract: yes Previous psychiatry evaluation: no Previous medications: no vyvanse (lisdexamfethamine)   Taking meds on weekends/vacations: yes Work/school performance:  good Difficulty sustaining attention/completing tasks: no Distracted by extraneous stimuli: no Does not listen when spoken to: no  Fidgets with hands or feet: no Unable to stay in seat: no Blurts out/interrupts others: no ADHD Medication Side Effects: no    Decreased appetite: no    Headache: no    Sleeping disturbance pattern: no    Irritability: no    Rebound effects (worse than baseline) off medication: no    Anxiousness: no    Dizziness: no    Tics: no  ANXIETY/STRESS Duration:controlled- denies concerns regarding anxiety today. Well controlled with current medication. Anxious mood: yes  Excessive worrying: yes Irritability: no  Sweating: no Nausea: no Palpitations:no Hyperventilation: no Panic attacks: no Agoraphobia: no  Obscessions/compulsions: no Depressed mood: no    09/18/2023    1:39 PM 06/16/2023    1:29 PM 03/16/2023    1:18 PM 12/13/2022    9:05 AM 09/12/2022    4:53 PM  Depression screen PHQ 2/9  Decreased Interest 0 0 0 0 0  Down, Depressed, Hopeless 0 0 0 0 0  PHQ - 2 Score 0 0 0 0 0  Altered sleeping 0 2 1 1 2   Tired, decreased energy 0 0 0 0 0  Change in appetite 0 0 0 0 0  Feeling bad or failure about yourself  0 0 0 0 0  Trouble concentrating 0 0 0 1 1  Moving slowly or  fidgety/restless 0 0 0 0 0  Suicidal thoughts 0 0 0 0 0  PHQ-9 Score 0 2 1 2 3   Difficult doing work/chores  Not difficult at all Not difficult at all Not difficult at all Somewhat difficult   Anhedonia: no Weight changes: no Insomnia: no hard to stay asleep  Hypersomnia: no Fatigue/loss of energy: yes Feelings of worthlessness: no Feelings of guilt: no Impaired concentration/indecisiveness: no Suicidal ideations: no  Crying spells: no Recent Stressors/Life Changes: no   Relationship problems: no   Family stress: no     Financial stress: no    Job stress: no    Recent death/loss: no    Relevant past medical, surgical, family and social history reviewed and updated as indicated. Interim medical history since our last visit reviewed. Allergies and medications reviewed and updated.  Review of Systems  Eyes:  Negative for visual disturbance.  Respiratory:  Negative for cough, chest tightness and shortness of breath.   Cardiovascular:  Negative for chest pain, palpitations and leg swelling.  Neurological:  Negative for dizziness and headaches.  Psychiatric/Behavioral:  Positive for decreased concentration. The patient is nervous/anxious.     Per HPI unless specifically indicated above     Objective:    BP 136/80  Pulse 94   Temp 98.6 F (37 C) (Oral)   Ht 5\' 5"  (1.651 m)   Wt 230 lb (104.3 kg)   SpO2 97%   BMI 38.27 kg/m   Wt Readings from Last 3 Encounters:  09/18/23 230 lb (104.3 kg)  06/16/23 230 lb 9.6 oz (104.6 kg)  03/16/23 230 lb 4.8 oz (104.5 kg)    Physical Exam Vitals and nursing note reviewed.  Constitutional:      General: She is not in acute distress.    Appearance: Normal appearance. She is not ill-appearing, toxic-appearing or diaphoretic.  HENT:     Head: Normocephalic.     Right Ear: External ear normal.     Left Ear: External ear normal.     Nose: Nose normal.     Mouth/Throat:     Mouth: Mucous membranes are moist.     Pharynx:  Oropharynx is clear.  Eyes:     General:        Right eye: No discharge.        Left eye: No discharge.     Extraocular Movements: Extraocular movements intact.     Conjunctiva/sclera: Conjunctivae normal.     Pupils: Pupils are equal, round, and reactive to light.  Cardiovascular:     Rate and Rhythm: Normal rate and regular rhythm.     Heart sounds: No murmur heard. Pulmonary:     Effort: Pulmonary effort is normal. No respiratory distress.     Breath sounds: Normal breath sounds. No wheezing or rales.  Musculoskeletal:     Cervical back: Normal range of motion and neck supple.  Skin:    General: Skin is warm and dry.     Capillary Refill: Capillary refill takes less than 2 seconds.  Neurological:     General: No focal deficit present.     Mental Status: She is alert and oriented to person, place, and time. Mental status is at baseline.  Psychiatric:        Mood and Affect: Mood normal.        Behavior: Behavior normal.        Thought Content: Thought content normal.        Judgment: Judgment normal.     Results for orders placed or performed in visit on 04/27/23  Urinalysis, Routine w reflex microscopic  Result Value Ref Range   Specific Gravity, UA <1.005 (L) 1.005 - 1.030   pH, UA 6.0 5.0 - 7.5   Color, UA Yellow Yellow   Appearance Ur Clear Clear   Leukocytes,UA Negative Negative   Protein,UA Negative Negative/Trace   Glucose, UA Negative Negative   Ketones, UA Negative Negative   RBC, UA Negative Negative   Bilirubin, UA Negative Negative   Urobilinogen, Ur 0.2 0.2 - 1.0 mg/dL   Nitrite, UA Negative Negative   Microscopic Examination Comment   TSH  Result Value Ref Range   TSH 3.640 0.450 - 4.500 uIU/mL  Lipid panel  Result Value Ref Range   Cholesterol, Total 160 100 - 199 mg/dL   Triglycerides 562 0 - 149 mg/dL   HDL 65 >13 mg/dL   VLDL Cholesterol Cal 20 5 - 40 mg/dL   LDL Chol Calc (NIH) 75 0 - 99 mg/dL   Chol/HDL Ratio 2.5 0.0 - 4.4 ratio   Comprehensive metabolic panel  Result Value Ref Range   Glucose 73 70 - 99 mg/dL   BUN 10 6 - 24 mg/dL   Creatinine, Ser 0.86 0.57 - 1.00  mg/dL   eGFR 161 >09 UE/AVW/0.98   BUN/Creatinine Ratio 15 9 - 23   Sodium 139 134 - 144 mmol/L   Potassium 3.8 3.5 - 5.2 mmol/L   Chloride 102 96 - 106 mmol/L   CO2 24 20 - 29 mmol/L   Calcium 9.3 8.7 - 10.2 mg/dL   Total Protein 6.5 6.0 - 8.5 g/dL   Albumin 4.5 3.9 - 4.9 g/dL   Globulin, Total 2.0 1.5 - 4.5 g/dL   Bilirubin Total 0.6 0.0 - 1.2 mg/dL   Alkaline Phosphatase 77 44 - 121 IU/L   AST 15 0 - 40 IU/L   ALT 15 0 - 32 IU/L  CBC with Differential/Platelet  Result Value Ref Range   WBC 7.6 3.4 - 10.8 x10E3/uL   RBC 4.93 3.77 - 5.28 x10E6/uL   Hemoglobin 14.6 11.1 - 15.9 g/dL   Hematocrit 11.9 14.7 - 46.6 %   MCV 88 79 - 97 fL   MCH 29.6 26.6 - 33.0 pg   MCHC 33.7 31.5 - 35.7 g/dL   RDW 82.9 56.2 - 13.0 %   Platelets 264 150 - 450 x10E3/uL   Neutrophils 48 Not Estab. %   Lymphs 37 Not Estab. %   Monocytes 11 Not Estab. %   Eos 3 Not Estab. %   Basos 1 Not Estab. %   Neutrophils Absolute 3.6 1.4 - 7.0 x10E3/uL   Lymphocytes Absolute 2.8 0.7 - 3.1 x10E3/uL   Monocytes Absolute 0.9 0.1 - 0.9 x10E3/uL   EOS (ABSOLUTE) 0.2 0.0 - 0.4 x10E3/uL   Basophils Absolute 0.0 0.0 - 0.2 x10E3/uL   Immature Granulocytes 0 Not Estab. %   Immature Grans (Abs) 0.0 0.0 - 0.1 x10E3/uL      Assessment & Plan:   Problem List Items Addressed This Visit       Other   Attention deficit hyperactivity disorder - Primary    Chronic, ongoing, stable She reports symptoms are well controlled with Vyvanse 50 mg pO every day at this time Will send Vyvanse refills x 3 month supply today Continue current regimen PDMP reviewed, no evidence of worrisome controlled substance use patterns at this time  UDS and controlled substance agreement up to date Follow up in 3 months or sooner if concerns arise       Relevant Medications   lisdexamfetamine  (VYVANSE) 50 MG capsule (Start on 09/30/2023)   lisdexamfetamine (VYVANSE) 50 MG capsule (Start on 10/30/2023)   lisdexamfetamine (VYVANSE) 50 MG capsule (Start on 11/30/2023)   Other Relevant Orders   [id number] 11+Oxyco+Alc+Crt-Bund   Anxiety    Chronic.  Controlled.  Continue with current medication regimen.  Aware of risks of long term Benzodiazepine use.  Okay with continuing medication.  Refills sent today.  PDMP checked.  Controlled substance agreement and UDS up dated today.  Return to clinic in 3 months for reevaluation.  Call sooner if concerns arise.        Relevant Medications   LORazepam (ATIVAN) 1 MG tablet   Other Relevant Orders   [id number] 11+Oxyco+Alc+Crt-Bund     Follow up plan: Return in about 3 months (around 12/19/2023) for ADHD FU.

## 2023-09-18 NOTE — Assessment & Plan Note (Signed)
Chronic.  Controlled.  Continue with current medication regimen.  Aware of risks of long term Benzodiazepine use.  Okay with continuing medication.  Refills sent today.  PDMP checked.  Controlled substance agreement and UDS up dated today.  Return to clinic in 3 months for reevaluation.  Call sooner if concerns arise.

## 2023-09-18 NOTE — Assessment & Plan Note (Signed)
Chronic, ongoing, stable She reports symptoms are well controlled with Vyvanse 50 mg pO every day at this time Will send Vyvanse refills x 3 month supply today Continue current regimen PDMP reviewed, no evidence of worrisome controlled substance use patterns at this time  UDS and controlled substance agreement up to date Follow up in 3 months or sooner if concerns arise

## 2023-09-21 LAB — DRUG SCREEN 764883 11+OXYCO+ALC+CRT-BUND
BENZODIAZ UR QL: NEGATIVE ng/mL
Barbiturate: NEGATIVE ng/mL
Cannabinoid Quant, Ur: NEGATIVE ng/mL
Ethanol: NEGATIVE %
Meperidine: NEGATIVE ng/mL
Methadone Screen, Urine: NEGATIVE ng/mL
Methamphetamine: NEGATIVE ng/mL
OPIATE SCREEN URINE: NEGATIVE ng/mL
Oxycodone/Oxymorphone, Urine: NEGATIVE ng/mL
Phencyclidine: 93.4 mg/dL (ref 20.0–300.0)
Phencyclidine: NEGATIVE ng/mL
Propoxyphene: NEGATIVE ng/mL
Tramadol: NEGATIVE ng/mL
pH, Urine: 5.2 (ref 4.5–8.9)

## 2023-09-21 LAB — DRUG PROFILE 799016
Amphetamine GC/MS Conf: >3000 ng/mL
Amphetamine: POSITIVE — AB
Amphetamines: POSITIVE — AB
Methamphetamine: NEGATIVE

## 2023-10-01 DIAGNOSIS — I10 Essential (primary) hypertension: Secondary | ICD-10-CM | POA: Diagnosis not present

## 2023-11-01 DIAGNOSIS — I10 Essential (primary) hypertension: Secondary | ICD-10-CM | POA: Diagnosis not present

## 2023-11-08 ENCOUNTER — Encounter: Payer: Self-pay | Admitting: Nurse Practitioner

## 2023-11-08 MED ORDER — OSELTAMIVIR PHOSPHATE 75 MG PO CAPS: 75.0000 mg | ORAL_CAPSULE | Freq: Every day | ORAL | 0 refills | Status: DC

## 2023-12-02 DIAGNOSIS — I10 Essential (primary) hypertension: Secondary | ICD-10-CM | POA: Diagnosis not present

## 2023-12-15 ENCOUNTER — Other Ambulatory Visit: Payer: Self-pay | Admitting: Nurse Practitioner

## 2023-12-15 ENCOUNTER — Encounter: Payer: Self-pay | Admitting: Nurse Practitioner

## 2023-12-15 DIAGNOSIS — F419 Anxiety disorder, unspecified: Secondary | ICD-10-CM

## 2023-12-15 NOTE — Telephone Encounter (Signed)
Requested medication (s) are due for refill today: yes  Requested medication (s) are on the active medication list: yes  Last refill:  09/18/23 #30/1  Future visit scheduled: no  Notes to clinic:  Unable to refill per protocol, cannot delegate.      Requested Prescriptions  Pending Prescriptions Disp Refills   LORazepam (ATIVAN) 1 MG tablet [Pharmacy Med Name: LORAZEPAM 1 MG TABLET] 30 tablet 0    Sig: TAKE 1 TABLET(1 MG) BY MOUTH DAILY AS NEEDED FOR ANXIETY     Not Delegated - Psychiatry: Anxiolytics/Hypnotics 2 Failed - 12/15/2023  2:57 PM      Failed - This refill cannot be delegated      Passed - Urine Drug Screen completed in last 360 days      Passed - Patient is not pregnant      Passed - Valid encounter within last 6 months    Recent Outpatient Visits           2 months ago Attention deficit hyperactivity disorder (ADHD), unspecified ADHD type   [place] [facility] Larae Grooms, NP   6 months ago Attention deficit hyperactivity disorder (ADHD), unspecified ADHD type   [place] [facility] Mecum, Oswaldo Conroy, PA-C   9 months ago Annual physical exam   [place] [facility] Larae Grooms, NP   1 year ago Encounter for screening mammogram for malignant neoplasm of breast   [place] [facility] Larae Grooms, NP   1 year ago Anxiety   [place] [facility] Larae Grooms, NP

## 2023-12-19 ENCOUNTER — Telehealth: Payer: Self-pay

## 2023-12-19 NOTE — Telephone Encounter (Signed)
Ok for [facility] to review.  Appointment for 12/20/2023 flipped to virtual due to inclement weather.

## 2023-12-20 ENCOUNTER — Encounter: Payer: Self-pay | Admitting: Nurse Practitioner

## 2023-12-20 ENCOUNTER — Telehealth: Payer: BC Managed Care – PPO | Admitting: Nurse Practitioner

## 2023-12-20 VITALS — Ht 65.0 in | Wt 230.0 lb

## 2023-12-20 DIAGNOSIS — F909 Attention-deficit hyperactivity disorder, unspecified type: Secondary | ICD-10-CM

## 2023-12-20 DIAGNOSIS — F419 Anxiety disorder, unspecified: Secondary | ICD-10-CM

## 2023-12-20 MED ORDER — LISDEXAMFETAMINE DIMESYLATE 50 MG PO CAPS: 50.0000 mg | ORAL_CAPSULE | Freq: Every day | ORAL | 0 refills | Status: DC

## 2023-12-20 NOTE — Assessment & Plan Note (Signed)
Chronic.  Controlled.  Continue with current medication regimen.  Aware of risks of long term Benzodiazepine use.  Okay with continuing medication.  Refills are up to date.  PDMP checked.  Controlled substance agreement and UDS up to date.  Return to clinic in 3 months for reevaluation.  Call sooner if concerns arise.

## 2023-12-20 NOTE — Progress Notes (Signed)
Ht 5\' 5"  (1.651 m)   Wt 230 lb (104.3 kg)   BMI 38.27 kg/m    Subjective:    Patient ID: Tammy Mays, female    DOB: 09-09-1976, 48 y.o.   MRN: [id number]  HPI: Tammy Mays is a 48 y.o. female  Chief Complaint  Patient presents with   Follow-up    meds     ADHD FOLLOW UP Doing well with Vyvanse.  Prefers the Vyvanse over the Adderall.   ADHD status: stable- well controlled. Satisfied with current therapy: yes Medication compliance:  excellent compliance Controlled substance contract: yes Previous psychiatry evaluation: no Previous medications: no vyvanse (lisdexamfethamine)   Taking meds on weekends/vacations: yes Work/school performance:  good Difficulty sustaining attention/completing tasks: no Distracted by extraneous stimuli: no Does not listen when spoken to: no  Fidgets with hands or feet: no Unable to stay in seat: no Blurts out/interrupts others: no ADHD Medication Side Effects: no    Decreased appetite: no    Headache: no    Sleeping disturbance pattern: no    Irritability: no    Rebound effects (worse than baseline) off medication: no    Anxiousness: no    Dizziness: no    Tics: no  ANXIETY/STRESS Doing well with Lorazepam.  No concerns regarding medication today. Feels like when she retires she may be able to switch to an alternative. Duration:controlled- denies concerns regarding anxiety today. Well controlled with current medication. Anxious mood: yes  Excessive worrying: yes Irritability: no  Sweating: no Nausea: no Palpitations:no Hyperventilation: no Panic attacks: no Agoraphobia: no  Obscessions/compulsions: no Depressed mood: no    12/20/2023    1:29 PM 09/18/2023    1:39 PM 06/16/2023    1:29 PM 03/16/2023    1:18 PM 12/13/2022    9:05 AM  Depression screen PHQ 2/9  Decreased Interest 0 0 0 0 0  Down, Depressed, Hopeless 0 0 0 0 0  PHQ - 2 Score 0 0 0 0 0  Altered sleeping 1 0 2 1 1   Tired, decreased energy 0 0 0 0 0  Change in  appetite 0 0 0 0 0  Feeling bad or failure about yourself  0 0 0 0 0  Trouble concentrating 0 0 0 0 1  Moving slowly or fidgety/restless 0 0 0 0 0  Suicidal thoughts 0 0 0 0 0  PHQ-9 Score 1 0 2 1 2   Difficult doing work/chores Not difficult at all  Not difficult at all Not difficult at all Not difficult at all   Anhedonia: no Weight changes: no Insomnia: no hard to stay asleep  Hypersomnia: no Fatigue/loss of energy: yes Feelings of worthlessness: no Feelings of guilt: no Impaired concentration/indecisiveness: no Suicidal ideations: no  Crying spells: no Recent Stressors/Life Changes: no   Relationship problems: no   Family stress: no     Financial stress: no    Job stress: no    Recent death/loss: no    Relevant past medical, surgical, family and social history reviewed and updated as indicated. Interim medical history since our last visit reviewed. Allergies and medications reviewed and updated.  Review of Systems  Eyes:  Negative for visual disturbance.  Respiratory:  Negative for cough, chest tightness and shortness of breath.   Cardiovascular:  Negative for chest pain, palpitations and leg swelling.  Neurological:  Negative for dizziness and headaches.  Psychiatric/Behavioral:  Positive for decreased concentration. The patient is nervous/anxious.     Per HPI unless  specifically indicated above     Objective:    Ht 5\' 5"  (1.651 m)   Wt 230 lb (104.3 kg)   BMI 38.27 kg/m   Wt Readings from Last 3 Encounters:  12/20/23 230 lb (104.3 kg)  09/18/23 230 lb (104.3 kg)  06/16/23 230 lb 9.6 oz (104.6 kg)    Physical Exam Vitals and nursing note reviewed.  Constitutional:      General: She is not in acute distress.    Appearance: She is not ill-appearing.  HENT:     Head: Normocephalic.     Right Ear: Hearing normal.     Left Ear: Hearing normal.     Nose: Nose normal.  Pulmonary:     Effort: Pulmonary effort is normal. No respiratory distress.   Neurological:     Mental Status: She is alert.  Psychiatric:        Mood and Affect: Mood normal.        Behavior: Behavior normal.        Thought Content: Thought content normal.        Judgment: Judgment normal.     Results for orders placed or performed in visit on 09/18/23  [id number] 11+Oxyco+Alc+Crt-Bund   Collection Time: 09/18/23  2:01 PM  Result Value Ref Range   Ethanol Negative Cutoff=0.020 %   Amphetamines, Urine See Final Results Cutoff=1000 ng/mL   Barbiturate Negative Cutoff=200 ng/mL   BENZODIAZ UR QL Negative Cutoff=200 ng/mL   Cannabinoid Quant, Ur Negative Cutoff=50 ng/mL   Cocaine (Metabolite) Negative Cutoff=300 ng/mL   OPIATE SCREEN URINE Negative Cutoff=300 ng/mL   Oxycodone/Oxymorphone, Urine Negative Cutoff=300 ng/mL   Phencyclidine Negative Cutoff=25 ng/mL   Methadone Screen, Urine Negative Cutoff=300 ng/mL   Propoxyphene Negative Cutoff=300 ng/mL   Meperidine Negative Cutoff=200 ng/mL   Tramadol Negative Cutoff=200 ng/mL   Creatinine 93.4 20.0 - 300.0 mg/dL   pH, Urine 5.2 4.5 - 8.9  Drug Profile [id number]   Collection Time: 09/18/23  2:01 PM  Result Value Ref Range   Amphetamines Positive (A) Cutoff=1000   Amphetamine Positive (A)    Amphetamine GC/MS Conf >3000 Cutoff=500 ng/mL   Methamphetamine Negative Cutoff=500      Assessment & Plan:   Problem List Items Addressed This Visit       Other   Attention deficit hyperactivity disorder   Chronic, ongoing, stable She reports symptoms are well controlled with Vyvanse 50 mg pO every day at this time Will send Vyvanse refills x 3 month supply today PDMP reviewed during visit UDS and controlled substance agreement up to date Follow up in 3 months or sooner if concerns arise       Relevant Medications   lisdexamfetamine (VYVANSE) 50 MG capsule (Start on 01/01/2024)   lisdexamfetamine (VYVANSE) 50 MG capsule (Start on 02/01/2024)   lisdexamfetamine (VYVANSE) 50 MG capsule (Start on 03/01/2024)    Anxiety - Primary   Chronic.  Controlled.  Continue with current medication regimen.  Aware of risks of long term Benzodiazepine use.  Okay with continuing medication.  Refills are up to date.  PDMP checked.  Controlled substance agreement and UDS up to date.  Return to clinic in 3 months for reevaluation.  Call sooner if concerns arise.          Follow up plan: Return in about 3 months (around 03/18/2024) for Medication Management.   This visit was completed via MyChart due to the restrictions of the COVID-19 pandemic. All issues as above were discussed and addressed. Physical  exam was done as above through visual confirmation on MyChart. If it was felt that the patient should be evaluated in the office, they were directed there. The patient verbally consented to this visit. Location of the patient: Home Location of the provider: Office Those involved with this call:  Provider: Larae Grooms, NP CMA: Irene Pap, CMA Front Desk/Registration: Servando Snare This encounter was conducted via video.  I spent 20 dedicated to the care of this patient on the date of this encounter to include previsit review of symptoms, plan of care and follow up, face to face time with the patient, and post visit ordering of testing.

## 2023-12-20 NOTE — Assessment & Plan Note (Addendum)
Chronic, ongoing, stable She reports symptoms are well controlled with Vyvanse 50 mg pO every day at this time Will send Vyvanse refills x 3 month supply today PDMP reviewed during visit UDS and controlled substance agreement up to date Follow up in 3 months or sooner if concerns arise

## 2024-01-01 DIAGNOSIS — I10 Essential (primary) hypertension: Secondary | ICD-10-CM | POA: Diagnosis not present

## 2024-01-17 ENCOUNTER — Other Ambulatory Visit: Payer: Self-pay | Admitting: Nurse Practitioner

## 2024-01-17 DIAGNOSIS — F419 Anxiety disorder, unspecified: Secondary | ICD-10-CM

## 2024-01-17 DIAGNOSIS — F909 Attention-deficit hyperactivity disorder, unspecified type: Secondary | ICD-10-CM

## 2024-01-17 MED ORDER — LORAZEPAM 1 MG PO TABS: ORAL_TABLET | ORAL | 1 refills | Status: DC

## 2024-01-17 MED ORDER — IBUPROFEN 800 MG PO TABS: 800.0000 mg | ORAL_TABLET | Freq: Three times a day (TID) | ORAL | 0 refills | Status: AC | PRN

## 2024-01-31 DIAGNOSIS — I10 Essential (primary) hypertension: Secondary | ICD-10-CM | POA: Diagnosis not present

## 2024-02-05 ENCOUNTER — Other Ambulatory Visit: Payer: Self-pay | Admitting: Nurse Practitioner

## 2024-02-05 DIAGNOSIS — Z1231 Encounter for screening mammogram for malignant neoplasm of breast: Secondary | ICD-10-CM

## 2024-02-14 ENCOUNTER — Ambulatory Visit
Admission: RE | Admit: 2024-02-14 | Discharge: 2024-02-14 | Disposition: A | Discharge status: Home / Self Care | Source: Ambulatory Visit | Attending: Nurse Practitioner | Admitting: Nurse Practitioner

## 2024-02-14 DIAGNOSIS — Z1231 Encounter for screening mammogram for malignant neoplasm of breast: Secondary | ICD-10-CM | POA: Diagnosis not present

## 2024-03-01 DIAGNOSIS — I10 Essential (primary) hypertension: Secondary | ICD-10-CM | POA: Diagnosis not present

## 2024-03-16 ENCOUNTER — Other Ambulatory Visit: Payer: Self-pay | Admitting: Nurse Practitioner

## 2024-03-16 DIAGNOSIS — F419 Anxiety disorder, unspecified: Secondary | ICD-10-CM

## 2024-03-19 ENCOUNTER — Encounter: Payer: Self-pay | Admitting: Nurse Practitioner

## 2024-03-19 NOTE — Telephone Encounter (Signed)
 Requested medication (s) are due for refill today: yes  Requested medication (s) are on the active medication list: yes  Last refill:  01/17/24 #30/1  Future visit scheduled: yes  Notes to clinic:  Unable to refill per protocol, cannot delegate.      Requested Prescriptions  Pending Prescriptions Disp Refills   LORazepam  (ATIVAN ) 1 MG tablet [Pharmacy Med Name: LORAZEPAM  1 MG TABLET] 30 tablet 0    Sig: TAKE 1 TABLET(1 MG) BY MOUTH DAILY AS NEEDED FOR ANXIETY     Not Delegated - Psychiatry: Anxiolytics/Hypnotics 2 Failed - 03/19/2024  8:36 AM      Failed - This refill cannot be delegated      Passed - Urine Drug Screen completed in last 360 days      Passed - Patient is not pregnant      Passed - Valid encounter within last 6 months    Recent Outpatient Visits           3 months ago Anxiety   [place] [facility] Aileen Alexanders, NP

## 2024-03-19 NOTE — Telephone Encounter (Signed)
 Duplicate request.  Requested Prescriptions  Pending Prescriptions Disp Refills   LORazepam  (ATIVAN ) 1 MG tablet [Pharmacy Med Name: LORAZEPAM  1 MG TABLET] 30 tablet 0    Sig: TAKE 1 TABLET(1 MG) BY MOUTH DAILY AS NEEDED FOR ANXIETY     Not Delegated - Psychiatry: Anxiolytics/Hypnotics 2 Failed - 03/19/2024 12:35 PM      Failed - This refill cannot be delegated      Passed - Urine Drug Screen completed in last 360 days      Passed - Patient is not pregnant      Passed - Valid encounter within last 6 months    Recent Outpatient Visits           3 months ago Anxiety   [place] [facility] Aileen Alexanders, NP

## 2024-03-31 DIAGNOSIS — I10 Essential (primary) hypertension: Secondary | ICD-10-CM | POA: Diagnosis not present

## 2024-04-08 ENCOUNTER — Encounter: Payer: Self-pay | Admitting: Nurse Practitioner

## 2024-04-08 ENCOUNTER — Other Ambulatory Visit: Payer: Self-pay

## 2024-04-08 ENCOUNTER — Ambulatory Visit: Admitting: Nurse Practitioner

## 2024-04-08 VITALS — BP 114/78 | HR 85 | Temp 97.7°F | Ht 64.0 in | Wt 236.4 lb

## 2024-04-08 DIAGNOSIS — Z136 Encounter for screening for cardiovascular disorders: Secondary | ICD-10-CM | POA: Diagnosis not present

## 2024-04-08 DIAGNOSIS — F909 Attention-deficit hyperactivity disorder, unspecified type: Secondary | ICD-10-CM | POA: Diagnosis not present

## 2024-04-08 DIAGNOSIS — F419 Anxiety disorder, unspecified: Secondary | ICD-10-CM | POA: Diagnosis not present

## 2024-04-08 DIAGNOSIS — Z6841 Body Mass Index (BMI) 40.0 and over, adult: Secondary | ICD-10-CM

## 2024-04-08 DIAGNOSIS — Z0000 Encounter for general adult medical examination without abnormal findings: Secondary | ICD-10-CM

## 2024-04-08 MED ORDER — LORAZEPAM 1 MG PO TABS
1.0000 mg | ORAL_TABLET | Freq: Every day | ORAL | 0 refills | Status: DC | PRN
  Filled 2024-04-08: qty 30, 30d supply, fill #0

## 2024-04-08 MED ORDER — WEGOVY 0.5 MG/0.5ML ~~LOC~~ SOAJ
0.5000 mg | SUBCUTANEOUS | 2 refills | Status: DC
  Filled 2024-04-08: qty 2, fill #0
  Filled 2024-05-01: qty 2, 28d supply, fill #0
  Filled 2024-05-28: qty 2, 28d supply, fill #1

## 2024-04-08 MED ORDER — LISDEXAMFETAMINE DIMESYLATE 50 MG PO CAPS
50.0000 mg | ORAL_CAPSULE | Freq: Every day | ORAL | 0 refills | Status: DC
  Filled 2024-04-08: qty 30, 30d supply, fill #0

## 2024-04-08 MED ORDER — LISDEXAMFETAMINE DIMESYLATE 50 MG PO CAPS: 50.0000 mg | ORAL_CAPSULE | Freq: Every day | ORAL | 0 refills | Status: DC

## 2024-04-08 MED ORDER — WEGOVY 0.25 MG/0.5ML ~~LOC~~ SOAJ
0.2500 mg | SUBCUTANEOUS | 0 refills | Status: DC
  Filled 2024-04-08 (×2): qty 2, 28d supply, fill #0

## 2024-04-08 NOTE — Assessment & Plan Note (Signed)
 Chronic, ongoing, stable She reports symptoms are well controlled with Vyvanse  50 mg pO every day at this time Will send Vyvanse  refills x 3 month supply today PDMP reviewed during visit UDS and controlled substance agreement need to be updated at next visit Follow up in 3 months or sooner if concerns arise

## 2024-04-08 NOTE — Progress Notes (Signed)
 BP 114/78 Comment: Home blood pressure reading  Pulse 85   Temp 97.7 F (36.5 C) (Oral)   Ht 5\' 4"  (1.626 m)   Wt 236 lb 6.4 oz (107.2 kg)   LMP 03/20/2024 (Approximate)   SpO2 98%   BMI 40.58 kg/m    Subjective:    Patient ID: Tammy Mays, female    DOB: [birth year], 48 y.o.   MRN: [id number]  HPI: Tammy Mays is a 48 y.o. female presenting on 04/08/2024 for comprehensive medical examination. Current medical complaints include:none  She currently lives with: Menopausal Symptoms: no  Patient would like to work on weight loss.  Is interested   Patient is going to retire in August.  When she has her blood pressure checked at work <120/70-80  ADHD FOLLOW UP She would like to go back to the vyvanse  when it is available.  ADHD status: stable- well controlled. Satisfied with current therapy: yes Medication compliance:  excellent compliance Controlled substance contract: yes Previous psychiatry evaluation: no Previous medications: no vyvanse  (lisdexamfethamine)   Taking meds on weekends/vacations: yes Work/school performance:  good Difficulty sustaining attention/completing tasks: no Distracted by extraneous stimuli: no Does not listen when spoken to: no  Fidgets with hands or feet: no Unable to stay in seat: no Blurts out/interrupts others: no ADHD Medication Side Effects: no    Decreased appetite: no    Headache: no    Sleeping disturbance pattern: no    Irritability: no    Rebound effects (worse than baseline) off medication: no    Anxiousness: no    Dizziness: no    Tics: no   ANXIETY/STRESS Duration:controlled- denies concerns regarding anxiety today. Well controlled with current medication. Anxious mood: yes  Excessive worrying: yes Irritability: no  Sweating: no Nausea: no Palpitations:no Hyperventilation: no Panic attacks: no Agoraphobia: no  Obscessions/compulsions: no Depressed mood: no     12/13/2022    9:05 AM 09/12/2022    4:53 PM 06/02/2022     3:41 PM 03/09/2022    1:27 PM 11/11/2021   10:41 AM  Depression screen PHQ 2/9  Decreased Interest 0 0 0 0 0  Down, Depressed, Hopeless 0 0 0 0 0  PHQ - 2 Score 0 0 0 0 0  Altered sleeping 1 2 1 2 3   Tired, decreased energy 0 0 0 0 1  Change in appetite 0 0 0 0 1  Feeling bad or failure about yourself  0 0 0 0 0  Trouble concentrating 1 1 0 1 0  Moving slowly or fidgety/restless 0 0 0 0 0  Suicidal thoughts 0 0 0 0 0  PHQ-9 Score 2 3 1 3 5   Difficult doing work/chores Not difficult at all Somewhat difficult Not difficult at all Not difficult at all Somewhat difficult    Anhedonia: no Weight changes: no Insomnia: no hard to stay asleep  Hypersomnia: no Fatigue/loss of energy: yes Feelings of worthlessness: no Feelings of guilt: no Impaired concentration/indecisiveness: no Suicidal ideations: no  Crying spells: no Recent Stressors/Life Changes: no   Relationship problems: no   Family stress: no     Financial stress: no    Job stress: no    Recent death/loss: no    Depression Screen done today and results listed below:     04/08/2024   11:05 AM 12/20/2023    1:29 PM 09/18/2023    1:39 PM 06/16/2023    1:29 PM 03/16/2023    1:18 PM  Depression screen PHQ  2/9  Decreased Interest 0 0 0 0 0  Down, Depressed, Hopeless 0 0 0 0 0  PHQ - 2 Score 0 0 0 0 0  Altered sleeping 2 1 0 2 1  Tired, decreased energy 0 0 0 0 0  Change in appetite 0 0 0 0 0  Feeling bad or failure about yourself  0 0 0 0 0  Trouble concentrating 0 0 0 0 0  Moving slowly or fidgety/restless 0 0 0 0 0  Suicidal thoughts 0 0 0 0 0  PHQ-9 Score 2 1 0 2 1  Difficult doing work/chores Not difficult at all Not difficult at all  Not difficult at all Not difficult at all    The patient does not have a history of falls. I did complete a risk assessment for falls. A plan of care for falls was documented.   Past Medical History:  Past Medical History:  Diagnosis Date   Abnormal Pap smear of cervix     Allergies    Allergy 2019   Anxiety    Attention deficit hyperactivity disorder    Depression    Insomnia    Wears hearing aid    Bilateral    Surgical History:  Past Surgical History:  Procedure Laterality Date   BREAST BIOPSY Right 08/29/2019   neg, calcs   CERVICAL BIOPSY  W/ LOOP ELECTRODE EXCISION     COLONOSCOPY WITH PROPOFOL  N/A 03/13/2023   Procedure: COLONOSCOPY WITH BIOPSY;  Surgeon: Marnee Sink, MD;  Location: [facility];  Service: Endoscopy;  Laterality: N/A;   LEEP     POLYPECTOMY N/A 03/13/2023   Procedure: POLYPECTOMY;  Surgeon: Marnee Sink, MD;  Location: [facility];  Service: Endoscopy;  Laterality: N/A;   TONSILLECTOMY      Medications:  Current Outpatient Medications on File Prior to Visit  Medication Sig   azelastine  (ASTELIN ) 0.1 % nasal spray INSTILL 1 SPRAY EACH NOSTRIL EVERY DAY AS DIRECTED   cetirizine  (ZYRTEC ) 10 MG tablet Take 1 tablet (10 mg total) by mouth daily.   fluticasone  (FLONASE ) 50 MCG/ACT nasal spray SHAKE LIQUID AND USE 2 SPRAYS IN EACH NOSTRIL EVERY DAY   ibuprofen  (ADVIL ) 800 MG tablet Take 1 tablet (800 mg total) by mouth every 8 (eight) hours as needed.   No current facility-administered medications on file prior to visit.    Allergies:  No Known Allergies  Social History:  Social History   Socioeconomic History   Marital status: Married    Spouse name: Not on file   Number of children: Not on file   Years of education: Not on file   Highest education level: Master's degree (e.g., MA, MS, MEng, MEd, MSW, MBA)  Occupational History   Not on file  Tobacco Use   Smoking status: Former    Current packs/day: 0.00    Types: Cigarettes    Quit date: 06/29/1999    Years since quitting: 24.7   Smokeless tobacco: Never  Vaping Use   Vaping status: Never Used  Substance and Sexual Activity   Alcohol use: Yes    Comment: occasional   Drug use: No   Sexual activity: Yes    Birth control/protection: I.U.D.   Other Topics Concern   Not on file  Social History Narrative   Not on file   Social Drivers of Health   Financial Resource Strain: Low Risk  (04/07/2024)   Overall Financial Resource Strain (CARDIA)    Difficulty of Paying Living Expenses:  Not very hard  Food Insecurity: No Food Insecurity (04/07/2024)   Hunger Vital Sign    Worried About Running Out of Food in the Last Year: Never true    Ran Out of Food in the Last Year: Never true  Transportation Needs: No Transportation Needs (04/07/2024)   PRAPARE - Administrator, Civil Service (Medical): No    Lack of Transportation (Non-Medical): No  Physical Activity: Insufficiently Active (04/07/2024)   Exercise Vital Sign    Days of Exercise per Week: 3 days    Minutes of Exercise per Session: 30 min  Stress: No Stress Concern Present (04/07/2024)   [employer] of Occupational Health - Occupational Stress Questionnaire    Feeling of Stress : Only a little  Social Connections: Socially Integrated (04/07/2024)   Social Connection and Isolation Panel [NHANES]    Frequency of Communication with Friends and Family: Twice a week    Frequency of Social Gatherings with Friends and Family: Once a week    Attends Religious Services: 1 to 4 times per year    Active Member of [employer] or Organizations: No    Attends Engineer, structural: 1 to 4 times per year    Marital Status: Married  Catering manager Violence: Not on file   Social History   Tobacco Use  Smoking Status Former   Current packs/day: 0.00   Types: Cigarettes   Quit date: 06/29/1999   Years since quitting: 24.7  Smokeless Tobacco Never   Social History   Substance and Sexual Activity  Alcohol Use Yes   Comment: occasional    Family History:  Family History  Problem Relation Age of Onset   Hepatitis C Mother    Obesity Mother    Breast cancer Neg Hx    Cancer Neg Hx    Diabetes Neg Hx    Heart disease Neg Hx    Hypertension Neg Hx    Stroke Neg Hx     COPD Neg Hx     Past medical history, surgical history, medications, allergies, family history and social history reviewed with patient today and changes made to appropriate areas of the chart.   Review of Systems  Psychiatric/Behavioral:  Negative for depression and suicidal ideas. The patient is nervous/anxious.    All other ROS negative except what is listed above and in the HPI.      Objective:    BP 114/78 Comment: Home blood pressure reading  Pulse 85   Temp 97.7 F (36.5 C) (Oral)   Ht 5\' 4"  (1.626 m)   Wt 236 lb 6.4 oz (107.2 kg)   LMP 03/20/2024 (Approximate)   SpO2 98%   BMI 40.58 kg/m   Wt Readings from Last 3 Encounters:  04/08/24 236 lb 6.4 oz (107.2 kg)  12/20/23 230 lb (104.3 kg)  09/18/23 230 lb (104.3 kg)    Physical Exam Vitals and nursing note reviewed.  Constitutional:      General: She is awake. She is not in acute distress.    Appearance: Normal appearance. She is well-developed. She is not ill-appearing.  HENT:     Head: Normocephalic and atraumatic.     Right Ear: Hearing, tympanic membrane, ear canal and external ear normal. No drainage.     Left Ear: Hearing, tympanic membrane, ear canal and external ear normal. No drainage.     Nose: Nose normal.     Right Sinus: No maxillary sinus tenderness or frontal sinus tenderness.  Left Sinus: No maxillary sinus tenderness or frontal sinus tenderness.     Mouth/Throat:     Mouth: Mucous membranes are moist.     Pharynx: Oropharynx is clear. Uvula midline. No pharyngeal swelling, oropharyngeal exudate or posterior oropharyngeal erythema.  Eyes:     General: Lids are normal.        Right eye: No discharge.        Left eye: No discharge.     Extraocular Movements: Extraocular movements intact.     Conjunctiva/sclera: Conjunctivae normal.     Pupils: Pupils are equal, round, and reactive to light.     Visual Fields: Right eye visual fields normal and left eye visual fields normal.  Neck:      Thyroid: No thyromegaly.     Vascular: No carotid bruit.     Trachea: Trachea normal.  Cardiovascular:     Rate and Rhythm: Normal rate and regular rhythm.     Heart sounds: Normal heart sounds. No murmur heard.    No gallop.  Pulmonary:     Effort: Pulmonary effort is normal. No accessory muscle usage or respiratory distress.     Breath sounds: Normal breath sounds.  Chest:  Breasts:    Right: Normal.     Left: Normal.  Abdominal:     General: Bowel sounds are normal.     Palpations: Abdomen is soft. There is no hepatomegaly or splenomegaly.     Tenderness: There is no abdominal tenderness.  Musculoskeletal:        General: Normal range of motion.     Cervical back: Normal range of motion and neck supple.     Right lower leg: No edema.     Left lower leg: No edema.  Lymphadenopathy:     Head:     Right side of head: No submental, submandibular, tonsillar, preauricular or posterior auricular adenopathy.     Left side of head: No submental, submandibular, tonsillar, preauricular or posterior auricular adenopathy.     Cervical: No cervical adenopathy.     Upper Body:     Right upper body: No supraclavicular, axillary or pectoral adenopathy.     Left upper body: No supraclavicular, axillary or pectoral adenopathy.  Skin:    General: Skin is warm and dry.     Capillary Refill: Capillary refill takes less than 2 seconds.     Findings: No rash.  Neurological:     Mental Status: She is alert and oriented to person, place, and time.     Gait: Gait is intact.  Psychiatric:        Attention and Perception: Attention normal.        Mood and Affect: Mood normal.        Speech: Speech normal.        Behavior: Behavior normal. Behavior is cooperative.        Thought Content: Thought content normal.        Judgment: Judgment normal.     Results for orders placed or performed in visit on 09/18/23  [id number] 11+Oxyco+Alc+Crt-Bund   Collection Time: 09/18/23  2:01 PM  Result Value Ref  Range   Ethanol Negative Cutoff=0.020 %   Amphetamines, Urine See Final Results Cutoff=1000 ng/mL   Barbiturate Negative Cutoff=200 ng/mL   BENZODIAZ UR QL Negative Cutoff=200 ng/mL   Cannabinoid Quant, Ur Negative Cutoff=50 ng/mL   Cocaine (Metabolite) Negative Cutoff=300 ng/mL   OPIATE SCREEN URINE Negative Cutoff=300 ng/mL   Oxycodone/Oxymorphone, Urine Negative Cutoff=300 ng/mL  Phencyclidine Negative Cutoff=25 ng/mL   Methadone Screen, Urine Negative Cutoff=300 ng/mL   Propoxyphene Negative Cutoff=300 ng/mL   Meperidine Negative Cutoff=200 ng/mL   Tramadol Negative Cutoff=200 ng/mL   Creatinine 93.4 20.0 - 300.0 mg/dL   pH, Urine 5.2 4.5 - 8.9  Drug Profile [id number]   Collection Time: 09/18/23  2:01 PM  Result Value Ref Range   Amphetamines Positive (A) Cutoff=1000   Amphetamine  Positive (A)    Amphetamine  GC/MS Conf >3000 Cutoff=500 ng/mL   Methamphetamine Negative Cutoff=500      Assessment & Plan:   Problem List Items Addressed This Visit       Other   Attention deficit hyperactivity disorder   Chronic, ongoing, stable She reports symptoms are well controlled with Vyvanse  50 mg pO every day at this time Will send Vyvanse  refills x 3 month supply today PDMP reviewed during visit UDS and controlled substance agreement need to be updated at next visit Follow up in 3 months or sooner if concerns arise       Relevant Medications   lisdexamfetamine (VYVANSE ) 50 MG capsule   lisdexamfetamine (VYVANSE ) 50 MG capsule (Start on 05/08/2024)   lisdexamfetamine (VYVANSE ) 50 MG capsule (Start on 06/07/2024)   Anxiety   Chronic.  Controlled.  Continue with current medication regimen.  Aware of risks of long term Benzodiazepine use.  Okay with continuing medication.  Refills are up to date.  PDMP checked.  Controlled substance agreement and UDS up to date.  Return to clinic in 3 months for reevaluation.  Call sooner if concerns arise.       Relevant Medications   LORazepam   (ATIVAN ) 1 MG tablet   BMI 40.0-44.9, adult (HCC)   Chronic.  Not well controlled.  Patient has tried diet and exercise for weight loss without success.  Will start [facility] 0.25mg  weekly.  Will increase to [facility] 0.5mg  weekly after the first 4 weeks.  Discussed how to inject medication.  Discussed side effects and benefits of medication.  Follow up in 3 months.  Call sooner if concerns arise.         Relevant Medications   Semaglutide-Weight Management (WEGOVY) 0.25 MG/0.5ML SOAJ   Semaglutide-Weight Management (WEGOVY) 0.5 MG/0.5ML SOAJ   lisdexamfetamine (VYVANSE ) 50 MG capsule   lisdexamfetamine (VYVANSE ) 50 MG capsule (Start on 05/08/2024)   lisdexamfetamine (VYVANSE ) 50 MG capsule (Start on 06/07/2024)   Other Visit Diagnoses       Annual physical exam    -  Primary   Health maintenance reviewed during visit today. Labs ordered.  Vaccine reviewed.  PAP up to date.- does like to get them annually. Will get at next visit.   Relevant Orders   CBC with Differential/Platelet   Comprehensive metabolic panel with GFR   Lipid panel   TSH     Screening for ischemic heart disease       Relevant Orders   Lipid panel         Follow up plan: Return in about 3 months (around 07/09/2024) for Weight Managment, Medication Management.   LABORATORY TESTING:  - Pap smear: up to date  IMMUNIZATIONS:   - Tdap: Tetanus vaccination status reviewed: last tetanus booster within 10 years. - Influenza: Postponed to flu season - Pneumovax: Not applicable - Prevnar: Not applicable - COVID: Not applicable - HPV: Not applicable - Shingrix vaccine: Not applicable  SCREENING: -Mammogram: Up to date  - Colonoscopy: Up to date  - Bone Density: Not applicable  -Hearing Test: Not applicable  -  Spirometry: Not applicable   PATIENT COUNSELING:   Advised to take 1 mg of folate supplement per day if capable of pregnancy.   Sexuality: Discussed sexually transmitted diseases, partner selection, use of  condoms, avoidance of unintended pregnancy  and contraceptive alternatives.   Advised to avoid cigarette smoking.  I discussed with the patient that most people either abstain from alcohol or drink within safe limits (<=14/week and <=4 drinks/occasion for males, <=7/weeks and <= 3 drinks/occasion for females) and that the risk for alcohol disorders and other health effects rises proportionally with the number of drinks per week and how often a drinker exceeds daily limits.  Discussed cessation/primary prevention of drug use and availability of treatment for abuse.   Diet: Encouraged to adjust caloric intake to maintain  or achieve ideal body weight, to reduce intake of dietary saturated fat and total fat, to limit sodium intake by avoiding high sodium foods and not adding table salt, and to maintain adequate dietary potassium and calcium preferably from fresh fruits, vegetables, and low-fat dairy products.    stressed the importance of regular exercise  Injury prevention: Discussed safety belts, safety helmets, smoke detector, smoking near bedding or upholstery.   Dental health: Discussed importance of regular tooth brushing, flossing, and dental visits.    NEXT PREVENTATIVE PHYSICAL DUE IN 1 YEAR. Return in about 3 months (around 07/09/2024) for Weight Managment, Medication Management.

## 2024-04-08 NOTE — Assessment & Plan Note (Signed)
 Chronic.  Controlled.  Continue with current medication regimen.  Aware of risks of long term Benzodiazepine use.  Okay with continuing medication.  Refills are up to date.  PDMP checked.  Controlled substance agreement and UDS up to date.  Return to clinic in 3 months for reevaluation.  Call sooner if concerns arise.

## 2024-04-08 NOTE — Assessment & Plan Note (Signed)
 Chronic.  Not well controlled.  Patient has tried diet and exercise for weight loss without success.  Will start [facility] 0.25mg  weekly.  Will increase to [facility] 0.5mg  weekly after the first 4 weeks.  Discussed how to inject medication.  Discussed side effects and benefits of medication.  Follow up in 3 months.  Call sooner if concerns arise.

## 2024-04-09 ENCOUNTER — Ambulatory Visit: Payer: Self-pay | Admitting: Nurse Practitioner

## 2024-04-09 LAB — COMPREHENSIVE METABOLIC PANEL WITH GFR
ALT: 13 IU/L (ref 0–32)
AST: 17 IU/L (ref 0–40)
Albumin: 4.5 g/dL (ref 3.9–4.9)
Alkaline Phosphatase: 74 IU/L (ref 44–121)
BUN/Creatinine Ratio: 12 (ref 9–23)
BUN: 10 mg/dL (ref 6–24)
Bilirubin Total: 0.6 mg/dL (ref 0.0–1.2)
CO2: 20 mmol/L (ref 20–29)
Calcium: 9.3 mg/dL (ref 8.7–10.2)
Chloride: 99 mmol/L (ref 96–106)
Creatinine, Ser: 0.81 mg/dL (ref 0.57–1.00)
Globulin, Total: 2.2 g/dL (ref 1.5–4.5)
Glucose: 84 mg/dL (ref 70–99)
Potassium: 4.1 mmol/L (ref 3.5–5.2)
Sodium: 135 mmol/L (ref 134–144)
Total Protein: 6.7 g/dL (ref 6.0–8.5)
eGFR: 88 mL/min/{1.73_m2} (ref >59)

## 2024-04-09 LAB — CBC WITH DIFFERENTIAL/PLATELET
Basophils Absolute: 0.1 10*3/uL (ref 0.0–0.2)
Basos: 1 %
EOS (ABSOLUTE): 0.1 10*3/uL (ref 0.0–0.4)
Eos: 1 %
Hematocrit: 45.8 % (ref 34.0–46.6)
Hemoglobin: 15.1 g/dL (ref 11.1–15.9)
Immature Grans (Abs): 0 10*3/uL (ref 0.0–0.1)
Immature Granulocytes: 0 %
Lymphocytes Absolute: 2.6 10*3/uL (ref 0.7–3.1)
Lymphs: 40 %
MCH: 30.2 pg (ref 26.6–33.0)
MCHC: 33 g/dL (ref 31.5–35.7)
MCV: 92 fL (ref 79–97)
Monocytes Absolute: 0.6 10*3/uL (ref 0.1–0.9)
Monocytes: 10 %
Neutrophils Absolute: 3.2 10*3/uL (ref 1.4–7.0)
Neutrophils: 48 %
Platelets: 288 10*3/uL (ref 150–450)
RBC: 5 x10E6/uL (ref 3.77–5.28)
RDW: 13 % (ref 11.7–15.4)
WBC: 6.6 10*3/uL (ref 3.4–10.8)

## 2024-04-09 LAB — LIPID PANEL
Chol/HDL Ratio: 3 ratio (ref 0.0–4.4)
Cholesterol, Total: 155 mg/dL (ref 100–199)
HDL: 52 mg/dL (ref >39)
LDL Chol Calc (NIH): 88 mg/dL (ref 0–99)
Triglycerides: 79 mg/dL (ref 0–149)
VLDL Cholesterol Cal: 15 mg/dL (ref 5–40)

## 2024-04-09 LAB — TSH: TSH: 2.48 u[IU]/mL (ref 0.450–4.500)

## 2024-04-18 ENCOUNTER — Other Ambulatory Visit: Payer: Self-pay | Admitting: Nurse Practitioner

## 2024-04-18 DIAGNOSIS — F419 Anxiety disorder, unspecified: Secondary | ICD-10-CM

## 2024-04-19 ENCOUNTER — Encounter: Payer: Self-pay | Admitting: Nurse Practitioner

## 2024-04-19 DIAGNOSIS — F419 Anxiety disorder, unspecified: Secondary | ICD-10-CM

## 2024-04-19 NOTE — Telephone Encounter (Signed)
 Requested medications are due for refill today.  no  Requested medications are on the active medications list.  yes  Last refill. 04/08/2024  Future visit scheduled.   yes  Notes to clinic.  Refill not delegated.    Requested Prescriptions  Pending Prescriptions Disp Refills   LORazepam  (ATIVAN ) 1 MG tablet [Pharmacy Med Name: LORAZEPAM  1 MG TABLET] 30 tablet 0    Sig: TAKE 1 TABLET(1 MG) BY MOUTH DAILY AS NEEDED FOR ANXIETY     Not Delegated - Psychiatry: Anxiolytics/Hypnotics 2 Failed - 04/19/2024  4:58 PM      Failed - This refill cannot be delegated      Passed - Urine Drug Screen completed in last 360 days      Passed - Patient is not pregnant      Passed - Valid encounter within last 6 months    Recent Outpatient Visits           1 week ago Annual physical exam   [place] Tammy Alexanders, NP   4 months ago Anxiety   [place] [facility] Tammy Alexanders, NP

## 2024-04-22 MED ORDER — LORAZEPAM 1 MG PO TABS: 1.0000 mg | ORAL_TABLET | Freq: Every day | ORAL | 2 refills | Status: DC | PRN

## 2024-04-28 ENCOUNTER — Encounter: Payer: Self-pay | Admitting: Nurse Practitioner

## 2024-04-30 DIAGNOSIS — I10 Essential (primary) hypertension: Secondary | ICD-10-CM | POA: Diagnosis not present

## 2024-05-01 ENCOUNTER — Other Ambulatory Visit: Payer: Self-pay

## 2024-05-01 MED ORDER — LISDEXAMFETAMINE DIMESYLATE 30 MG PO CAPS
30.0000 mg | ORAL_CAPSULE | Freq: Every day | ORAL | 0 refills | Status: DC
Start: 2024-05-01 — End: 2024-07-11

## 2024-05-14 ENCOUNTER — Telehealth: Payer: Self-pay

## 2024-05-14 NOTE — Telephone Encounter (Signed)
 Pharmacy Patient Advocate Encounter   Received notification from CoverMyMeds that prior authorization for Wegovy  0.25MG /0.5ML auto-injectors is required/requested.   Insurance verification completed.   The patient is insured through [facility] .   Per test claim: PA required; PA submitted to above mentioned insurance via CoverMyMeds Key/confirmation #/EOC [id number] Status is pending

## 2024-05-15 ENCOUNTER — Other Ambulatory Visit (HOSPITAL_COMMUNITY): Payer: Self-pay

## 2024-05-24 ENCOUNTER — Encounter: Payer: Self-pay | Admitting: Nurse Practitioner

## 2024-05-27 NOTE — Telephone Encounter (Signed)
 Pharmacy Patient Advocate Encounter  Received notification from [facility] that Prior Authorization for Wegovy  0.25MG /0.5ML auto-injectors has been DENIED.  Full denial letter will be uploaded to the media tab. See denial reason below.

## 2024-05-28 ENCOUNTER — Ambulatory Visit: Admitting: Nurse Practitioner

## 2024-05-28 ENCOUNTER — Encounter: Payer: Self-pay | Admitting: Nurse Practitioner

## 2024-05-28 VITALS — BP 139/90 | HR 98 | Temp 98.2°F | Ht 64.0 in | Wt 229.4 lb

## 2024-05-28 DIAGNOSIS — R030 Elevated blood-pressure reading, without diagnosis of hypertension: Secondary | ICD-10-CM | POA: Diagnosis not present

## 2024-05-28 NOTE — Progress Notes (Signed)
 BP (!) 139/90   Pulse 98   Temp 98.2 F (36.8 C) (Oral)   Ht 5' 4 (1.626 m)   Wt 229 lb 6.4 oz (104.1 kg)   SpO2 98%   BMI 39.38 kg/m    Subjective:    Patient ID: Tammy Mays, female    DOB: 1975-11-19, 48 y.o.   MRN: [id number]  HPI: Tammy Mays is a 48 y.o. female  Chief Complaint  Patient presents with   Hypertension    Patient states she has been under a lot of stress at work in the last few weeks. States she thinks her BP has been elevated because of this. States she is retiring on Thursday.   Migraine   HYPERTENSION without Chronic Kidney Disease Patient states her blood pressure has been elevated.  She feels like she is getting a migraine and having panic attacks. We decreased her dose of Vyvanse  to 30mg  and her blood pressure was much better.   Hypertension status: uncontrolled  Satisfied with current treatment? no Duration of hypertension: years BP monitoring frequency:  checking daily BP range: 140-150/80-90 BP medication side effects:  no Medication compliance: excellent compliance Previous BP meds:none Aspirin: no Recurrent headaches: yes Visual changes: no Palpitations: no Dyspnea: no Chest pain: no Lower extremity edema: no Dizzy/lightheaded: no  Relevant past medical, surgical, family and social history reviewed and updated as indicated. Interim medical history since our last visit reviewed. Allergies and medications reviewed and updated.  Review of Systems  Eyes:  Negative for visual disturbance.  Respiratory:  Negative for cough, chest tightness and shortness of breath.   Cardiovascular:  Negative for chest pain, palpitations and leg swelling.  Neurological:  Positive for headaches. Negative for dizziness.  Psychiatric/Behavioral:  The patient is nervous/anxious.     Per HPI unless specifically indicated above     Objective:    BP (!) 139/90   Pulse 98   Temp 98.2 F (36.8 C) (Oral)   Ht 5' 4 (1.626 m)   Wt 229 lb 6.4 oz (104.1  kg)   SpO2 98%   BMI 39.38 kg/m   Wt Readings from Last 3 Encounters:  05/28/24 229 lb 6.4 oz (104.1 kg)  04/08/24 236 lb 6.4 oz (107.2 kg)  12/20/23 230 lb (104.3 kg)    Physical Exam Vitals and nursing note reviewed.  Constitutional:      General: She is not in acute distress.    Appearance: Normal appearance. She is not ill-appearing, toxic-appearing or diaphoretic.  HENT:     Head: Normocephalic.     Right Ear: External ear normal.     Left Ear: External ear normal.     Nose: Nose normal.     Mouth/Throat:     Mouth: Mucous membranes are moist.     Pharynx: Oropharynx is clear.  Eyes:     General:        Right eye: No discharge.        Left eye: No discharge.     Extraocular Movements: Extraocular movements intact.     Conjunctiva/sclera: Conjunctivae normal.     Pupils: Pupils are equal, round, and reactive to light.  Cardiovascular:     Rate and Rhythm: Normal rate and regular rhythm.     Heart sounds: No murmur heard. Pulmonary:     Effort: Pulmonary effort is normal. No respiratory distress.     Breath sounds: Normal breath sounds. No wheezing or rales.  Musculoskeletal:     Cervical  back: Normal range of motion and neck supple.  Skin:    General: Skin is warm and dry.     Capillary Refill: Capillary refill takes less than 2 seconds.  Neurological:     General: No focal deficit present.     Mental Status: She is alert and oriented to person, place, and time. Mental status is at baseline.  Psychiatric:        Mood and Affect: Mood normal.        Behavior: Behavior normal.        Thought Content: Thought content normal.        Judgment: Judgment normal.     Results for orders placed or performed in visit on 04/08/24  CBC with Differential/Platelet   Collection Time: 04/08/24 11:26 AM  Result Value Ref Range   WBC 6.6 3.4 - 10.8 x10E3/uL   RBC 5.00 3.77 - 5.28 x10E6/uL   Hemoglobin 15.1 11.1 - 15.9 g/dL   Hematocrit 54.1 65.9 - 46.6 %   MCV 92 79 - 97  fL   MCH 30.2 26.6 - 33.0 pg   MCHC 33.0 31.5 - 35.7 g/dL   RDW 86.9 88.2 - 84.5 %   Platelets 288 150 - 450 x10E3/uL   Neutrophils 48 Not Estab. %   Lymphs 40 Not Estab. %   Monocytes 10 Not Estab. %   Eos 1 Not Estab. %   Basos 1 Not Estab. %   Neutrophils Absolute 3.2 1.4 - 7.0 x10E3/uL   Lymphocytes Absolute 2.6 0.7 - 3.1 x10E3/uL   Monocytes Absolute 0.6 0.1 - 0.9 x10E3/uL   EOS (ABSOLUTE) 0.1 0.0 - 0.4 x10E3/uL   Basophils Absolute 0.1 0.0 - 0.2 x10E3/uL   Immature Granulocytes 0 Not Estab. %   Immature Grans (Abs) 0.0 0.0 - 0.1 x10E3/uL  Comprehensive metabolic panel with GFR   Collection Time: 04/08/24 11:26 AM  Result Value Ref Range   Glucose 84 70 - 99 mg/dL   BUN 10 6 - 24 mg/dL   Creatinine, Ser 9.18 0.57 - 1.00 mg/dL   eGFR 88 >40 fO/fpw/8.26   BUN/Creatinine Ratio 12 9 - 23   Sodium 135 134 - 144 mmol/L   Potassium 4.1 3.5 - 5.2 mmol/L   Chloride 99 96 - 106 mmol/L   CO2 20 20 - 29 mmol/L   Calcium 9.3 8.7 - 10.2 mg/dL   Total Protein 6.7 6.0 - 8.5 g/dL   Albumin 4.5 3.9 - 4.9 g/dL   Globulin, Total 2.2 1.5 - 4.5 g/dL   Bilirubin Total 0.6 0.0 - 1.2 mg/dL   Alkaline Phosphatase 74 44 - 121 IU/L   AST 17 0 - 40 IU/L   ALT 13 0 - 32 IU/L  Lipid panel   Collection Time: 04/08/24 11:26 AM  Result Value Ref Range   Cholesterol, Total 155 100 - 199 mg/dL   Triglycerides 79 0 - 149 mg/dL   HDL 52 >60 mg/dL   VLDL Cholesterol Cal 15 5 - 40 mg/dL   LDL Chol Calc (NIH) 88 0 - 99 mg/dL   Chol/HDL Ratio 3.0 0.0 - 4.4 ratio  TSH   Collection Time: 04/08/24 11:26 AM  Result Value Ref Range   TSH 2.480 0.450 - 4.500 uIU/mL      Assessment & Plan:   Problem List Items Addressed This Visit       Cardiovascular and Mediastinum   Elevated blood pressure, situational - Primary   Has been elevated over the last several  months.  Vyvanse  was decreased to 30mg  and blood pressure improved.  In the last two weeks blood pressure was elevated again due to stress with  work.  Patient retires in 2 days.  Will follow up next week to reassess blood pressure.  Continue to check at home.        Follow up plan: Return in about 10 days (around 06/07/2024) for 1120 next Thursday 8/7.

## 2024-05-28 NOTE — Assessment & Plan Note (Signed)
 Has been elevated over the last several months.  Vyvanse  was decreased to 30mg  and blood pressure improved.  In the last two weeks blood pressure was elevated again due to stress with work.  Patient retires in 2 days.  Will follow up next week to reassess blood pressure.  Continue to check at home.

## 2024-05-29 ENCOUNTER — Other Ambulatory Visit: Payer: Self-pay

## 2024-05-31 DIAGNOSIS — I10 Essential (primary) hypertension: Secondary | ICD-10-CM | POA: Diagnosis not present

## 2024-06-01 ENCOUNTER — Encounter: Payer: Self-pay | Admitting: Nurse Practitioner

## 2024-06-06 ENCOUNTER — Ambulatory Visit: Admitting: Nurse Practitioner

## 2024-06-14 ENCOUNTER — Encounter: Payer: Self-pay | Admitting: Nurse Practitioner

## 2024-06-14 DIAGNOSIS — F909 Attention-deficit hyperactivity disorder, unspecified type: Secondary | ICD-10-CM

## 2024-06-14 NOTE — Telephone Encounter (Signed)
 Will need to wait for Tammy Mays's return on Monday

## 2024-06-17 MED ORDER — LISDEXAMFETAMINE DIMESYLATE 30 MG PO CAPS: 30.0000 mg | ORAL_CAPSULE | Freq: Every day | ORAL | 0 refills | Status: DC

## 2024-06-23 ENCOUNTER — Encounter: Payer: Self-pay | Admitting: Nurse Practitioner

## 2024-06-24 ENCOUNTER — Other Ambulatory Visit: Payer: Self-pay

## 2024-06-24 MED ORDER — WEGOVY 1 MG/0.5ML ~~LOC~~ SOAJ
1.0000 mg | SUBCUTANEOUS | 2 refills | Status: DC
  Filled 2024-06-24 – 2024-06-28 (×2): qty 2, 28d supply, fill #0
  Filled 2024-07-29: qty 2, 28d supply, fill #1

## 2024-06-27 ENCOUNTER — Other Ambulatory Visit (HOSPITAL_COMMUNITY): Payer: Self-pay

## 2024-06-28 ENCOUNTER — Other Ambulatory Visit: Payer: Self-pay

## 2024-07-01 DIAGNOSIS — I10 Essential (primary) hypertension: Secondary | ICD-10-CM | POA: Diagnosis not present

## 2024-07-02 ENCOUNTER — Other Ambulatory Visit (HOSPITAL_COMMUNITY): Payer: Self-pay

## 2024-07-11 ENCOUNTER — Ambulatory Visit: Admitting: Nurse Practitioner

## 2024-07-11 ENCOUNTER — Encounter: Payer: Self-pay | Admitting: Nurse Practitioner

## 2024-07-11 VITALS — BP 119/83 | HR 82 | Temp 98.2°F | Ht 64.0 in | Wt 221.8 lb

## 2024-07-11 DIAGNOSIS — F419 Anxiety disorder, unspecified: Secondary | ICD-10-CM

## 2024-07-11 DIAGNOSIS — R030 Elevated blood-pressure reading, without diagnosis of hypertension: Secondary | ICD-10-CM | POA: Diagnosis not present

## 2024-07-11 DIAGNOSIS — Z6841 Body Mass Index (BMI) 40.0 and over, adult: Secondary | ICD-10-CM | POA: Diagnosis not present

## 2024-07-11 DIAGNOSIS — F909 Attention-deficit hyperactivity disorder, unspecified type: Secondary | ICD-10-CM | POA: Diagnosis not present

## 2024-07-11 MED ORDER — LORAZEPAM 1 MG PO TABS: 1.0000 mg | ORAL_TABLET | Freq: Every day | ORAL | 2 refills | Status: DC | PRN

## 2024-07-11 MED ORDER — LISDEXAMFETAMINE DIMESYLATE 20 MG PO CAPS: 20.0000 mg | ORAL_CAPSULE | Freq: Every day | ORAL | 0 refills | Status: DC

## 2024-07-11 NOTE — Progress Notes (Signed)
 BP 119/83 (BP Location: Right Arm, Cuff Size: Large)   Pulse 82   Temp 98.2 F (36.8 C) (Oral)   Ht 5' 4 (1.626 m)   Wt 221 lb 12.8 oz (100.6 kg)   SpO2 99%   BMI 38.07 kg/m    Subjective:    Patient ID: Tammy Mays, female    DOB: [birth year], 48 y.o.   MRN: [id number]  HPI: Tammy Mays is a 48 y.o. female  Chief Complaint  Patient presents with   Anxiety   ADHD   Obesity   ADHD FOLLOW UP She would like to decrease her vyvanse  down to 20mg .  She has retired from her job but does have another one and still needs some concentration.  ADHD status: stable- well controlled. Satisfied with current therapy: yes Medication compliance:  excellent compliance Controlled substance contract: yes Previous psychiatry evaluation: no Previous medications: no vyvanse  (lisdexamfethamine)   Taking meds on weekends/vacations: yes Work/school performance:  good Difficulty sustaining attention/completing tasks: no Distracted by extraneous stimuli: no Does not listen when spoken to: no  Fidgets with hands or feet: no Unable to stay in seat: no Blurts out/interrupts others: no ADHD Medication Side Effects: no    Decreased appetite: no    Headache: no    Sleeping disturbance pattern: no    Irritability: no    Rebound effects (worse than baseline) off medication: no    Anxiousness: no    Dizziness: no    Tics: no   ANXIETY/STRESS Duration:controlled- denies concerns regarding anxiety today. Well controlled with current medication. Anxious mood: yes  Excessive worrying: yes Irritability: no  Sweating: no Nausea: no Palpitations:no Hyperventilation: no Panic attacks: no Agoraphobia: no  Obscessions/compulsions: no Depressed mood: no     12/13/2022    9:05 AM 09/12/2022    4:53 PM 06/02/2022    3:41 PM 03/09/2022    1:27 PM 11/11/2021   10:41 AM  Depression screen PHQ 2/9  Decreased Interest 0 0 0 0 0  Down, Depressed, Hopeless 0 0 0 0 0  PHQ - 2 Score 0 0 0 0 0  Altered  sleeping 1 2 1 2 3   Tired, decreased energy 0 0 0 0 1  Change in appetite 0 0 0 0 1  Feeling bad or failure about yourself  0 0 0 0 0  Trouble concentrating 1 1 0 1 0  Moving slowly or fidgety/restless 0 0 0 0 0  Suicidal thoughts 0 0 0 0 0  PHQ-9 Score 2 3 1 3 5   Difficult doing work/chores Not difficult at all Somewhat difficult Not difficult at all Not difficult at all Somewhat difficult    Anhedonia: no Weight changes: no Insomnia: no hard to stay asleep  Hypersomnia: no Fatigue/loss of energy: yes Feelings of worthlessness: no Feelings of guilt: no Impaired concentration/indecisiveness: no Suicidal ideations: no  Crying spells: no Recent Stressors/Life Changes: no   Relationship problems: no   Family stress: no     Financial stress: no    Job stress: no    Recent death/loss: no  WEIGHT MANAGEMENT Duration: She is down to 15lbs since going on Wegovy .  Not having any side effects.  She just started the 1mg  dose and doesn't feel like she needs to increase at this time.   ELEVATED BLOOD PRESSURE Doing well since she cut her vyvanse  down to 30mg  and retired from her job.  Checks at home and blood pressures are 120/80. Recurrent headaches: no Visual  changes: no Palpitations: no  Dyspnea: no Chest pain: no Lower extremity edema: no Dizzy/lightheaded: no Transient ischemic attacks: no  Relevant past medical, surgical, family and social history reviewed and updated as indicated. Interim medical history since our last visit reviewed. Allergies and medications reviewed and updated.  Review of Systems  Eyes:  Negative for visual disturbance.  Respiratory:  Negative for cough, chest tightness and shortness of breath.   Cardiovascular:  Negative for chest pain, palpitations and leg swelling.  Neurological:  Negative for dizziness and headaches.  Psychiatric/Behavioral:  Positive for decreased concentration.     Per HPI unless specifically indicated above     Objective:     BP 119/83 (BP Location: Right Arm, Cuff Size: Large)   Pulse 82   Temp 98.2 F (36.8 C) (Oral)   Ht 5' 4 (1.626 m)   Wt 221 lb 12.8 oz (100.6 kg)   SpO2 99%   BMI 38.07 kg/m   Wt Readings from Last 3 Encounters:  07/11/24 221 lb 12.8 oz (100.6 kg)  05/28/24 229 lb 6.4 oz (104.1 kg)  04/08/24 236 lb 6.4 oz (107.2 kg)    Physical Exam Vitals and nursing note reviewed.  Constitutional:      General: She is not in acute distress.    Appearance: Normal appearance. She is not ill-appearing, toxic-appearing or diaphoretic.  HENT:     Head: Normocephalic.     Right Ear: External ear normal.     Left Ear: External ear normal.     Nose: Nose normal.     Mouth/Throat:     Mouth: Mucous membranes are moist.     Pharynx: Oropharynx is clear.  Eyes:     General:        Right eye: No discharge.        Left eye: No discharge.     Extraocular Movements: Extraocular movements intact.     Conjunctiva/sclera: Conjunctivae normal.     Pupils: Pupils are equal, round, and reactive to light.  Cardiovascular:     Rate and Rhythm: Normal rate and regular rhythm.     Heart sounds: No murmur heard. Pulmonary:     Effort: Pulmonary effort is normal. No respiratory distress.     Breath sounds: Normal breath sounds. No wheezing or rales.  Musculoskeletal:     Cervical back: Normal range of motion and neck supple.  Skin:    General: Skin is warm and dry.     Capillary Refill: Capillary refill takes less than 2 seconds.  Neurological:     General: No focal deficit present.     Mental Status: She is alert and oriented to person, place, and time. Mental status is at baseline.  Psychiatric:        Mood and Affect: Mood normal.        Behavior: Behavior normal.        Thought Content: Thought content normal.        Judgment: Judgment normal.     Results for orders placed or performed in visit on 04/08/24  CBC with Differential/Platelet   Collection Time: 04/08/24 11:26 AM  Result Value  Ref Range   WBC 6.6 3.4 - 10.8 x10E3/uL   RBC 5.00 3.77 - 5.28 x10E6/uL   Hemoglobin 15.1 11.1 - 15.9 g/dL   Hematocrit 54.1 65.9 - 46.6 %   MCV 92 79 - 97 fL   MCH 30.2 26.6 - 33.0 pg   MCHC 33.0 31.5 - 35.7 g/dL  RDW 13.0 11.7 - 15.4 %   Platelets 288 150 - 450 x10E3/uL   Neutrophils 48 Not Estab. %   Lymphs 40 Not Estab. %   Monocytes 10 Not Estab. %   Eos 1 Not Estab. %   Basos 1 Not Estab. %   Neutrophils Absolute 3.2 1.4 - 7.0 x10E3/uL   Lymphocytes Absolute 2.6 0.7 - 3.1 x10E3/uL   Monocytes Absolute 0.6 0.1 - 0.9 x10E3/uL   EOS (ABSOLUTE) 0.1 0.0 - 0.4 x10E3/uL   Basophils Absolute 0.1 0.0 - 0.2 x10E3/uL   Immature Granulocytes 0 Not Estab. %   Immature Grans (Abs) 0.0 0.0 - 0.1 x10E3/uL  Comprehensive metabolic panel with GFR   Collection Time: 04/08/24 11:26 AM  Result Value Ref Range   Glucose 84 70 - 99 mg/dL   BUN 10 6 - 24 mg/dL   Creatinine, Ser 9.18 0.57 - 1.00 mg/dL   eGFR 88 >40 fO/fpw/8.26   BUN/Creatinine Ratio 12 9 - 23   Sodium 135 134 - 144 mmol/L   Potassium 4.1 3.5 - 5.2 mmol/L   Chloride 99 96 - 106 mmol/L   CO2 20 20 - 29 mmol/L   Calcium 9.3 8.7 - 10.2 mg/dL   Total Protein 6.7 6.0 - 8.5 g/dL   Albumin 4.5 3.9 - 4.9 g/dL   Globulin, Total 2.2 1.5 - 4.5 g/dL   Bilirubin Total 0.6 0.0 - 1.2 mg/dL   Alkaline Phosphatase 74 44 - 121 IU/L   AST 17 0 - 40 IU/L   ALT 13 0 - 32 IU/L  Lipid panel   Collection Time: 04/08/24 11:26 AM  Result Value Ref Range   Cholesterol, Total 155 100 - 199 mg/dL   Triglycerides 79 0 - 149 mg/dL   HDL 52 >60 mg/dL   VLDL Cholesterol Cal 15 5 - 40 mg/dL   LDL Chol Calc (NIH) 88 0 - 99 mg/dL   Chol/HDL Ratio 3.0 0.0 - 4.4 ratio  TSH   Collection Time: 04/08/24 11:26 AM  Result Value Ref Range   TSH 2.480 0.450 - 4.500 uIU/mL      Assessment & Plan:   Problem List Items Addressed This Visit       Cardiovascular and Mediastinum   Elevated blood pressure, situational   Chronic.  Controlled.  Continue with  current medication regimen.  Continue to check blood pressure at home and bring log to next visit.  Return to clinic in 6 months for reevaluation.  Call sooner if concerns arise.          Other   Attention deficit hyperactivity disorder   Chronic, ongoing, stable Patient would like to decrease dose to Vyvanse  20mg . Will send in 30 day supply.  Patient will reach out and decide whether 20mg  is a good dose, if not can increase back to 30mg  for next refill. Will send Vyvanse  refills x 3 month supply today PDMP reviewed during visit UDS and controlled substance agreement need to be updated Follow up in 3 months or sooner if concerns arise       Anxiety   Chronic.  Controlled.  Continue with current medication regimen.  Aware of risks of long term Benzodiazepine use.  Okay with continuing medication.  Refills are up to date.  PDMP checked.  Controlled substance agreement and UDS up to date.  Return to clinic in 3 months for reevaluation.  Call sooner if concerns arise.       Relevant Medications   LORazepam  (ATIVAN ) 1  MG tablet   BMI 40.0-44.9, adult (HCC) - Primary   Chronic.  Improved. Patient has lost 15lbs since starting Wegovy .   Patient has tried diet and exercise for weight loss without success.  Will start Wegovy  0.25mg  weekly.  Continue with 1mg  dose at this time.  Discussed side effects and benefits of medication.  Follow up in 3 months.  Call sooner if concerns arise.        Relevant Medications   lisdexamfetamine (VYVANSE ) 20 MG capsule     Follow up plan: Return in about 3 months (around 10/10/2024) for ADHD FU, Medication Management.

## 2024-07-11 NOTE — Assessment & Plan Note (Signed)
 Chronic.  Improved. Patient has lost 15lbs since starting Wegovy .   Patient has tried diet and exercise for weight loss without success.  Will start Wegovy  0.25mg  weekly.  Continue with 1mg  dose at this time.  Discussed side effects and benefits of medication.  Follow up in 3 months.  Call sooner if concerns arise.

## 2024-07-11 NOTE — Assessment & Plan Note (Signed)
 Chronic, ongoing, stable Patient would like to decrease dose to Vyvanse  20mg . Will send in 30 day supply.  Patient will reach out and decide whether 20mg  is a good dose, if not can increase back to 30mg  for next refill. Will send Vyvanse  refills x 3 month supply today PDMP reviewed during visit UDS and controlled substance agreement need to be updated Follow up in 3 months or sooner if concerns arise

## 2024-07-11 NOTE — Assessment & Plan Note (Signed)
 Chronic.  Controlled.  Continue with current medication regimen.  Continue to check blood pressure at home and bring log to next visit.  Return to clinic in 6 months for reevaluation.  Call sooner if concerns arise.

## 2024-07-11 NOTE — Assessment & Plan Note (Signed)
 Chronic.  Controlled.  Continue with current medication regimen.  Aware of risks of long term Benzodiazepine use.  Okay with continuing medication.  Refills are up to date.  PDMP checked.  Controlled substance agreement and UDS up to date.  Return to clinic in 3 months for reevaluation.  Call sooner if concerns arise.

## 2024-07-30 ENCOUNTER — Other Ambulatory Visit: Payer: Self-pay

## 2024-07-31 DIAGNOSIS — I10 Essential (primary) hypertension: Secondary | ICD-10-CM | POA: Diagnosis not present

## 2024-08-16 ENCOUNTER — Encounter: Payer: Self-pay | Admitting: Nurse Practitioner

## 2024-08-19 ENCOUNTER — Other Ambulatory Visit: Payer: Self-pay

## 2024-08-19 MED ORDER — WEGOVY 1.7 MG/0.75ML ~~LOC~~ SOAJ
1.7000 mg | SUBCUTANEOUS | 2 refills | Status: DC
  Filled 2024-08-19: qty 3, 28d supply, fill #0
  Filled 2024-09-15: qty 3, 28d supply, fill #1
  Filled 2024-10-09: qty 3, 28d supply, fill #2

## 2024-08-26 ENCOUNTER — Other Ambulatory Visit: Payer: Self-pay | Admitting: Nurse Practitioner

## 2024-08-28 NOTE — Telephone Encounter (Signed)
 Requested medication (s) are due for refill today: yes  Requested medication (s) are on the active medication list: yes  Last refill:  07/11/24  Future visit scheduled: {Yes  Notes to clinic:  Unable to refill per protocol, cannot delegate.      Requested Prescriptions  Pending Prescriptions Disp Refills   lisdexamfetamine (VYVANSE ) 20 MG capsule [Pharmacy Med Name: LISDEXAMFETAMINE 20 MG CAPSULE] 30 capsule 0    Sig: Take 1 capsule (20 mg total) by mouth daily.     Not Delegated - Psychiatry:  Stimulants/ADHD Failed - 08/28/2024  9:15 AM      Failed - This refill cannot be delegated      Passed - Urine Drug Screen completed in last 360 days      Passed - Last BP in normal range    BP Readings from Last 1 Encounters:  07/11/24 119/83         Passed - Last Heart Rate in normal range    Pulse Readings from Last 1 Encounters:  07/11/24 82         Passed - Valid encounter within last 6 months    Recent Outpatient Visits           1 month ago BMI 40.0-44.9, adult [facility])   [place] [facility] Melvin Pao, NP   3 months ago Elevated blood pressure, situational   [place] [facility] Melvin Pao, NP   4 months ago Annual physical exam   [place] [facility] Melvin Pao, NP   8 months ago Anxiety   [place] [facility] Melvin Pao, NP

## 2024-08-31 DIAGNOSIS — I10 Essential (primary) hypertension: Secondary | ICD-10-CM | POA: Diagnosis not present

## 2024-09-15 ENCOUNTER — Other Ambulatory Visit: Payer: Self-pay

## 2024-09-20 ENCOUNTER — Encounter: Payer: Self-pay | Admitting: Nurse Practitioner

## 2024-09-20 ENCOUNTER — Ambulatory Visit: Admitting: Nurse Practitioner

## 2024-09-20 VITALS — BP 138/89 | HR 85 | Temp 98.1°F | Ht 64.02 in | Wt 220.6 lb

## 2024-09-20 DIAGNOSIS — F909 Attention-deficit hyperactivity disorder, unspecified type: Secondary | ICD-10-CM

## 2024-09-20 DIAGNOSIS — F419 Anxiety disorder, unspecified: Secondary | ICD-10-CM | POA: Diagnosis not present

## 2024-09-20 DIAGNOSIS — Z6841 Body Mass Index (BMI) 40.0 and over, adult: Secondary | ICD-10-CM

## 2024-09-20 MED ORDER — LISDEXAMFETAMINE DIMESYLATE 20 MG PO CAPS: 20.0000 mg | ORAL_CAPSULE | Freq: Every day | ORAL | 0 refills | Status: AC

## 2024-09-20 MED ORDER — LORAZEPAM 1 MG PO TABS: 1.0000 mg | ORAL_TABLET | Freq: Every day | ORAL | 2 refills | Status: AC | PRN

## 2024-09-20 NOTE — Assessment & Plan Note (Signed)
 Chronic.  Controlled.  Continue with current medication regimen.  Aware of risks of long term Benzodiazepine use.  Okay with continuing medication.  Refills are up to date.  PDMP checked.  Controlled substance agreement and UDS need to be updated.  Return to clinic in 3 months for reevaluation.  Call sooner if concerns arise.

## 2024-09-20 NOTE — Progress Notes (Signed)
 BP 138/89 (BP Location: Right Arm, Patient Position: Sitting, Cuff Size: Large)   Pulse 85   Temp 98.1 F (36.7 C) (Oral)   Ht 5' 4.02 (1.626 m)   Wt 220 lb 9.6 oz (100.1 kg)   SpO2 97%   BMI 37.85 kg/m    Subjective:    Patient ID: Tammy Mays, female    DOB: 13-Dec-1975, 48 y.o.   MRN: [id number]  HPI: Tammy Mays is a 48 y.o. female  Chief Complaint  Patient presents with   ADHD    F/u   ADHD FOLLOW UP She would like to decrease her vyvanse  down to 20mg .  She has retired from her job but does have another one and still needs some concentration.  ADHD status: stable- well controlled. Satisfied with current therapy: yes Medication compliance:  excellent compliance Controlled substance contract: yes Previous psychiatry evaluation: no Previous medications: no vyvanse  (lisdexamfethamine)   Taking meds on weekends/vacations: yes Work/school performance:  good Difficulty sustaining attention/completing tasks: no Distracted by extraneous stimuli: no Does not listen when spoken to: no  Fidgets with hands or feet: no Unable to stay in seat: no Blurts out/interrupts others: no ADHD Medication Side Effects: no    Decreased appetite: no    Headache: no    Sleeping disturbance pattern: no    Irritability: no    Rebound effects (worse than baseline) off medication: no    Anxiousness: no    Dizziness: no    Tics: no    WEIGHT MANAGEMENT Duration: She is down to 16bs since going on Wegovy .  Not having any side effects.  She is currently on 1.7mg  of Wegovy .  She feels like she is still adjusting to the medication.    ELEVATED BLOOD PRESSURE Doing well since she cut her vyvanse  down to 30mg  and retired from her job.  Checks at home and blood pressures are 120/80. Recurrent headaches: no Visual changes: no Palpitations: no  Dyspnea: no Chest pain: no Lower extremity edema: no Dizzy/lightheaded: no Transient ischemic attacks: no  ANXIETY Patient has been taking  Lorazepam  for many years.  Helps her to relax.  Wasn't able to tolerate other medications due to needing to get up in the middle of the night for work.  The Lorazepam  helped her not be too groggy.   Relevant past medical, surgical, family and social history reviewed and updated as indicated. Interim medical history since our last visit reviewed. Allergies and medications reviewed and updated.  Review of Systems  Eyes:  Negative for visual disturbance.  Respiratory:  Negative for cough, chest tightness and shortness of breath.   Cardiovascular:  Negative for chest pain, palpitations and leg swelling.  Neurological:  Negative for dizziness and headaches.  Psychiatric/Behavioral:  Positive for decreased concentration. The patient is nervous/anxious.     Per HPI unless specifically indicated above     Objective:    BP 138/89 (BP Location: Right Arm, Patient Position: Sitting, Cuff Size: Large)   Pulse 85   Temp 98.1 F (36.7 C) (Oral)   Ht 5' 4.02 (1.626 m)   Wt 220 lb 9.6 oz (100.1 kg)   SpO2 97%   BMI 37.85 kg/m   Wt Readings from Last 3 Encounters:  09/20/24 220 lb 9.6 oz (100.1 kg)  07/11/24 221 lb 12.8 oz (100.6 kg)  05/28/24 229 lb 6.4 oz (104.1 kg)    Physical Exam Vitals and nursing note reviewed.  Constitutional:      General: She is  not in acute distress.    Appearance: Normal appearance. She is obese. She is not ill-appearing, toxic-appearing or diaphoretic.  HENT:     Head: Normocephalic.     Right Ear: External ear normal.     Left Ear: External ear normal.     Nose: Nose normal.     Mouth/Throat:     Mouth: Mucous membranes are moist.     Pharynx: Oropharynx is clear.  Eyes:     General:        Right eye: No discharge.        Left eye: No discharge.     Extraocular Movements: Extraocular movements intact.     Conjunctiva/sclera: Conjunctivae normal.     Pupils: Pupils are equal, round, and reactive to light.  Cardiovascular:     Rate and Rhythm: Normal  rate and regular rhythm.     Heart sounds: No murmur heard. Pulmonary:     Effort: Pulmonary effort is normal. No respiratory distress.     Breath sounds: Normal breath sounds. No wheezing or rales.  Musculoskeletal:     Cervical back: Normal range of motion and neck supple.  Skin:    General: Skin is warm and dry.     Capillary Refill: Capillary refill takes less than 2 seconds.  Neurological:     General: No focal deficit present.     Mental Status: She is alert and oriented to person, place, and time. Mental status is at baseline.  Psychiatric:        Mood and Affect: Mood normal.        Behavior: Behavior normal.        Thought Content: Thought content normal.        Judgment: Judgment normal.     Results for orders placed or performed in visit on 04/08/24  CBC with Differential/Platelet   Collection Time: 04/08/24 11:26 AM  Result Value Ref Range   WBC 6.6 3.4 - 10.8 x10E3/uL   RBC 5.00 3.77 - 5.28 x10E6/uL   Hemoglobin 15.1 11.1 - 15.9 g/dL   Hematocrit 54.1 65.9 - 46.6 %   MCV 92 79 - 97 fL   MCH 30.2 26.6 - 33.0 pg   MCHC 33.0 31.5 - 35.7 g/dL   RDW 86.9 88.2 - 84.5 %   Platelets 288 150 - 450 x10E3/uL   Neutrophils 48 Not Estab. %   Lymphs 40 Not Estab. %   Monocytes 10 Not Estab. %   Eos 1 Not Estab. %   Basos 1 Not Estab. %   Neutrophils Absolute 3.2 1.4 - 7.0 x10E3/uL   Lymphocytes Absolute 2.6 0.7 - 3.1 x10E3/uL   Monocytes Absolute 0.6 0.1 - 0.9 x10E3/uL   EOS (ABSOLUTE) 0.1 0.0 - 0.4 x10E3/uL   Basophils Absolute 0.1 0.0 - 0.2 x10E3/uL   Immature Granulocytes 0 Not Estab. %   Immature Grans (Abs) 0.0 0.0 - 0.1 x10E3/uL  Comprehensive metabolic panel with GFR   Collection Time: 04/08/24 11:26 AM  Result Value Ref Range   Glucose 84 70 - 99 mg/dL   BUN 10 6 - 24 mg/dL   Creatinine, Ser 9.18 0.57 - 1.00 mg/dL   eGFR 88 >40 fO/fpw/8.26   BUN/Creatinine Ratio 12 9 - 23   Sodium 135 134 - 144 mmol/L   Potassium 4.1 3.5 - 5.2 mmol/L   Chloride 99 96 -  106 mmol/L   CO2 20 20 - 29 mmol/L   Calcium 9.3 8.7 - 10.2 mg/dL  Total Protein 6.7 6.0 - 8.5 g/dL   Albumin 4.5 3.9 - 4.9 g/dL   Globulin, Total 2.2 1.5 - 4.5 g/dL   Bilirubin Total 0.6 0.0 - 1.2 mg/dL   Alkaline Phosphatase 74 44 - 121 IU/L   AST 17 0 - 40 IU/L   ALT 13 0 - 32 IU/L  Lipid panel   Collection Time: 04/08/24 11:26 AM  Result Value Ref Range   Cholesterol, Total 155 100 - 199 mg/dL   Triglycerides 79 0 - 149 mg/dL   HDL 52 >60 mg/dL   VLDL Cholesterol Cal 15 5 - 40 mg/dL   LDL Chol Calc (NIH) 88 0 - 99 mg/dL   Chol/HDL Ratio 3.0 0.0 - 4.4 ratio  TSH   Collection Time: 04/08/24 11:26 AM  Result Value Ref Range   TSH 2.480 0.450 - 4.500 uIU/mL      Assessment & Plan:   Problem List Items Addressed This Visit       Other   Attention deficit hyperactivity disorder   Chronic, ongoing, stable Continue with Vyvanse  20mg . W Will send Vyvanse  refills x 3 month supply today PDMP reviewed during visit UDS and controlled substance agreement need to be updated Follow up in 3 months or sooner if concerns arise       Anxiety   Chronic.  Controlled.  Continue with current medication regimen.  Aware of risks of long term Benzodiazepine use.  Okay with continuing medication.  Refills are up to date.  PDMP checked.  Controlled substance agreement and UDS need to be updated.  Return to clinic in 3 months for reevaluation.  Call sooner if concerns arise.       Relevant Medications   LORazepam  (ATIVAN ) 1 MG tablet   BMI 40.0-44.9, adult (HCC) - Primary   Chronic.  Improved. Patient has lost 16lbs since starting Wegovy .   Patient has tried diet and exercise for weight loss without success.   Continue with 1.7mg  dose at this time.  Discussed side effects and benefits of medication.  Follow up in 3 months.  Call sooner if concerns arise.       Relevant Medications   lisdexamfetamine (VYVANSE ) 20 MG capsule (Start on 11/28/2024)   lisdexamfetamine (VYVANSE ) 20 MG capsule  (Start on 10/28/2024)   lisdexamfetamine (VYVANSE ) 20 MG capsule (Start on 09/28/2024)      Follow up plan: Return in about 3 months (around 12/21/2024) for ADHD FU and PAP.

## 2024-09-20 NOTE — Assessment & Plan Note (Signed)
 Chronic.  Improved. Patient has lost 16lbs since starting Wegovy .   Patient has tried diet and exercise for weight loss without success.   Continue with 1.7mg  dose at this time.  Discussed side effects and benefits of medication.  Follow up in 3 months.  Call sooner if concerns arise.

## 2024-09-20 NOTE — Assessment & Plan Note (Signed)
 Chronic, ongoing, stable Continue with Vyvanse  20mg . W Will send Vyvanse  refills x 3 month supply today PDMP reviewed during visit UDS and controlled substance agreement need to be updated Follow up in 3 months or sooner if concerns arise

## 2024-10-09 ENCOUNTER — Other Ambulatory Visit: Payer: Self-pay

## 2024-10-14 ENCOUNTER — Ambulatory Visit: Admitting: Nurse Practitioner

## 2024-10-22 ENCOUNTER — Encounter: Payer: Self-pay | Admitting: Nurse Practitioner

## 2024-10-23 MED ORDER — OSELTAMIVIR PHOSPHATE 75 MG PO CAPS: 75.0000 mg | ORAL_CAPSULE | Freq: Every day | ORAL | 0 refills | Status: AC

## 2024-11-16 ENCOUNTER — Encounter: Payer: Self-pay | Admitting: Nurse Practitioner

## 2024-11-18 ENCOUNTER — Other Ambulatory Visit: Payer: Self-pay

## 2024-11-18 MED ORDER — WEGOVY 1.7 MG/0.75ML ~~LOC~~ SOAJ: 1.7000 mg | SUBCUTANEOUS | 2 refills | Status: DC

## 2024-11-18 MED ORDER — WEGOVY 1.7 MG/0.75ML ~~LOC~~ SOAJ
1.7000 mg | SUBCUTANEOUS | 2 refills | Status: AC
  Filled 2024-11-18: qty 3, 28d supply, fill #0

## 2024-11-21 ENCOUNTER — Other Ambulatory Visit: Payer: Self-pay

## 2024-12-17 ENCOUNTER — Ambulatory Visit: Admitting: Nurse Practitioner

## 2024-12-24 ENCOUNTER — Ambulatory Visit: Admitting: Nurse Practitioner
